# Patient Record
Sex: Male | Born: 1945 | Race: White | Hispanic: No | Marital: Married | Smoking: Former smoker
Health system: Southern US, Community
[De-identification: ages and names within clinical notes are randomized; demographics above are authoritative.]

## PROBLEM LIST (undated history)

## (undated) DIAGNOSIS — M25552 Pain in left hip: Secondary | ICD-10-CM

## (undated) DIAGNOSIS — F411 Generalized anxiety disorder: Secondary | ICD-10-CM

## (undated) DIAGNOSIS — N179 Acute kidney failure, unspecified: Secondary | ICD-10-CM

## (undated) DIAGNOSIS — R7302 Impaired glucose tolerance (oral): Secondary | ICD-10-CM

## (undated) DIAGNOSIS — I69154 Hemiplegia and hemiparesis following nontraumatic intracerebral hemorrhage affecting left non-dominant side: Secondary | ICD-10-CM

## (undated) DIAGNOSIS — H918X9 Other specified hearing loss, unspecified ear: Secondary | ICD-10-CM

## (undated) DIAGNOSIS — F329 Major depressive disorder, single episode, unspecified: Secondary | ICD-10-CM

## (undated) DIAGNOSIS — I10 Essential (primary) hypertension: Secondary | ICD-10-CM

## (undated) DIAGNOSIS — K635 Polyp of colon: Secondary | ICD-10-CM

## (undated) DIAGNOSIS — R0989 Other specified symptoms and signs involving the circulatory and respiratory systems: Secondary | ICD-10-CM

## (undated) DIAGNOSIS — R9431 Abnormal electrocardiogram [ECG] [EKG]: Secondary | ICD-10-CM

## (undated) DIAGNOSIS — M19042 Primary osteoarthritis, left hand: Secondary | ICD-10-CM

## (undated) DIAGNOSIS — F32A Depression, unspecified: Secondary | ICD-10-CM

## (undated) DIAGNOSIS — G890 Central pain syndrome: Secondary | ICD-10-CM

## (undated) DIAGNOSIS — E7800 Pure hypercholesterolemia, unspecified: Secondary | ICD-10-CM

## (undated) DIAGNOSIS — Z86010 Personal history of colonic polyps: Secondary | ICD-10-CM

## (undated) DIAGNOSIS — M25532 Pain in left wrist: Secondary | ICD-10-CM

## (undated) DIAGNOSIS — H918X3 Other specified hearing loss, bilateral: Secondary | ICD-10-CM

## (undated) DIAGNOSIS — F419 Anxiety disorder, unspecified: Secondary | ICD-10-CM

## (undated) DIAGNOSIS — M6530 Trigger finger, unspecified finger: Secondary | ICD-10-CM

## (undated) DIAGNOSIS — Z860101 Personal history of adenomatous and serrated colon polyps: Secondary | ICD-10-CM

## (undated) DIAGNOSIS — M720 Palmar fascial fibromatosis [Dupuytren]: Secondary | ICD-10-CM

## (undated) HISTORY — DX: Personal history of colonic polyps: Z86.010

## (undated) HISTORY — DX: Other specified hearing loss, bilateral: H91.8X3

## (undated) HISTORY — DX: Essential (primary) hypertension: I10

## (undated) HISTORY — DX: Palmar fascial fibromatosis (dupuytren): M72.0

## (undated) HISTORY — DX: Pain in left hip: M25.552

## (undated) HISTORY — DX: Pain in left wrist: M25.532

## (undated) HISTORY — DX: Trigger finger, unspecified finger: M65.30

## (undated) HISTORY — DX: Impaired glucose tolerance (oral): R73.02

## (undated) HISTORY — DX: Hemiplegia and hemiparesis following nontraumatic intracerebral hemorrhage affecting left non-dominant side: I69.154

## (undated) HISTORY — DX: Primary osteoarthritis, left hand: M19.042

## (undated) HISTORY — DX: Abnormal electrocardiogram (ECG) (EKG): R94.31

## (undated) HISTORY — DX: Central pain syndrome: G89.0

## (undated) HISTORY — DX: Other specified hearing loss, unspecified ear: H91.8X9

## (undated) HISTORY — DX: Pure hypercholesterolemia, unspecified: E78.00

## (undated) HISTORY — DX: Other specified symptoms and signs involving the circulatory and respiratory systems: R09.89

## (undated) HISTORY — DX: Personal history of adenomatous and serrated colon polyps: Z86.0101

## (undated) HISTORY — DX: Acute kidney failure, unspecified: N17.9

## (undated) HISTORY — DX: Generalized anxiety disorder: F41.1

---

## 2017-12-31 ENCOUNTER — Encounter (HOSPITAL_COMMUNITY): Payer: Self-pay | Admitting: *Deleted

## 2017-12-31 ENCOUNTER — Inpatient Hospital Stay (HOSPITAL_COMMUNITY)
Admission: AD | Admit: 2017-12-31 | Discharge: 2018-01-01 | DRG: 920 | Disposition: A | Discharge status: Home / Self Care | Payer: Medicare Other | Source: Other Acute Inpatient Hospital | Attending: Pulmonary Disease | Admitting: Pulmonary Disease

## 2017-12-31 ENCOUNTER — Other Ambulatory Visit: Payer: Self-pay

## 2017-12-31 DIAGNOSIS — K921 Melena: Secondary | ICD-10-CM | POA: Diagnosis present

## 2017-12-31 DIAGNOSIS — E876 Hypokalemia: Secondary | ICD-10-CM | POA: Diagnosis present

## 2017-12-31 DIAGNOSIS — K91840 Postprocedural hemorrhage and hematoma of a digestive system organ or structure following a digestive system procedure: Principal | ICD-10-CM | POA: Diagnosis present

## 2017-12-31 DIAGNOSIS — W19XXXA Unspecified fall, initial encounter: Secondary | ICD-10-CM | POA: Diagnosis present

## 2017-12-31 DIAGNOSIS — Y838 Other surgical procedures as the cause of abnormal reaction of the patient, or of later complication, without mention of misadventure at the time of the procedure: Secondary | ICD-10-CM | POA: Diagnosis not present

## 2017-12-31 DIAGNOSIS — D62 Acute posthemorrhagic anemia: Secondary | ICD-10-CM | POA: Diagnosis not present

## 2017-12-31 DIAGNOSIS — Z87891 Personal history of nicotine dependence: Secondary | ICD-10-CM | POA: Diagnosis not present

## 2017-12-31 DIAGNOSIS — K922 Gastrointestinal hemorrhage, unspecified: Secondary | ICD-10-CM

## 2017-12-31 DIAGNOSIS — R578 Other shock: Secondary | ICD-10-CM | POA: Diagnosis not present

## 2017-12-31 DIAGNOSIS — E871 Hypo-osmolality and hyponatremia: Secondary | ICD-10-CM | POA: Diagnosis not present

## 2017-12-31 DIAGNOSIS — I10 Essential (primary) hypertension: Secondary | ICD-10-CM | POA: Diagnosis present

## 2017-12-31 DIAGNOSIS — Z888 Allergy status to other drugs, medicaments and biological substances status: Secondary | ICD-10-CM | POA: Diagnosis not present

## 2017-12-31 DIAGNOSIS — Z79899 Other long term (current) drug therapy: Secondary | ICD-10-CM | POA: Diagnosis not present

## 2017-12-31 DIAGNOSIS — R55 Syncope and collapse: Secondary | ICD-10-CM | POA: Diagnosis not present

## 2017-12-31 DIAGNOSIS — E785 Hyperlipidemia, unspecified: Secondary | ICD-10-CM | POA: Diagnosis not present

## 2017-12-31 DIAGNOSIS — R739 Hyperglycemia, unspecified: Secondary | ICD-10-CM | POA: Diagnosis not present

## 2017-12-31 DIAGNOSIS — F419 Anxiety disorder, unspecified: Secondary | ICD-10-CM | POA: Diagnosis present

## 2017-12-31 DIAGNOSIS — S0181XA Laceration without foreign body of other part of head, initial encounter: Secondary | ICD-10-CM | POA: Diagnosis not present

## 2017-12-31 DIAGNOSIS — I615 Nontraumatic intracerebral hemorrhage, intraventricular: Secondary | ICD-10-CM | POA: Diagnosis not present

## 2017-12-31 DIAGNOSIS — I161 Hypertensive emergency: Secondary | ICD-10-CM | POA: Diagnosis not present

## 2017-12-31 DIAGNOSIS — I639 Cerebral infarction, unspecified: Secondary | ICD-10-CM | POA: Diagnosis not present

## 2017-12-31 DIAGNOSIS — I610 Nontraumatic intracerebral hemorrhage in hemisphere, subcortical: Secondary | ICD-10-CM | POA: Diagnosis not present

## 2017-12-31 DIAGNOSIS — F329 Major depressive disorder, single episode, unspecified: Secondary | ICD-10-CM | POA: Diagnosis not present

## 2017-12-31 DIAGNOSIS — E079 Disorder of thyroid, unspecified: Secondary | ICD-10-CM | POA: Diagnosis not present

## 2017-12-31 HISTORY — DX: Anxiety disorder, unspecified: F41.9

## 2017-12-31 HISTORY — DX: Depression, unspecified: F32.A

## 2017-12-31 HISTORY — DX: Major depressive disorder, single episode, unspecified: F32.9

## 2017-12-31 HISTORY — DX: Gastrointestinal hemorrhage, unspecified: K92.2

## 2017-12-31 HISTORY — DX: Polyp of colon: K63.5

## 2017-12-31 LAB — BASIC METABOLIC PANEL
ANION GAP: 10 (ref 5–15)
BUN: 20 mg/dL (ref 6–20)
CALCIUM: 7.9 mg/dL — AB (ref 8.9–10.3)
CO2: 22 mmol/L (ref 22–32)
Chloride: 105 mmol/L (ref 101–111)
Creatinine, Ser: 1.07 mg/dL (ref 0.61–1.24)
GFR calc non Af Amer: >60 mL/min (ref >60)
Glucose, Bld: 122 mg/dL — ABNORMAL HIGH (ref 65–99)
Potassium: 3.6 mmol/L (ref 3.5–5.1)
SODIUM: 137 mmol/L (ref 135–145)

## 2017-12-31 LAB — URINALYSIS, ROUTINE W REFLEX MICROSCOPIC
Bacteria, UA: NONE SEEN
Bilirubin Urine: NEGATIVE
Glucose, UA: NEGATIVE mg/dL
Ketones, ur: NEGATIVE mg/dL
Leukocytes, UA: NEGATIVE
NITRITE: NEGATIVE
Protein, ur: NEGATIVE mg/dL
RBC / HPF: NONE SEEN RBC/hpf (ref 0–5)
SPECIFIC GRAVITY, URINE: 1.031 — AB (ref 1.005–1.030)
WBC UA: NONE SEEN WBC/hpf (ref 0–5)
pH: 7 (ref 5.0–8.0)

## 2017-12-31 LAB — CBC
HCT: 31.1 % — ABNORMAL LOW (ref 39.0–52.0)
HCT: 30.3 % — ABNORMAL LOW (ref 39.0–52.0)
HEMOGLOBIN: 10.2 g/dL — AB (ref 13.0–17.0)
Hemoglobin: 10.3 g/dL — ABNORMAL LOW (ref 13.0–17.0)
MCH: 29 pg (ref 26.0–34.0)
MCH: 30 pg (ref 26.0–34.0)
MCHC: 32.8 g/dL (ref 30.0–36.0)
MCHC: 34 g/dL (ref 30.0–36.0)
MCV: 88.4 fL (ref 78.0–100.0)
MCV: 88.3 fL (ref 78.0–100.0)
Platelets: 269 10*3/uL (ref 150–400)
Platelets: 277 10*3/uL (ref 150–400)
RBC: 3.52 MIL/uL — AB (ref 4.22–5.81)
RBC: 3.43 MIL/uL — ABNORMAL LOW (ref 4.22–5.81)
RDW: 13.3 % (ref 11.5–15.5)
RDW: 13.6 % (ref 11.5–15.5)
WBC: 7.2 10*3/uL (ref 4.0–10.5)
WBC: 6.9 10*3/uL (ref 4.0–10.5)

## 2017-12-31 LAB — MRSA PCR SCREENING: MRSA BY PCR: NEGATIVE

## 2017-12-31 MED ORDER — SODIUM CHLORIDE 0.9 % IV SOLN
INTRAVENOUS | Status: DC
  Administered 2017-12-31: 13:00:00 via INTRAVENOUS

## 2017-12-31 MED ORDER — ACETAMINOPHEN 325 MG PO TABS: 650.0000 mg | ORAL_TABLET | Freq: Four times a day (QID) | ORAL | Status: DC | PRN

## 2017-12-31 MED ORDER — LORAZEPAM 1 MG PO TABS: 1.0000 mg | ORAL_TABLET | Freq: Four times a day (QID) | ORAL | Status: DC | PRN

## 2017-12-31 MED ORDER — ONDANSETRON HCL 4 MG/2ML IJ SOLN: 4.0000 mg | Freq: Four times a day (QID) | INTRAMUSCULAR | Status: DC | PRN

## 2017-12-31 MED ORDER — SODIUM CHLORIDE 0.9 % IV SOLN: 250.0000 mL | INTRAVENOUS | Status: DC | PRN

## 2017-12-31 MED ORDER — PANTOPRAZOLE SODIUM 40 MG PO TBEC
40.0000 mg | DELAYED_RELEASE_TABLET | Freq: Every day | ORAL | Status: DC
  Administered 2017-12-31: 40 mg via ORAL
  Filled 2017-12-31: qty 1

## 2017-12-31 MED ORDER — VENLAFAXINE HCL ER 150 MG PO CP24
300.0000 mg | ORAL_CAPSULE | Freq: Every day | ORAL | Status: DC
  Administered 2017-12-31: 300 mg via ORAL
  Filled 2017-12-31 (×2): qty 2

## 2017-12-31 MED ORDER — ALBUTEROL SULFATE (2.5 MG/3ML) 0.083% IN NEBU: 2.5000 mg | INHALATION_SOLUTION | RESPIRATORY_TRACT | Status: DC | PRN

## 2017-12-31 MED ORDER — DOUBLE ANTIBIOTIC 500-10000 UNIT/GM EX OINT
TOPICAL_OINTMENT | Freq: Two times a day (BID) | CUTANEOUS | Status: DC
  Administered 2017-12-31: 1 via TOPICAL
  Administered 2018-01-01: 06:00:00 via TOPICAL
  Filled 2017-12-31: qty 28.4

## 2017-12-31 MED ORDER — BUPROPION HCL ER (XL) 300 MG PO TB24
300.0000 mg | ORAL_TABLET | Freq: Every day | ORAL | Status: DC
  Administered 2017-12-31: 300 mg via ORAL
  Filled 2017-12-31 (×2): qty 1

## 2017-12-31 NOTE — H&P (Addendum)
PULMONARY / CRITICAL CARE MEDICINE   Name: Patrick Pollard MRN: [id number] DOB: December 20, 1945    ADMISSION DATE:  12/31/2017 CONSULTATION DATE:  12/31/17  REFERRING MD:  [facility]   CHIEF COMPLAINT:  Bloody BM, Syncopal Episode   HISTORY OF PRESENT ILLNESS:   72 y/o M, former smoker,  who presented to [facility] on 3/24 after a syncopal episode and large bloody bowel movement.   On 3/13, the patient underwent a colonoscopy at the [facility] with polyp removal.  Post procedure, he had been giving himself enemas for BM, most recent the day of presentation.  He got up to go to the restroom during the night and passed out face first hitting his head on the ground.  He then proceeded to have a large bloody BM > EMS estimated approximately 1-2L blood on the floor (??).  He was awake / alert on presentation. Evaluation in the ER there included a CT of the head / neck which was negative.  In addition, he had a CT of the ABD/Pelvis w/wo contrast that showed no active extravasation to localize site of GIB, mild aortic atherosclerosis without dissection.  Mild colonic wall thickening of the ascending and splenic flexure of the colon suggesting mild colitis, mild urinary bladder wall thickening which may be chronic secondary to prostatic hypertrophy or seen with UTI.  The patients Hgb was 10.6 on arrival.  He was normotensive and not tachycardic.  His facial lacerations were sutured at [facility]. He was given 1 unit PRBC.  The patient was transferred to [facility] ICU for further evaluation.   PAST MEDICAL HISTORY :  He  has no past medical history on file.  PAST SURGICAL HISTORY: He  has no past surgical history on file.  No Known Allergies  No current facility-administered medications on file prior to encounter.    No current outpatient medications on file prior to encounter.    FAMILY HISTORY:  His has no family status information on file.    SOCIAL HISTORY: He    REVIEW OF SYSTEMS:   POSITIVES IN BOLD Gen: Denies fever, chills, weight change, fatigue, night sweats HEENT: Denies blurred vision, double vision, hearing loss, tinnitus, sinus congestion, rhinorrhea, sore throat, neck stiffness, dysphagia PULM: Denies shortness of breath, cough, sputum production, hemoptysis, wheezing CV: Denies chest pain, edema, orthopnea, paroxysmal nocturnal dyspnea, palpitations.  Syncope x2  GI: Denies abdominal pain, nausea, vomiting, diarrhea, hematochezia, melena, constipation, change in bowel habits.  Estimated 1-2L blood loss, pt unsure if bright red or dark but states it had large clots in the blood. GU: Denies dysuria, hematuria, polyuria, oliguria, urethral discharge Endocrine: Denies hot or cold intolerance, polyuria, polyphagia or appetite change Derm: Denies rash, dry skin, scaling or peeling skin change Heme: Denies easy bruising, bleeding, bleeding gums Neuro: Denies headache, numbness, weakness, slurred speech, loss of memory or consciousness   SUBJECTIVE:  Pt denies abd pain, SOB.  No further bleeding.  VITAL SIGNS: There were no vitals taken for this visit.  HEMODYNAMICS:    VENTILATOR SETTINGS:    INTAKE / OUTPUT: No intake/output data recorded.  PHYSICAL EXAMINATION: General:  Elderly male in NAD, lying in bed Neuro:  AAOx4, speech clear, MAE HEENT:  MM pink / moist, large beard  Cardiovascular:  s1s2 rrr, no m/r/g Lungs:  Even/non-labored, lungs bilaterally clear  Abdomen:  Soft, non-tender, bsx4 active  Musculoskeletal:  No acute deformities  Skin:  Warm/dry.  Pale palms of hands.  Facial laceration on R cheek sutured /  clean dry.  Laceration on R eyebrow sutured c/d/i  LABS:  BMET No results for input(s): NA, K, CL, CO2, BUN, CREATININE, GLUCOSE in the last 168 hours.  Electrolytes No results for input(s): CALCIUM, MG, PHOS in the last 168 hours.  CBC No results for input(s): WBC, HGB, HCT, PLT in the last 168 hours.  Coag's No results for  input(s): APTT, INR in the last 168 hours.  Sepsis Markers No results for input(s): LATICACIDVEN, PROCALCITON, O2SATVEN in the last 168 hours.  ABG No results for input(s): PHART, PCO2ART, PO2ART in the last 168 hours.  Liver Enzymes No results for input(s): AST, ALT, ALKPHOS, BILITOT, ALBUMIN in the last 168 hours.  Cardiac Enzymes No results for input(s): TROPONINI, PROBNP in the last 168 hours.  Glucose No results for input(s): GLUCAP in the last 168 hours.  Imaging No results found.   STUDIES:  CT ABD/Pelvis w/wo 3/24 [facility]) >> no active extravasation to localize site of GIB, mild aortic atherosclerosis without dissection.  Mild colonic wall thickening of the ascending and splenic flexure of the colon suggesting mild colitis, mild urinary bladder wall thickening which may be chronic secondary to prostatic hypertrophy or seen with UTI CT Head / Cervical Spine 3/24 [facility]) >> no acute abnormality   CULTURES:   ANTIBIOTICS:   SIGNIFICANT EVENTS: 3/24  Admit post syncopal episode and GIB  LINES/TUBES:   DISCUSSION: 72 y/o M admitted with GIB, syncope.  Recent polypectomy at the [place] [facility] [place].    ASSESSMENT / PLAN:  PULMONARY A: Former Smoker  P:   PRN albuterol  O2 if needed for sats 90-95%  CARDIOVASCULAR A:  Syncope - secondary to GIB / anemia  P:  ICU monitoring   RENAL A:   Hyponatremia  Hypokalemia  P:   NS at [facility]  Trend BMP / urinary output Replace electrolytes as indicated Avoid nephrotoxic agents, ensure adequate renal perfusion  GASTROINTESTINAL A:   GIB - suspect secondary to recent polypectomy  P:   GI consulted, appreciate input  Monitor for further bleeding  NPO x ice chips   HEMATOLOGIC A:   Anemia - secondary to GIB  P:  Trend CBC Monitor for bleeding  SCD's for DVT prophylaxis   ENDOCRINE A:   Hyperglycemia - suspect stress response  P:   Monitor glucose on BMP   INFECTIOUS A: Thickened Bladder Wall -  noted on CT ABD, ? UTI vs BPH P: Assess UA   INTEGUMENT A: Facial Laceration x2 P: Bacitracin to eye / cheek site Sutures to be removed in 7-10 days if not dissolved    FAMILY  - Updates: Patient updated on plan of care at bedside.   - Global:  Transfer to [facility] as of 3/25 am.  Canary Brim, NP-C [facility] Pgr: [phone] or if no answer [phone] 12/31/2017, 9:42 AM   Independently examined pt, evaluated data & formulated above care plan with NP/resident   72 year old man underwent polypectomy on 3/13 by colonoscopy at [facility], admitted while at [facility] ER for lower GI bleed described as 1-2 L of blood on the floor by EMS and he had a presyncopal episode. Transfuse 1 unit of PRBC at [facility], hemoglobin is 10.2 He appears medically stable. CT abdomen reviewed and does not show any active extravasation or no other explanation for lower GI bleed.  Impression-appears to be post polypectomy delayed bleed, triggered by enema  GI consult, serial hemoglobins and monitor in the ICU for 24 hours  If rebleed, may have to obtain bleeding scan versus sigmoidoscopy To triad 3/25  Argenis Kumari V. Vassie LollAlva MD

## 2017-12-31 NOTE — Consult Note (Addendum)
Referring Provider:  Dr. Vassie LollAlva Primary Care Physician:  Default, Provider, MD Primary Gastroenterologist:  Dione PloverUnassigned/ [facility]   Reason for Consultation: GI bleed  HPI: Patrick Pollard is a 72 y.o. male with past medical history of hypertension and anxiety [facility] for further evaluation of GI bleed and syncope.GI is consulted for further evaluation  Patient underwent [facility]. Patient subsequently developed constipation requiring use of any masses. Patient had an episode of syncope and fall along with the large amount of rectal bleeding this morning. CBC showed hemoglobin 10.6.CTA abdomen and pelvis showed mild colonic wall thickening of the ascending colon and splenic flexure concerning for mild colitis. No contrast extravasation within the bowel to localize the site of GI bleed.  Patient seen and examined at bedside. Family at bedside. Patient denied any further bleeding episodes. Denies abdominal pain. Complaining of intermittent nausea. Denied any vomiting.  Patient initially had a screening colonoscopy in February 2019. He had a few polyps removed. He was found to have a large polyp and biopsy showed precancerous polyp. Reports not available to review. Patient underwent repeat colonoscopy 10 days ago for EMR of the large polyp.  Past Medical History:  Diagnosis Date  . Anxiety   . Colon polyps    s/p colonoscopy 12/2016 wtih polyp removal   . Depression    PSH - none    FH : Negative for colon cancer and colon polyps  SH : Negative for smoking and alcohol use.   Prior to Admission medications   Medication Sig Start Date End Date Taking? Authorizing Provider  buPROPion (WELLBUTRIN XL) 300 MG 24 hr tablet Take 300 mg by mouth daily.   Yes [provider]  simvastatin (ZOCOR) 10 MG tablet Take 40 mg by mouth daily at 6 PM.    Yes [provider]  triamterene-hydrochlorothiazide  (MAXZIDE-25) 37.5-25 MG tablet Take 1 tablet by mouth daily.   Yes [provider]  venlafaxine XR (EFFEXOR-XR) 150 MG 24 hr capsule Take 300 mg by mouth daily with breakfast.   Yes [provider]  LORazepam (ATIVAN) 1 MG tablet Take 1 mg by mouth every 6 (six) hours as needed for anxiety.    [provider]    Scheduled Meds: . pantoprazole  40 mg Oral Daily  . polymixin-bacitracin   Topical BID   Continuous Infusions: . sodium chloride    . sodium chloride     PRN Meds:.sodium chloride, acetaminophen, albuterol, ondansetron (ZOFRAN) IV  Allergies as of 12/31/2017  . (No Known Allergies)    No family history on file.  Social History   Socioeconomic History  . Marital status: Married    Spouse name: Not on file  . Number of children: Not on file  . Years of education: Not on file  . Highest education level: Not on file  Occupational History  . Not on file  Social Needs  . Financial resource strain: Not on file  . Food insecurity:    Worry: Not on file    Inability: Not on file  . Transportation needs:    Medical: Not on file    Non-medical: Not on file  Tobacco Use  . Smoking status: Former Smoker    Years: 37.00    Types: Cigarettes    Last attempt to quit: 12/31/1996    Years since quitting: 21.0  Substance and Sexual Activity  . Alcohol use: Not on file  . Drug use: Not on  file  . Sexual activity: Not on file  Lifestyle  . Physical activity:    Days per week: Not on file    Minutes per session: Not on file  . Stress: Not on file  Relationships  . Social connections:    Talks on phone: Not on file    Gets together: Not on file    Attends religious service: Not on file    Active member of club or organization: Not on file    Attends meetings of clubs or organizations: Not on file    Relationship status: Not on file  . Intimate partner violence:    Fear of current or ex partner: Not on file    Emotionally abused: Not on file     Physically abused: Not on file    Forced sexual activity: Not on file  Other Topics Concern  . Not on file  Social History Narrative  . Not on file    Review of Systems: Review of Systems  Constitutional: Negative for chills, fever and weight loss.  HENT: Negative for hearing loss and tinnitus.   Eyes: Negative for blurred vision and double vision.  Respiratory: Negative for cough, hemoptysis and sputum production.   Cardiovascular: Negative for chest pain and palpitations.  Gastrointestinal: Positive for blood in stool, constipation and nausea. Negative for abdominal pain, diarrhea and vomiting.  Genitourinary: Negative for dysuria and urgency.  Musculoskeletal: Negative for myalgias and neck pain.  Skin: Negative for itching and rash.  Neurological: Positive for dizziness and loss of consciousness.  Endo/Heme/Allergies: Does not bruise/bleed easily.  Psychiatric/Behavioral: Negative for hallucinations and suicidal ideas.    Physical Exam: Vital signs: Vitals:   12/31/17 1000 12/31/17 1100  BP: (!) 144/90 (!) 127/96  Pulse: 82 79  Resp: (!) 22 (!) 24  Temp:  (!) 97.3 F (36.3 C)  SpO2: 100% 98%   Last BM Date: 12/31/17 Physical Exam  Constitutional: He is oriented to person, place, and time. He appears well-developed and well-nourished. No distress.  HENT:  Mouth/Throat: Oropharynx is clear and moist.  Small laceration on the right side of the face.  Eyes: EOM are normal. No scleral icterus.  Neck: Normal range of motion. Neck supple. No thyromegaly present.  Cardiovascular: Normal rate, regular rhythm and normal heart sounds.  Pulmonary/Chest: Effort normal and breath sounds normal. No respiratory distress.  Abdominal: Soft. Bowel sounds are normal. He exhibits no distension. There is no tenderness. There is no rebound and no guarding.  Musculoskeletal: Normal range of motion. He exhibits no edema.  Neurological: He is alert and oriented to person, place, and time.   Skin: Skin is warm. No erythema.  Psychiatric: He has a normal mood and affect. Judgment and thought content normal.  Vitals reviewed.  GI:  Lab Results: No results for input(s): WBC, HGB, HCT, PLT in the last 72 hours. BMET No results for input(s): NA, K, CL, CO2, GLUCOSE, BUN, CREATININE, CALCIUM in the last 72 hours. LFT No results for input(s): PROT, ALBUMIN, AST, ALT, ALKPHOS, BILITOT, BILIDIR, IBILI in the last 72 hours. PT/INR No results for input(s): LABPROT, INR in the last 72 hours.   Studies/Results: No results found.  Impression/Plan: - rectal bleeding with syncope. Most likely post-polypectomy bleeding. - Acute blood loss anemia. Status post blood transfusion. - CTA abdomen and pelvis showed mild colonic wall thickening of the ascending colon and splenic flexure concerning for mild colitis.   Recommendations -------------------------- Patient initially had a screening colonoscopy in February 2019.  He had a few polyps removed. He was found to have a large polyp and biopsy showed precancerous polyp.  Patient underwent repeat colonoscopy 10 days ago for EMR of the large polyp.Reports not available to review.   presentation is consistent with polypectomy bleeding. He denied any further bleeding since admission here. His blood pressure is improved. Denied any dizziness. records from [facility] reviewed. - follow blood work - If remains hemodynamically stable and no further bleeding, hopefully we can discharge him tomorrow. - Patient has follow-up at [facility] on 01/04/2018.  - GI will follow    LOS: 0 days   Kathi Der  MD, FACP 12/31/2017, 11:36 AM  Contact #  [phone]

## 2018-01-01 LAB — BASIC METABOLIC PANEL
ANION GAP: 9 (ref 5–15)
BUN: 12 mg/dL (ref 6–20)
CALCIUM: 8.1 mg/dL — AB (ref 8.9–10.3)
CO2: 27 mmol/L (ref 22–32)
CREATININE: 0.97 mg/dL (ref 0.61–1.24)
Chloride: 103 mmol/L (ref 101–111)
Glucose, Bld: 112 mg/dL — ABNORMAL HIGH (ref 65–99)
Potassium: 3.1 mmol/L — ABNORMAL LOW (ref 3.5–5.1)
Sodium: 139 mmol/L (ref 135–145)

## 2018-01-01 LAB — CBC
HEMATOCRIT: 28.7 % — AB (ref 39.0–52.0)
Hemoglobin: 9.8 g/dL — ABNORMAL LOW (ref 13.0–17.0)
MCH: 30.4 pg (ref 26.0–34.0)
MCHC: 34.1 g/dL (ref 30.0–36.0)
MCV: 89.1 fL (ref 78.0–100.0)
Platelets: 247 10*3/uL (ref 150–400)
RBC: 3.22 MIL/uL — ABNORMAL LOW (ref 4.22–5.81)
RDW: 13.8 % (ref 11.5–15.5)
WBC: 5.9 10*3/uL (ref 4.0–10.5)

## 2018-01-01 NOTE — Progress Notes (Signed)
[facility] Progress Note  Patrick LevyBenjamin Pollard 72 y.o. 1946/03/06  CC:  GI bleed, rectal bleeding   Subjective: No further bleeding episodes. Patient remains asymptomatic from GI standpoint. Denied abdominal pain, nausea and vomiting.  ROS : negative for chest pain and shortness of breath.   Objective: Vital signs in last 24 hours: Vitals:   01/01/18 0500 01/01/18 0600  BP: 110/76 113/67  Pulse: 76 76  Resp: 20 19  Temp:    SpO2: 96% 96%    Physical Exam: Constitutional: He is oriented to person, place, and time. He appears well-developed and well-nourished. No distress.  HENT: Small laceration on the right side of the face.  Eyes: EOM are normal. No scleral icterus.  Neck: Normal range of motion. Neck supple. No thyromegaly present.  Cardiovascular: Normal rate, regular rhythm and normal heart sounds.  Pulmonary/Chest: Effort normal and breath sounds normal. No respiratory distress.  Abdominal: Soft. Bowel sounds are normal. He exhibits no distension. There is no tenderness. There is no rebound and no guarding.  Psychiatric: He has a normal mood and affect. Judgment and thought content normal.  Vitals reviewed.    Lab Results: Recent Labs    12/31/17 1119 01/01/18 0410  NA 137 139  K 3.6 3.1*  CL 105 103  CO2 22 27  GLUCOSE 122* 112*  BUN 20 12  CREATININE 1.07 0.97  CALCIUM 7.9* 8.1*   No results for input(s): AST, ALT, ALKPHOS, BILITOT, PROT, ALBUMIN in the last 72 hours. Recent Labs    12/31/17 1834 01/01/18 0410  WBC 6.9 5.9  HGB 10.3* 9.8*  HCT 30.3* 28.7*  MCV 88.3 89.1  PLT 277 247   No results for input(s): LABPROT, INR in the last 72 hours.    Assessment/Plan: - rectal bleeding with syncope. Most likely post-polypectomy bleeding.resolved now. No further bleeding episode.  Patient initially had a screening colonoscopy in February 2019. He had a few polyps removed. He was found to have a large polyp and biopsy showed precancerous  polyp.  Patient underwent repeat colonoscopy 10 days ago for EMR of the large polyp.Reports not available to review.   - Acute blood loss anemia. Status post blood transfusion. - CTA abdomen and pelvis showed mild colonic wall thickening of the ascending colon and splenic flexure concerning for mild colitis.   Recommendations -------------------------- - patient denied any further bleeding episode. Hemoglobin relatively stable. Small drop today could be dilutional as all 3 cells lines are decreased - No plan for any inpatient GI workup. - Okay to discharge from GI standpoint. GI will sign off. - Patient [facility] on 01/04/2018.    Kathi DerParag Tregan Read MD, FACP 01/01/2018, 8:45 AM  Contact #  [phone]

## 2018-01-01 NOTE — Progress Notes (Signed)
Dr. Levora AngelBrahmbhatt consulted r/t diet; pt ok for diet advance per GI perspective.

## 2018-01-01 NOTE — Discharge Summary (Addendum)
Physician Discharge Summary  Patient ID: Patrick Pollard MRN: [id number] DOB/AGE: 1945/10/26 72 y.o.  Admit date: 12/31/2017 Discharge date: 01/01/2018    Discharge Diagnoses:  Active Problems:   GIB (gastrointestinal bleeding)                                                       D/c plan by Discharge Diagnosis  Rectal bleeding with syncope - resolved.  R/t recent polypectomy triggered by enema.  CT abdomen without active extravasation or no other explanation for lower GI bleed. Anemia - improved.  Received 1 unit PRBC.  HGb stable.  PLAN -  D/c home  PO diet as tolerated  outpt GI f/u as previously scheduled  Return to ER if further s/s bleeding  Hold home maxzide and f/u with PCP  Discussed reasons to return No enemas  Take it easy until seen in GI clinic  F/u CBC on outpt GI f/u   Facial Laceration x2 P: Bacitracin to eye / cheek site Sutures to be removed in 7-10 days if not dissolved  PCP f/u    Brief Summary: 72 y/o M, former smoker,  who presented to [facility] on 3/24 after a syncopal episode and large bloody bowel movement.   On 3/13, the patient underwent a colonoscopy at the [facility] with polyp removal.  Post procedure, he had been giving himself enemas for BM, most recent the day of presentation.  He got up to go to the restroom during the night and passed out face first hitting his head on the ground.  He then proceeded to have a large bloody BM > EMS estimated approximately 1-2L blood on the floor (??).  He was awake / alert on presentation. Evaluation in the ER there included a CT of the head / neck which was negative.  In addition, he had a CT of the ABD/Pelvis w/wo contrast that showed no active extravasation to localize site of GIB, mild aortic atherosclerosis without dissection.  Mild colonic wall thickening of the ascending and splenic flexure of the colon suggesting mild colitis, mild urinary bladder wall thickening which may be chronic secondary  to prostatic hypertrophy or seen with UTI.  The patients Hgb was 10.6 on arrival.  He was normotensive and not tachycardic.  His facial lacerations were sutured at [facility]. He was given 1 unit PRBC.  The patient was transferred to [facility] ICU for further evaluation.   On 3/25 No further bleeding, hgb stable.  Seen in consultation by GI who felt bleeding was r/t recent polypectomy and is now resolved.  No further GI issues.  Tolerating PO diet. They recommend d/c home and outpt GI f/u as previously scheduled.   Consults: GI   Lines/tubes: None    STUDIES:  CT ABD/Pelvis w/wo 3/24 [facility]) >> no active extravasation to localize site of GIB, mild aortic atherosclerosis without dissection.  Mild colonic wall thickening of the ascending and splenic flexure of the colon suggesting mild colitis, mild urinary bladder wall thickening which may be chronic secondary to prostatic hypertrophy or seen with UTI CT Head / Cervical Spine 3/24 [facility]) >> no acute abnormality     Vitals:   01/01/18 0700 01/01/18 0800 01/01/18 0900 01/01/18 1000  BP: 122/76 130/82 130/78   Pulse: 76 77 74 79  Resp: 19 (!) 22 19 (!)  26  Temp:      TempSrc:      SpO2: 96% 95% 100% 100%  Weight:      Height:         Discharge Labs  BMET Recent Labs  Lab 12/31/17 1119 01/01/18 0410  NA 137 139  K 3.6 3.1*  CL 105 103  CO2 22 27  GLUCOSE 122* 112*  BUN 20 12  CREATININE 1.07 0.97  CALCIUM 7.9* 8.1*     CBC  Recent Labs  Lab 12/31/17 1119 12/31/17 1834 01/01/18 0410  HGB 10.2* 10.3* 9.8*  HCT 31.1* 30.3* 28.7*  WBC 7.2 6.9 5.9  PLT 269 277 247   Anti-Coagulation No results for input(s): INR in the last 168 hours.       Follow-up Information    [facility]2018.            Allergies as of 01/01/2018      Reactions   Rocephin [ceftriaxone] Other (See Comments)   Light headed, and nearly passed out      Medication List    STOP taking these medications    triamterene-hydrochlorothiazide 37.5-25 MG tablet Commonly known as:  MAXZIDE-25     TAKE these medications   buPROPion 300 MG 24 hr tablet Commonly known as:  WELLBUTRIN XL Take 300 mg by mouth daily.   LORazepam 1 MG tablet Commonly known as:  ATIVAN Take 1 mg by mouth every 6 (six) hours as needed for anxiety.   simvastatin 40 MG tablet Commonly known as:  ZOCOR Take 40 mg by mouth daily at 6 PM.   tamsulosin 0.4 MG Caps capsule Commonly known as:  FLOMAX Take 0.4 mg by mouth at bedtime.   venlafaxine XR 150 MG 24 hr capsule Commonly known as:  EFFEXOR-XR Take 300 mg by mouth daily with breakfast.         Disposition:  Home   Discharged Condition: Patrick Pollard has met maximum benefit of inpatient care and is medically stable and cleared for discharge.  Patient is pending follow up as above.      Time spent on disposition:  Greater than 35 minutes.   SignedNickolas Madrid, NP 01/01/2018  11:14 AM Pager: [phone] or [phone]

## 2018-10-02 DIAGNOSIS — E782 Mixed hyperlipidemia: Secondary | ICD-10-CM

## 2018-10-02 DIAGNOSIS — R7303 Prediabetes: Secondary | ICD-10-CM

## 2018-10-02 HISTORY — DX: Mixed hyperlipidemia: E78.2

## 2018-10-02 HISTORY — DX: Prediabetes: R73.03

## 2018-11-28 DIAGNOSIS — N5201 Erectile dysfunction due to arterial insufficiency: Secondary | ICD-10-CM

## 2018-11-28 HISTORY — DX: Erectile dysfunction due to arterial insufficiency: N52.01

## 2018-12-04 ENCOUNTER — Encounter (HOSPITAL_COMMUNITY): Payer: Self-pay | Admitting: Psychiatry

## 2018-12-04 ENCOUNTER — Ambulatory Visit (INDEPENDENT_AMBULATORY_CARE_PROVIDER_SITE_OTHER): Payer: Medicare Other | Admitting: Psychiatry

## 2018-12-04 VITALS — BP 177/77 | HR 90 | Ht 73.0 in | Wt 223.0 lb

## 2018-12-04 DIAGNOSIS — F331 Major depressive disorder, recurrent, moderate: Secondary | ICD-10-CM | POA: Diagnosis not present

## 2018-12-04 DIAGNOSIS — F4312 Post-traumatic stress disorder, chronic: Secondary | ICD-10-CM | POA: Diagnosis not present

## 2018-12-04 DIAGNOSIS — F329 Major depressive disorder, single episode, unspecified: Secondary | ICD-10-CM | POA: Insufficient documentation

## 2018-12-04 DIAGNOSIS — F330 Major depressive disorder, recurrent, mild: Secondary | ICD-10-CM | POA: Insufficient documentation

## 2018-12-04 HISTORY — DX: Major depressive disorder, single episode, unspecified: F32.9

## 2018-12-04 MED ORDER — ARIPIPRAZOLE 2 MG PO TABS: 2.0000 mg | ORAL_TABLET | Freq: Every day | ORAL | 0 refills | Status: DC
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
## 2018-12-05 ENCOUNTER — Other Ambulatory Visit (HOSPITAL_COMMUNITY): Payer: Self-pay

## 2018-12-05 MED ORDER — ARIPIPRAZOLE 2 MG PO TABS: 2.0000 mg | ORAL_TABLET | Freq: Every day | ORAL | 0 refills | Status: DC

## 2018-12-25 ENCOUNTER — Ambulatory Visit (INDEPENDENT_AMBULATORY_CARE_PROVIDER_SITE_OTHER): Payer: Medicare Other | Admitting: Psychiatry

## 2018-12-25 ENCOUNTER — Encounter (HOSPITAL_COMMUNITY): Payer: Self-pay | Admitting: Psychiatry

## 2018-12-25 ENCOUNTER — Other Ambulatory Visit: Payer: Self-pay

## 2018-12-25 VITALS — BP 169/96 | Ht 73.0 in | Wt 224.0 lb

## 2018-12-25 DIAGNOSIS — F4310 Post-traumatic stress disorder, unspecified: Secondary | ICD-10-CM | POA: Insufficient documentation

## 2018-12-25 DIAGNOSIS — F4312 Post-traumatic stress disorder, chronic: Secondary | ICD-10-CM | POA: Insufficient documentation

## 2018-12-25 DIAGNOSIS — F330 Major depressive disorder, recurrent, mild: Secondary | ICD-10-CM | POA: Diagnosis not present

## 2018-12-25 HISTORY — DX: Post-traumatic stress disorder, unspecified: F43.10

## 2018-12-25 MED ORDER — ARIPIPRAZOLE 5 MG PO TABS: 5.0000 mg | ORAL_TABLET | Freq: Every day | ORAL | 0 refills | Status: DC

## 2018-12-25 NOTE — Progress Notes (Signed)
BH MD/PA/NP OP Progress Note  12/25/2018 3:37 PM Patrick Pollard  MRN:  [id number]  Chief Complaint: "I am much better".  HPI: 73 yo married male, [place] war veteran, who presents with chronic major depression which severity waxes and weans but as far as he can tel it has never remitted. He has been on 300 mg dose of xr venlafaxine for many years. He has a hx of si but not recent. He is somewhat dispirited that his depression never seems to remit and is seeking other treatment options. He does not wish to change antidepressants (has not tried Trintellix, Viibryd, [place]) but is open to a trial of Abilify to augment his Effexor. He has been expressing interest in trying ECT for some time. We have added 2 mg of aripiprazole to venlafaxine and Donique noticed almost immediate improvement in mood. He tolerates it well and is interested in possibly increasing the dose more so depression may fully resolve. He has lorazepam prn anxiety but seldom uses it. His sleep and appetite are good.  Visit Diagnosis:    ICD-10-CM   1. Mild episode of recurrent major depressive disorder (HCC) F33.0   2. Chronic post-traumatic stress disorder (PTSD) F43.12     Past Psychiatric History: Please refer to original H&P.  Past Medical History:  Past Medical History:  Diagnosis Date  . Anxiety   . Colon polyps    s/p colonoscopy 12/2016 wtih polyp removal   . Depression    History reviewed. No pertinent surgical history.  Family Psychiatric History: No psychiatric history in family.  Family History: History reviewed. No pertinent family history.  Social History:  Social History   Socioeconomic History  . Marital status: Married    Spouse name: Not on file  . Number of children: 3  . Years of education: Not on file  . Highest education level: Some college, no degree  Occupational History  . Not on file  Social Needs  . Financial resource strain: Not hard at all  . Food insecurity:    Worry: Never  true    Inability: Never true  . Transportation needs:    Medical: No    Non-medical: Not on file  Tobacco Use  . Smoking status: Former Smoker    Years: 37.00    Types: Cigarettes    Last attempt to quit: 12/31/1996    Years since quitting: 21.9  . Smokeless tobacco: Never Used  Substance and Sexual Activity  . Alcohol use: Not Currently  . Drug use: Never  . Sexual activity: Not on file  Lifestyle  . Physical activity:    Days per week: Not on file    Minutes per session: Not on file  . Stress: Only a little  Relationships  . Social connections:    Talks on phone: Not on file    Gets together: Not on file    Attends religious service: More than 4 times per year    Active member of club or organization: No    Attends meetings of clubs or organizations: Never    Relationship status: Married  Other Topics Concern  . Not on file  Social History Narrative  . Not on file    Allergies:  Allergies  Allergen Reactions  . Rocephin [Ceftriaxone] Other (See Comments)    Light headed, and nearly passed out    Metabolic Disorder Labs: No results found for: HGBA1C, MPG No results found for: PROLACTIN No results found for: CHOL, TRIG, HDL, CHOLHDL, VLDL, LDLCALC  No results found for: TSH  Therapeutic Level Labs: No results found for: LITHIUM No results found for: VALPROATE No components found for:  CBMZ  Current Medications: Current Outpatient Medications  Medication Sig Dispense Refill  . ARIPiprazole (ABILIFY) 5 MG tablet Take 1 tablet (5 mg total) by mouth daily. 90 tablet 0  . ATORVASTATIN CALCIUM PO Take 40 mg by mouth.    Marland Kitchen. LORazepam (ATIVAN) 1 MG tablet Take 1 mg by mouth every 6 (six) hours as needed for anxiety.    . simvastatin (ZOCOR) 40 MG tablet Take 40 mg by mouth daily at 6 PM.     . tamsulosin (FLOMAX) 0.4 MG CAPS capsule Take 0.4 mg by mouth at bedtime.    Marland Kitchen. venlafaxine XR (EFFEXOR-XR) 150 MG 24 hr capsule Take 300 mg by mouth daily with breakfast.      No current facility-administered medications for this visit.      Musculoskeletal: Strength & Muscle Tone: within normal limits Gait & Station: normal Patient leans: N/A  Psychiatric Specialty Exam: Review of Systems  Constitutional: Negative.   HENT: Negative.   Eyes: Negative.   Respiratory: Negative.   Cardiovascular: Negative.   Gastrointestinal: Negative.   Genitourinary: Negative.   Musculoskeletal: Negative.   Skin: Negative.   Neurological: Negative.   Endo/Heme/Allergies: Negative.   Psychiatric/Behavioral: Positive for depression.    Blood pressure (!) 169/96, height 6\' 1"  (1.854 m), weight 224 lb (101.6 kg).Body mass index is 29.55 kg/m.  General Appearance: Casual  Eye Contact:  Good  Speech:  Clear and Coherent  Volume:  Normal  Mood:  mildly depressed  Affect:  Full Range  Thought Process:  Goal Directed  Orientation:  Full (Time, Place, and Person)  Thought Content: Logical   Suicidal Thoughts:  No  Homicidal Thoughts:  No  Memory:  Immediate;   Good Recent;   Good Remote;   Good  Judgement:  Intact  Insight:  Good  Psychomotor Activity:  Normal  Concentration:  Concentration: Good  Recall:  Good  Fund of Knowledge: Good  Language: Good  Akathisia:  Negative  Handed:  Right  AIMS (if indicated): not done  Assets:  Communication Skills Desire for Improvement Financial Resources/Insurance Housing Physical Health Resilience  ADL's:  Intact  Cognition: WNL  Sleep:  Good   Screenings: ECT-MADRS     Office Visit from 12/04/2018 in [facility]  MADRS Total Score  12       Assessment and Plan: 73 yo married male, [place] war veteran, who presents with chronic major depression which severity waxes and weans but as far as he can tel it has never remitted. He has been on 300 mg dose of xr venlafaxine for many years. He has a hx of SI but not recent. He does not wish to change antidepressants (has not  tried Trintellix, Viibryd, [place]) but was open to a trial of Abilify to augment his Effexor We have added 2 mg of aripiprazole to venlafaxine and Sharlet SalinaBenjamin noticed almost immediate improvement in mood. He tolerates it well and is interested in possibly increasing the dose more so depression may fully resolve. He has lorazepam prn anxiety but seldom uses it. His sleep and appetite are good. He has been expressing interest in trying ECT for some time but at this point he would rather wait seeing improvement with Abilify. We will increase the dose of it to 5 mg in hope that full remission of depressive sx can be achieved.  Shyron will return for a follow up visit in 3 months or sooner if needed.    Magdalene Patricia, MD 12/25/2018, 3:37 PM

## 2019-01-01 ENCOUNTER — Other Ambulatory Visit: Payer: Self-pay

## 2019-01-01 ENCOUNTER — Encounter (HOSPITAL_COMMUNITY): Payer: Self-pay | Admitting: Emergency Medicine

## 2019-01-01 ENCOUNTER — Inpatient Hospital Stay (HOSPITAL_COMMUNITY)
Admission: EM | Admit: 2019-01-01 | Discharge: 2019-01-04 | DRG: 065 | Disposition: A | Discharge status: Rehabilitation Facility | Payer: No Typology Code available for payment source | Attending: Neurology | Admitting: Neurology

## 2019-01-01 ENCOUNTER — Emergency Department (HOSPITAL_COMMUNITY): Payer: No Typology Code available for payment source

## 2019-01-01 DIAGNOSIS — Z8719 Personal history of other diseases of the digestive system: Secondary | ICD-10-CM | POA: Diagnosis not present

## 2019-01-01 DIAGNOSIS — R0989 Other specified symptoms and signs involving the circulatory and respiratory systems: Secondary | ICD-10-CM | POA: Diagnosis not present

## 2019-01-01 DIAGNOSIS — I69398 Other sequelae of cerebral infarction: Secondary | ICD-10-CM | POA: Diagnosis not present

## 2019-01-01 DIAGNOSIS — I10 Essential (primary) hypertension: Secondary | ICD-10-CM | POA: Diagnosis present

## 2019-01-01 DIAGNOSIS — F4312 Post-traumatic stress disorder, chronic: Secondary | ICD-10-CM | POA: Diagnosis present

## 2019-01-01 DIAGNOSIS — I161 Hypertensive emergency: Secondary | ICD-10-CM | POA: Diagnosis present

## 2019-01-01 DIAGNOSIS — F331 Major depressive disorder, recurrent, moderate: Secondary | ICD-10-CM | POA: Diagnosis not present

## 2019-01-01 DIAGNOSIS — I77810 Thoracic aortic ectasia: Secondary | ICD-10-CM | POA: Diagnosis present

## 2019-01-01 DIAGNOSIS — I610 Nontraumatic intracerebral hemorrhage in hemisphere, subcortical: Secondary | ICD-10-CM

## 2019-01-01 DIAGNOSIS — Z79899 Other long term (current) drug therapy: Secondary | ICD-10-CM | POA: Diagnosis not present

## 2019-01-01 DIAGNOSIS — I618 Other nontraumatic intracerebral hemorrhage: Secondary | ICD-10-CM | POA: Diagnosis present

## 2019-01-01 DIAGNOSIS — I615 Nontraumatic intracerebral hemorrhage, intraventricular: Secondary | ICD-10-CM | POA: Diagnosis present

## 2019-01-01 DIAGNOSIS — K5901 Slow transit constipation: Secondary | ICD-10-CM | POA: Diagnosis not present

## 2019-01-01 DIAGNOSIS — I619 Nontraumatic intracerebral hemorrhage, unspecified: Secondary | ICD-10-CM

## 2019-01-01 DIAGNOSIS — E785 Hyperlipidemia, unspecified: Secondary | ICD-10-CM | POA: Diagnosis present

## 2019-01-01 DIAGNOSIS — I639 Cerebral infarction, unspecified: Secondary | ICD-10-CM

## 2019-01-01 DIAGNOSIS — R7303 Prediabetes: Secondary | ICD-10-CM | POA: Diagnosis not present

## 2019-01-01 DIAGNOSIS — E079 Disorder of thyroid, unspecified: Secondary | ICD-10-CM | POA: Diagnosis present

## 2019-01-01 DIAGNOSIS — F419 Anxiety disorder, unspecified: Secondary | ICD-10-CM | POA: Diagnosis present

## 2019-01-01 DIAGNOSIS — N400 Enlarged prostate without lower urinary tract symptoms: Secondary | ICD-10-CM | POA: Diagnosis present

## 2019-01-01 DIAGNOSIS — D72829 Elevated white blood cell count, unspecified: Secondary | ICD-10-CM | POA: Diagnosis not present

## 2019-01-01 DIAGNOSIS — Z87891 Personal history of nicotine dependence: Secondary | ICD-10-CM | POA: Diagnosis not present

## 2019-01-01 DIAGNOSIS — E871 Hypo-osmolality and hyponatremia: Secondary | ICD-10-CM | POA: Diagnosis not present

## 2019-01-01 DIAGNOSIS — F329 Major depressive disorder, single episode, unspecified: Secondary | ICD-10-CM | POA: Diagnosis present

## 2019-01-01 DIAGNOSIS — Z881 Allergy status to other antibiotic agents status: Secondary | ICD-10-CM | POA: Diagnosis not present

## 2019-01-01 DIAGNOSIS — G8104 Flaccid hemiplegia affecting left nondominant side: Secondary | ICD-10-CM | POA: Diagnosis not present

## 2019-01-01 DIAGNOSIS — K6389 Other specified diseases of intestine: Secondary | ICD-10-CM | POA: Diagnosis not present

## 2019-01-01 DIAGNOSIS — N401 Enlarged prostate with lower urinary tract symptoms: Secondary | ICD-10-CM

## 2019-01-01 DIAGNOSIS — I69054 Hemiplegia and hemiparesis following nontraumatic subarachnoid hemorrhage affecting left non-dominant side: Secondary | ICD-10-CM | POA: Diagnosis not present

## 2019-01-01 DIAGNOSIS — F332 Major depressive disorder, recurrent severe without psychotic features: Secondary | ICD-10-CM | POA: Diagnosis not present

## 2019-01-01 DIAGNOSIS — I612 Nontraumatic intracerebral hemorrhage in hemisphere, unspecified: Secondary | ICD-10-CM | POA: Diagnosis not present

## 2019-01-01 DIAGNOSIS — R29714 NIHSS score 14: Secondary | ICD-10-CM | POA: Diagnosis present

## 2019-01-01 DIAGNOSIS — N179 Acute kidney failure, unspecified: Secondary | ICD-10-CM | POA: Diagnosis not present

## 2019-01-01 DIAGNOSIS — R471 Dysarthria and anarthria: Secondary | ICD-10-CM | POA: Diagnosis present

## 2019-01-01 DIAGNOSIS — G8194 Hemiplegia, unspecified affecting left nondominant side: Secondary | ICD-10-CM | POA: Diagnosis present

## 2019-01-01 DIAGNOSIS — E782 Mixed hyperlipidemia: Secondary | ICD-10-CM | POA: Diagnosis present

## 2019-01-01 DIAGNOSIS — R110 Nausea: Secondary | ICD-10-CM | POA: Diagnosis not present

## 2019-01-01 DIAGNOSIS — R40241 Glasgow coma scale score 13-15, unspecified time: Secondary | ICD-10-CM | POA: Diagnosis present

## 2019-01-01 DIAGNOSIS — R209 Unspecified disturbances of skin sensation: Secondary | ICD-10-CM | POA: Diagnosis not present

## 2019-01-01 DIAGNOSIS — G479 Sleep disorder, unspecified: Secondary | ICD-10-CM | POA: Diagnosis not present

## 2019-01-01 DIAGNOSIS — R29810 Facial weakness: Secondary | ICD-10-CM | POA: Diagnosis present

## 2019-01-01 DIAGNOSIS — I69322 Dysarthria following cerebral infarction: Secondary | ICD-10-CM | POA: Diagnosis not present

## 2019-01-01 DIAGNOSIS — F4310 Post-traumatic stress disorder, unspecified: Secondary | ICD-10-CM | POA: Diagnosis present

## 2019-01-01 DIAGNOSIS — I69154 Hemiplegia and hemiparesis following nontraumatic intracerebral hemorrhage affecting left non-dominant side: Secondary | ICD-10-CM | POA: Diagnosis not present

## 2019-01-01 HISTORY — DX: Nontraumatic intracerebral hemorrhage, unspecified: I61.9

## 2019-01-01 LAB — MRSA PCR SCREENING: MRSA by PCR: NEGATIVE

## 2019-01-01 LAB — CBC
HCT: 45.5 % (ref 39.0–52.0)
Hemoglobin: 14.5 g/dL (ref 13.0–17.0)
MCH: 28.7 pg (ref 26.0–34.0)
MCHC: 31.9 g/dL (ref 30.0–36.0)
MCV: 89.9 fL (ref 80.0–100.0)
PLATELETS: 269 10*3/uL (ref 150–400)
RBC: 5.06 MIL/uL (ref 4.22–5.81)
RDW: 13 % (ref 11.5–15.5)
WBC: 7.8 10*3/uL (ref 4.0–10.5)
nRBC: 0 % (ref 0.0–0.2)

## 2019-01-01 LAB — DIFFERENTIAL
Abs Immature Granulocytes: 0.02 10*3/uL (ref 0.00–0.07)
BASOS PCT: 1 %
Basophils Absolute: 0.1 10*3/uL (ref 0.0–0.1)
EOS PCT: 2 %
Eosinophils Absolute: 0.2 10*3/uL (ref 0.0–0.5)
Immature Granulocytes: 0 %
Lymphocytes Relative: 34 %
Lymphs Abs: 2.6 10*3/uL (ref 0.7–4.0)
MONOS PCT: 8 %
Monocytes Absolute: 0.7 10*3/uL (ref 0.1–1.0)
NEUTROS PCT: 55 %
Neutro Abs: 4.3 10*3/uL (ref 1.7–7.7)

## 2019-01-01 LAB — COMPREHENSIVE METABOLIC PANEL
ALT: 16 U/L (ref 0–44)
ANION GAP: 8 (ref 5–15)
AST: 20 U/L (ref 15–41)
Albumin: 4 g/dL (ref 3.5–5.0)
Alkaline Phosphatase: 45 U/L (ref 38–126)
BILIRUBIN TOTAL: 0.6 mg/dL (ref 0.3–1.2)
BUN: 11 mg/dL (ref 8–23)
CHLORIDE: 103 mmol/L (ref 98–111)
CO2: 24 mmol/L (ref 22–32)
Calcium: 9.1 mg/dL (ref 8.9–10.3)
Creatinine, Ser: 0.76 mg/dL (ref 0.61–1.24)
GFR calc Af Amer: >60 mL/min (ref >60)
Glucose, Bld: 107 mg/dL — ABNORMAL HIGH (ref 70–99)
POTASSIUM: 3.7 mmol/L (ref 3.5–5.1)
Sodium: 135 mmol/L (ref 135–145)
TOTAL PROTEIN: 7 g/dL (ref 6.5–8.1)

## 2019-01-01 LAB — PROTIME-INR
INR: 1.1 (ref 0.8–1.2)
PROTHROMBIN TIME: 14.1 s (ref 11.4–15.2)

## 2019-01-01 LAB — I-STAT CREATININE, ED: CREATININE: 0.7 mg/dL (ref 0.61–1.24)

## 2019-01-01 LAB — APTT: APTT: 30 s (ref 24–36)

## 2019-01-01 MED ORDER — SENNOSIDES-DOCUSATE SODIUM 8.6-50 MG PO TABS
1.0000 | ORAL_TABLET | Freq: Two times a day (BID) | ORAL | Status: DC
  Administered 2019-01-02 – 2019-01-04 (×4): 1 via ORAL
  Filled 2019-01-01 (×4): qty 1

## 2019-01-01 MED ORDER — STROKE: EARLY STAGES OF RECOVERY BOOK
Freq: Once | Status: AC
  Administered 2019-01-01: 23:00:00

## 2019-01-01 MED ORDER — PANTOPRAZOLE SODIUM 40 MG IV SOLR
40.0000 mg | Freq: Every day | INTRAVENOUS | Status: DC
  Administered 2019-01-01: 40 mg via INTRAVENOUS
  Filled 2019-01-01: qty 40

## 2019-01-01 MED ORDER — ACETAMINOPHEN 650 MG RE SUPP: 650.0000 mg | RECTAL | Status: DC | PRN

## 2019-01-01 MED ORDER — ACETAMINOPHEN 325 MG PO TABS
650.0000 mg | ORAL_TABLET | ORAL | Status: DC | PRN
  Administered 2019-01-02 – 2019-01-04 (×5): 650 mg via ORAL
  Filled 2019-01-01 (×5): qty 2

## 2019-01-01 MED ORDER — NICARDIPINE HCL IN NACL 20-0.86 MG/200ML-% IV SOLN
INTRAVENOUS | Status: AC
  Filled 2019-01-01: qty 200

## 2019-01-01 MED ORDER — SODIUM CHLORIDE 0.9% FLUSH
3.0000 mL | Freq: Once | INTRAVENOUS | Status: AC
  Administered 2019-01-02: 3 mL via INTRAVENOUS

## 2019-01-01 MED ORDER — ACETAMINOPHEN 160 MG/5ML PO SOLN
650.0000 mg | ORAL | Status: DC | PRN
  Filled 2019-01-01: qty 20.3

## 2019-01-01 MED ORDER — NICARDIPINE HCL IN NACL 20-0.86 MG/200ML-% IV SOLN
3.0000 mg/h | INTRAVENOUS | Status: DC
  Administered 2019-01-01 – 2019-01-02 (×3): 5 mg/h via INTRAVENOUS
  Administered 2019-01-02: 7 mg/h via INTRAVENOUS
  Administered 2019-01-02: 6 mg/h via INTRAVENOUS
  Administered 2019-01-03 (×3): 7 mg/h via INTRAVENOUS
  Filled 2019-01-01 (×3): qty 200
  Filled 2019-01-01: qty 400
  Filled 2019-01-01 (×4): qty 200

## 2019-01-01 MED ORDER — LABETALOL HCL 5 MG/ML IV SOLN
10.0000 mg | Freq: Once | INTRAVENOUS | Status: AC
  Administered 2019-01-01: 10 mg via INTRAVENOUS
  Filled 2019-01-01: qty 4

## 2019-01-01 NOTE — ED Notes (Signed)
Jearold Borza daughter [phone] -please call with a pt update due to the no visitor restriction.

## 2019-01-01 NOTE — ED Notes (Signed)
Pt Family Home Number: [phone] Please have pt use this number to contact family.

## 2019-01-01 NOTE — ED Notes (Signed)
ED TO INPATIENT HANDOFF REPORT  ED Nurse Name and Phone #: [name] [phone]  S Name/Age/Gender Patrick Pollard 73 y.o. male Room/Bed: 028C/028C  Code Status   Code Status: Full Code  Home/SNF/Other Home Patient oriented to: self, place, time and situation Is this baseline? Yes   Triage Complete: Triage complete  Chief Complaint CODE STROKE    Triage Note No notes on file   Allergies Allergies  Allergen Reactions  . Rocephin [Ceftriaxone] Other (See Comments)    Light headed, and nearly passed out    Level of Care/Admitting Diagnosis ED Disposition    ED Disposition Condition Comment   Admit  Hospital [facility]: [facility] [[id number]]  Level of Care: ICU [6]  Diagnosis: ICH (intracerebral hemorrhage) [facility]) [[id number]]  Admitting Physician: Elba Barman [[id number]]  Attending Physician: Elba Barman [[id number]]  Estimated length of stay: 3 - 4 days  Certification:: I certify this patient will need inpatient services for at least 2 midnights  PT Class (Do Not Modify): Inpatient [101]  PT Acc Code (Do Not Modify): Private [1]       B Medical/Surgery History Past Medical History:  Diagnosis Date  . Anxiety   . Colon polyps    s/p colonoscopy 12/2016 wtih polyp removal   . Depression    History reviewed. No pertinent surgical history.   A IV Location/Drains/Wounds Patient Lines/Drains/Airways Status   Active Line/Drains/Airways    Name:   Placement date:   Placement time:   Site:   Days:   Peripheral IV 01/01/19 Right Hand   01/01/19    1957    Hand   less than 1   Peripheral IV 01/01/19 Left Antecubital   01/01/19    2004    Antecubital   less than 1   Wound / Incision (Open or Dehisced) 12/31/17 Other (Comment);Laceration Eye Right from fall prior to admission   12/31/17    0900    Eye   366   Wound / Incision (Open or Dehisced) 12/31/17 Laceration Nose Mid small round abrasion on mid nose   12/31/17    1058    Nose   366           Intake/Output Last 24 hours No intake or output data in the 24 hours ending 01/01/19 2121  Labs/Imaging Results for orders placed or performed during the hospital encounter of 01/01/19 (from the past 48 hour(s))  Protime-INR     Status: None   Collection Time: 01/01/19  7:50 PM  Result Value Ref Range   Prothrombin Time 14.1 11.4 - 15.2 seconds   INR 1.1 0.8 - 1.2    Comment: (NOTE) INR goal varies based on device and disease states. Performed at [facility], [address] [place], [place] [address]   APTT     Status: None   Collection Time: 01/01/19  7:50 PM  Result Value Ref Range   aPTT 30 24 - 36 seconds    Comment: Performed at [facility], [address], [place], [place] [address]  CBC     Status: None   Collection Time: 01/01/19  7:50 PM  Result Value Ref Range   WBC 7.8 4.0 - 10.5 K/uL   RBC 5.06 4.22 - 5.81 MIL/uL   Hemoglobin 14.5 13.0 - 17.0 g/dL   HCT 24.4 97.5 - 30.0 %   MCV 89.9 80.0 - 100.0 fL   MCH 28.7 26.0 - 34.0 pg   MCHC 31.9  30.0 - 36.0 g/dL   RDW 25.6 72.0 - 91.9 %   Platelets 269 150 - 400 K/uL   nRBC 0.0 0.0 - 0.2 %    Comment: Performed at [facility], [address], [place], [place] [address]  Differential     Status: None   Collection Time: 01/01/19  7:50 PM  Result Value Ref Range   Neutrophils Relative % 55 %   Neutro Abs 4.3 1.7 - 7.7 K/uL   Lymphocytes Relative 34 %   Lymphs Abs 2.6 0.7 - 4.0 K/uL   Monocytes Relative 8 %   Monocytes Absolute 0.7 0.1 - 1.0 K/uL   Eosinophils Relative 2 %   Eosinophils Absolute 0.2 0.0 - 0.5 K/uL   Basophils Relative 1 %   Basophils Absolute 0.1 0.0 - 0.1 K/uL   Immature Granulocytes 0 %   Abs Immature Granulocytes 0.02 0.00 - 0.07 K/uL    Comment: Performed at [facility], [address], [place], [place] [address]  Comprehensive metabolic panel     Status: Abnormal   Collection Time: 01/01/19  7:50 PM  Result Value Ref Range   Sodium 135 135 - 145 mmol/L    Potassium 3.7 3.5 - 5.1 mmol/L   Chloride 103 98 - 111 mmol/L   CO2 24 22 - 32 mmol/L   Glucose, Bld 107 (H) 70 - 99 mg/dL   BUN 11 8 - 23 mg/dL   Creatinine, Ser 2.54 0.61 - 1.24 mg/dL   Calcium 9.1 8.9 - 86.2 mg/dL   Total Protein 7.0 6.5 - 8.1 g/dL   Albumin 4.0 3.5 - 5.0 g/dL   AST 20 15 - 41 U/L   ALT 16 0 - 44 U/L   Alkaline Phosphatase 45 38 - 126 U/L   Total Bilirubin 0.6 0.3 - 1.2 mg/dL   GFR calc non Af Amer >60 >60 mL/min   GFR calc Af Amer >60 >60 mL/min   Anion gap 8 5 - 15    Comment: Performed at [facility], [address] [place], [place] [address]  I-stat Creatinine, ED     Status: None   Collection Time: 01/01/19  7:57 PM  Result Value Ref Range   Creatinine, Ser 0.70 0.61 - 1.24 mg/dL   Ct Head Code Stroke Wo Contrast  Result Date: 01/01/2019 CLINICAL DATA:  Code stroke. Last seen normal 1850 hours. Left-sided weakness and slurred speech. EXAM: CT HEAD WITHOUT CONTRAST TECHNIQUE: Contiguous axial images were obtained from the base of the skull through the vertex without intravenous contrast. COMPARISON:  12/31/2017 FINDINGS: Brain: 3.1 x 2.6 x 2.3 cm (volume = 9.7 cm^3) acute hemorrhage within the right lateral thalamus/posterior limb internal capsule region. Mild surrounding edema. No intraventricular penetration at this time. Atrophy and chronic small-vessel ischemic changes elsewhere. No sign of acute cortical stroke. No mass lesion, hydrocephalus or extra-axial collection. Vascular: No abnormal vascular finding. Skull: Negative Sinuses/Orbits: Clear/normal Other: None IMPRESSION: 1. 3.1 x 2.6 x 2.3 cm acute hemorrhage in the right lateral thalamus/posterior limb internal capsule with mild surrounding edema. This could be a primary hemorrhage or hemorrhagic infarction. 2. Atrophy and chronic small-vessel ischemic changes elsewhere. 3. ASPECTS is not applicable in this setting. 4. These results were communicated to Dr. Laurence Slate at 8:02 pmon 3/24/2020by text page via  the [facility] messaging system. Electronically Signed   By: Paulina Fusi M.D.   On: 01/01/2019 20:03    Pending Labs Unresulted Labs (From admission, onward)  None      Vitals/Pain Today's Vitals   01/01/19 2045 01/01/19 2100 01/01/19 2105 01/01/19 2115  BP: 133/84 136/86 135/85 129/83  Pulse: 81 92 84 85  Resp: (!) 24 (!) 24 (!) 25 (!) 28  Temp:      TempSrc:      SpO2: 92% 91% 92% 95%  Weight:      Height:      PainSc:        Isolation Precautions No active isolations  Medications Medications  sodium chloride flush (NS) 0.9 % injection 3 mL (has no administration in time range)  nicardipine (CARDENE)  in 0.86% saline IV infusion (0.1 mg/ml) (12.5 mg/hr Intravenous Rate/Dose Change 01/01/19 2045)   stroke: mapping our early stages of recovery book (has no administration in time range)  acetaminophen (TYLENOL) tablet 650 mg (has no administration in time range)    Or  acetaminophen (TYLENOL) solution 650 mg (has no administration in time range)    Or  acetaminophen (TYLENOL) suppository 650 mg (has no administration in time range)  senna-docusate (Senokot-S) tablet 1 tablet (has no administration in time range)  pantoprazole (PROTONIX) injection 40 mg (has no administration in time range)  labetalol (NORMODYNE,TRANDATE) injection 10 mg (10 mg Intravenous Given 01/01/19 2024)    Mobility non-ambulatory Moderate fall risk   Focused Assessments Neuro Assessment Handoff:  Swallow screen pass? Not Able to Complete    NIH Stroke Scale ( + Modified Stroke Scale Criteria)  Level of Consciousness (1a.)   : Alert, keenly responsive LOC Questions (1b. )   +: Answers both questions correctly LOC Commands (1c. )   + : Performs both tasks correctly Best Gaze (2. )  +: Normal Visual (3. )  +: No visual loss Facial Palsy (4. )    : Normal symmetrical movements Motor Arm, Left (5a. )   +: No movement Motor Arm, Right (5b. )   +: No drift Motor Leg, Left (6a. )   +: No  movement Motor Leg, Right (6b. )   +: No drift Limb Ataxia (7. ): Present in one limb Sensory (8. )   +: Mild-to-moderate sensory loss, patient feels pinprick is less sharp or is dull on the affected side, or there is a loss of superficial pain with pinprick, but patient is aware of being touched Best Language (9. )   +: No aphasia Dysarthria (10. ): Mild-to-moderate dysarthria, patient slurs at least some words and, at worst, can be understood with some difficulty Extinction/Inattention (11.)   +: Visual/tactile/auditory/spatial/personal inattention Modified SS Total  +: 10 Complete NIHSS TOTAL: 12 Last date known well: 01/01/19 Last time known well: 1850 Neuro Assessment: Exceptions to WDL Neuro Checks:      Last Documented NIHSS Modified Score: 10 (01/01/19 2100) Has TPA been given? No If patient is a Neuro Trauma and patient is going to OR before floor call report to 4N Charge nurse: [phone] or [phone]     R Recommendations: See Admitting Provider Note  Report given to:   Additional Notes: N/A

## 2019-01-01 NOTE — Code Documentation (Signed)
Patient arrived with [facility] - LT sided weakness and slurred speech. NIH 12. CT HEAD + acute HEMORRHAGE RT LATERAL THALAMUS/POSTERIOR LIMB CAPSULE region. CARDENE started. Plan: admit NTICU.  Start Time 1932 End Time 2015

## 2019-01-01 NOTE — H&P (Signed)
Chief Complaint: Left side weakness  History obtained from: Patient and Chart   HPI:                                                                                                                                       Patrick Pollard is a [name] with PMH of Depression, remote GI bleeding due to polyp s/p resection presents to ED as a stroke alert.  Last known normal 6:50 PM and patient was at his friend's house and while at the bathroom suddenly became weak on the left side.  Patient gradually went down on the floor and EMS was called.  Patient's blood pressure was 155 systolic on arrival.  CT head was obtained which showed right thalamic hemorrhage without intraventricular extension.  On assessment patient is awake and alert.  Neglected the left side to sensory examination.  Denies taking any antiplatelets.  Date last known well: 3.24.20 Time last known well: 6.50pm tPA Given: no, hemorrhage NIHSS: 14 Baseline MRS 0  Intracerebral Hemorrhage (ICH) Score  Glascow Coma Score  13-15 0  Age >/= 80 yes no 0  ICH volume >/= 2ml  no 0  IVH yesno 0  Infratentorial originno 0 Total:  0   Past Medical History:  Diagnosis Date  . Anxiety   . Colon polyps    s/p colonoscopy 12/2016 wtih polyp removal   . Depression     History reviewed. No pertinent surgical history.  History reviewed. No pertinent family history. Social History:  reports that he quit smoking about 22 years ago. His smoking use included cigarettes. He quit after 37.00 years of use. He has never used smokeless tobacco. He reports previous alcohol use. He reports that he does not use drugs.  Allergies:  Allergies  Allergen Reactions  . Rocephin [Ceftriaxone] Other (See Comments)    Light headed, and nearly passed out    Medications:                                                                                                                        I reviewed home  medications   ROS:  14 systems reviewed and negative except above   Examination:                                                                                                      General: Appears well-developed Psych: Affect appropriate to situation Eyes: No scleral injection HENT: No OP obstrucion Head: Normocephalic.  Cardiovascular: Normal rate and regular rhythm.  Respiratory: Effort normal and breath sounds normal to anterior ascultation GI: Soft.  No distension. There is no tenderness.  Skin: WDI    Neurological Examination Mental Status: Alert, oriented, thought content appropriate.  Speech fluent without evidence of aphasia.Mild dysarthria.Able to follow 3 step commands without difficulty. Cranial Nerves: II: Visual fields grossly normal,  III,IV, VI: ptosis not present, extra-ocular motions intact bilaterally, pupils equal, round, reactive to light and accommodation V,VII:left facial droop, facial light touch sensation normal bilaterally VIII: hearing normal bilaterally IX,X: uvula rises symmetrically XI: bilateral shoulder shrug XII: midline tongue extension Motor: Right : Upper extremity   5/5    Left:     Upper extremity   0/5  Lower extremity   5/5     Lower extremity   0/5 Tone and bulk:normal tone throughout; no atrophy noted Sensory: unable to feel light  Touch, temperature, position sense on left arm and leg, neglects Deep Tendon Reflexes: 2+ and symmetric throughout Plantars: Right: downgoing   Left: downgoing Cerebellar: normal finger-to-nose on right arm Gait: unable to assess    Lab Results: Basic Metabolic Panel: Recent Labs  Lab 01/01/19 1950 01/01/19 1957  NA 135  --   K 3.7  --   CL 103  --   CO2 24  --   GLUCOSE 107*  --   BUN 11  --   CREATININE 0.76 0.70  CALCIUM 9.1  --     CBC: Recent Labs  Lab  01/01/19 1950  WBC 7.8  NEUTROABS 4.3  HGB 14.5  HCT 45.5  MCV 89.9  PLT 269    Coagulation Studies: Recent Labs    01/01/19 1950  LABPROT 14.1  INR 1.1    Imaging: Ct Head Code Stroke Wo Contrast  Result Date: 01/01/2019 CLINICAL DATA:  Code stroke. Last seen normal 1850 hours. Left-sided weakness and slurred speech. EXAM: CT HEAD WITHOUT CONTRAST TECHNIQUE: Contiguous axial images were obtained from the base of the skull through the vertex without intravenous contrast. COMPARISON:  12/31/2017 FINDINGS: Brain: 3.1 x 2.6 x 2.3 cm (volume = 9.7 cm^3) acute hemorrhage within the right lateral thalamus/posterior limb internal capsule region. Mild surrounding edema. No intraventricular penetration at this time. Atrophy and chronic small-vessel ischemic changes elsewhere. No sign of acute cortical stroke. No mass lesion, hydrocephalus or extra-axial collection. Vascular: No abnormal vascular finding. Skull: Negative Sinuses/Orbits: Clear/normal Other: None IMPRESSION: 1. 3.1 x 2.6 x 2.3 cm acute hemorrhage in the right lateral thalamus/posterior limb internal capsule with mild surrounding edema. This could be a primary hemorrhage or hemorrhagic infarction. 2. Atrophy and chronic small-vessel ischemic changes elsewhere. 3. ASPECTS is not applicable in this setting. 4. These results were communicated to  Dr. Laurence Slate at 8:02 pmon 3/24/2020by text page via the [facility] messaging system. Electronically Signed   By: Paulina Fusi M.D.   On: 01/01/2019 20:03     ASSESSMENT AND PLAN  [name] with PMH of Depression, remote GI bleeding due to polyp s/p resection presents to ED as a stroke alert.  CT head showed right thalamic hemorrhage.  Was started on Cardene drip for blood pressure control.  He is remained stable since admission.  No evidence of coagulopathy on lab work patient denies taking any platelets, anticoagulants.    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  He is at risk for significant risk of neurological worsening from cerebral edema,  death from brain herniation, heart failure,infection, respiratory failure and seizure. This patient's care requires constant monitoring of vital signs, hemodynamics, respiratory and cardiac monitoring, review of multiple databases, neurological assessment, discussion with family, other specialists and medical decision making of high complexity.  I spent 50  minutes of neurocritical time in the care of this patient.     Nadie Fiumara [facility] Pager Number [phone]

## 2019-01-01 NOTE — ED Provider Notes (Signed)
[facility] EMERGENCY DEPARTMENT Provider Note   CSN: [id number] Arrival date & time: 01/01/19  1954  An emergency department physician performed an initial assessment on this suspected stroke patient at 29.  History   Chief Complaint Chief Complaint  Patient presents with  . Code Stroke    HPI Jamas Seigle is a 73 y.o. male.     The history is provided by the patient and the EMS personnel.  Cerebrovascular Accident  This is a new problem. The current episode started 1 to 2 hours ago. The problem occurs constantly. The problem has not changed since onset.Pertinent negatives include no chest pain, no abdominal pain, no headaches and no shortness of breath. Associated symptoms comments: Left upper and lower extremity weakness and numbness, speech difficulties . Nothing aggravates the symptoms. Nothing relieves the symptoms. He has tried nothing for the symptoms. The treatment provided no relief.    Past Medical History:  Diagnosis Date  . Anxiety   . Colon polyps    s/p colonoscopy 12/2016 wtih polyp removal   . Depression     Patient Active Problem List   Diagnosis Date Noted  . Chronic post-traumatic stress disorder (PTSD) 12/25/2018  . Major depressive disorder, recurrent episode, moderate (HCC) 12/04/2018  . GIB (gastrointestinal bleeding) 12/31/2017    History reviewed. No pertinent surgical history.      Home Medications    Prior to Admission medications   Medication Sig Start Date End Date Taking? Authorizing Provider  ARIPiprazole (ABILIFY) 5 MG tablet Take 1 tablet (5 mg total) by mouth daily. 12/25/18 03/25/19  Pucilowski, Olgierd A, MD  ATORVASTATIN CALCIUM PO Take 40 mg by mouth.    [provider]  LORazepam (ATIVAN) 1 MG tablet Take 1 mg by mouth every 6 (six) hours as needed for anxiety.    [provider]  simvastatin (ZOCOR) 40 MG tablet Take 40 mg by mouth daily at 6 PM.     [provider]   tamsulosin (FLOMAX) 0.4 MG CAPS capsule Take 0.4 mg by mouth at bedtime.    [provider]  venlafaxine XR (EFFEXOR-XR) 150 MG 24 hr capsule Take 300 mg by mouth daily with breakfast.    [provider]    Family History History reviewed. No pertinent family history.  Social History Social History   Tobacco Use  . Smoking status: Former Smoker    Years: 37.00    Types: Cigarettes    Last attempt to quit: 12/31/1996    Years since quitting: 22.0  . Smokeless tobacco: Never Used  Substance Use Topics  . Alcohol use: Not Currently  . Drug use: Never     Allergies   Rocephin [ceftriaxone]   Review of Systems Review of Systems  Constitutional: Negative for chills and fever.  HENT: Negative for ear pain and sore throat.   Eyes: Negative for pain and visual disturbance.  Respiratory: Negative for cough and shortness of breath.   Cardiovascular: Negative for chest pain and palpitations.  Gastrointestinal: Negative for abdominal pain and vomiting.  Genitourinary: Negative for dysuria and hematuria.  Musculoskeletal: Negative for arthralgias and back pain.  Skin: Negative for color change and rash.  Neurological: Positive for speech difficulty, weakness and numbness. Negative for seizures, syncope, facial asymmetry and headaches.  All other systems reviewed and are negative.    Physical Exam Updated Vital Signs  ED Triage Vitals  Enc Vitals Group     BP 01/01/19 2008 (!) 158/106  Pulse Rate 01/01/19 2004 80     Resp 01/01/19 2004 18     Temp 01/01/19 2008 98.1 F (36.7 C)     Temp Source 01/01/19 2008 Oral     SpO2 01/01/19 2004 96 %     Weight 01/01/19 1900 227 lb 1.2 oz (103 kg)     Height 01/01/19 2005 6\' 1"  (1.854 m)     Head Circumference --      Peak Flow --      Pain Score 01/01/19 2005 0     Pain Loc --      Pain Edu? --      Excl. in GC? --     Physical Exam Vitals signs and nursing note reviewed.  Constitutional:      General:  He is not in acute distress.    Appearance: He is well-developed. He is not ill-appearing.  HENT:     Head: Normocephalic and atraumatic.     Nose: Nose normal.     Mouth/Throat:     Mouth: Mucous membranes are moist.  Eyes:     Extraocular Movements: Extraocular movements intact.     Conjunctiva/sclera: Conjunctivae normal.     Pupils: Pupils are equal, round, and reactive to light.  Neck:     Musculoskeletal: Normal range of motion and neck supple.  Cardiovascular:     Rate and Rhythm: Normal rate and regular rhythm.     Pulses: Normal pulses.     Heart sounds: Normal heart sounds. No murmur.  Pulmonary:     Effort: Pulmonary effort is normal. No respiratory distress.     Breath sounds: Normal breath sounds.  Abdominal:     General: Abdomen is flat.     Palpations: Abdomen is soft.     Tenderness: There is no abdominal tenderness.  Musculoskeletal: Normal range of motion.  Skin:    General: Skin is warm and dry.     Capillary Refill: Capillary refill takes less than 2 seconds.  Neurological:     Mental Status: He is alert.     Comments: Cranial nerves appear intact grossly, patient with left upper extremity and lower extremity flaccid paralysis, no sensation in the left side of his body, slurred speech, 5+ out of 5 strength in the right upper and right lower extremity, normal sensation on the right side of his body      ED Treatments / Results  Labs (all labs ordered are listed, but only abnormal results are displayed) Labs Reviewed  PROTIME-INR  APTT  CBC  DIFFERENTIAL  COMPREHENSIVE METABOLIC PANEL  I-STAT CREATININE, ED  CBG MONITORING, ED  I-STAT CREATININE, ED    EKG None  Radiology Ct Head Code Stroke Wo Contrast  Result Date: 01/01/2019 CLINICAL DATA:  Code stroke. Last seen normal 1850 hours. Left-sided weakness and slurred speech. EXAM: CT HEAD WITHOUT CONTRAST TECHNIQUE: Contiguous axial images were obtained from the base of the skull through the  vertex without intravenous contrast. COMPARISON:  12/31/2017 FINDINGS: Brain: 3.1 x 2.6 x 2.3 cm (volume = 9.7 cm^3) acute hemorrhage within the right lateral thalamus/posterior limb internal capsule region. Mild surrounding edema. No intraventricular penetration at this time. Atrophy and chronic small-vessel ischemic changes elsewhere. No sign of acute cortical stroke. No mass lesion, hydrocephalus or extra-axial collection. Vascular: No abnormal vascular finding. Skull: Negative Sinuses/Orbits: Clear/normal Other: None IMPRESSION: 1. 3.1 x 2.6 x 2.3 cm acute hemorrhage in the right lateral thalamus/posterior limb internal capsule with mild surrounding edema. This could  be a primary hemorrhage or hemorrhagic infarction. 2. Atrophy and chronic small-vessel ischemic changes elsewhere. 3. ASPECTS is not applicable in this setting. 4. These results were communicated to Dr. Laurence Slate at 8:02 pmon 3/24/2020by text page via the [facility] messaging system. Electronically Signed   By: Paulina Fusi M.D.   On: 01/01/2019 20:03    Procedures .Critical Care Performed by: Virgina Norfolk, DO Authorized by: Virgina Norfolk, DO   Critical care provider statement:    Critical care time (minutes):  40   Critical care time was exclusive of:  Separately billable procedures and treating other patients and teaching time   Critical care was necessary to treat or prevent imminent or life-threatening deterioration of the following conditions:  CNS failure or compromise   Critical care was time spent personally by me on the following activities:  Development of treatment plan with patient or surrogate, discussions with primary provider, evaluation of patient's response to treatment, blood draw for specimens, examination of patient, obtaining history from patient or surrogate, ordering and performing treatments and interventions, ordering and review of laboratory studies, ordering and review of radiographic studies, pulse oximetry,  re-evaluation of patient's condition and review of old charts   I assumed direction of critical care for this patient from another provider in my specialty: no     (including critical care time)  Medications Ordered in ED Medications  sodium chloride flush (NS) 0.9 % injection 3 mL (has no administration in time range)  nicardipine (CARDENE)  in 0.86% saline IV infusion (0.1 mg/ml) (10 mg/hr Intravenous Rate/Dose Change 01/01/19 2026)  labetalol (NORMODYNE,TRANDATE) injection 10 mg (10 mg Intravenous Given 01/01/19 2024)     Initial Impression / Assessment and Plan / ED Course  I have reviewed the triage vital signs and the nursing notes.  Pertinent labs & imaging results that were available during my care of the patient were reviewed by me and considered in my medical decision making (see chart for details).     Najib Colmenares is a 73 year old male with history of depression, anxiety who presents to the ED with strokelike symptoms.  Patient with mild hypertension but otherwise normal vitals.  Patient about an hour prior to arrival with left-sided upper extremity and left lower extremity weakness and numbness.  Patient went directly to the CT scan as a code stroke with neurology.  Patient found to have thalamic and basal ganglia bleed.  Patient to be started on Cardene to maintain systolic blood pressure around 140.  He was given IV labetalol.  Patient is protecting his airway.  Lab work has been collected.  Neurology will admit the patient to the neurological ICU for further stroke management.  Remained hemodynamically stable on Cardene drip.  Patient did not have any significant anemia, electrolyte abnormality, kidney injury.  No leukocytosis.  Patient remained neurologically stable throughout my care as well and admitted for further acute stroke care.   This chart was dictated using voice recognition software.  Despite best efforts to proofread,  errors can occur which can change  the documentation meaning.    Final Clinical Impressions(s) / ED Diagnoses   Final diagnoses:  Cerebrovascular accident (CVA), unspecified mechanism [facility])    ED Discharge Orders    None       Virgina Norfolk, DO 01/01/19 2029

## 2019-01-02 ENCOUNTER — Inpatient Hospital Stay (HOSPITAL_COMMUNITY): Payer: No Typology Code available for payment source

## 2019-01-02 ENCOUNTER — Encounter (HOSPITAL_COMMUNITY): Payer: Self-pay

## 2019-01-02 DIAGNOSIS — I161 Hypertensive emergency: Secondary | ICD-10-CM

## 2019-01-02 DIAGNOSIS — E785 Hyperlipidemia, unspecified: Secondary | ICD-10-CM

## 2019-01-02 DIAGNOSIS — I639 Cerebral infarction, unspecified: Secondary | ICD-10-CM

## 2019-01-02 DIAGNOSIS — I615 Nontraumatic intracerebral hemorrhage, intraventricular: Secondary | ICD-10-CM

## 2019-01-02 LAB — ECHOCARDIOGRAM COMPLETE
Height: 73 in
WEIGHTICAEL: 3633.18 [oz_av]

## 2019-01-02 LAB — GLUCOSE, CAPILLARY: Glucose-Capillary: 113 mg/dL — ABNORMAL HIGH (ref 70–99)

## 2019-01-02 MED ORDER — ONDANSETRON HCL 4 MG/2ML IJ SOLN
4.0000 mg | Freq: Four times a day (QID) | INTRAMUSCULAR | Status: DC | PRN
  Administered 2019-01-02 – 2019-01-04 (×7): 4 mg via INTRAVENOUS
  Filled 2019-01-02 (×7): qty 2

## 2019-01-02 MED ORDER — TAMSULOSIN HCL 0.4 MG PO CAPS
0.4000 mg | ORAL_CAPSULE | Freq: Every day | ORAL | Status: DC
  Administered 2019-01-02 – 2019-01-03 (×2): 0.4 mg via ORAL
  Filled 2019-01-02 (×2): qty 1

## 2019-01-02 MED ORDER — ARIPIPRAZOLE 10 MG PO TABS
5.0000 mg | ORAL_TABLET | Freq: Every day | ORAL | Status: DC
  Administered 2019-01-02 – 2019-01-04 (×3): 5 mg via ORAL
  Filled 2019-01-02 (×3): qty 1

## 2019-01-02 MED ORDER — LABETALOL HCL 5 MG/ML IV SOLN
10.0000 mg | INTRAVENOUS | Status: DC | PRN
  Administered 2019-01-02 (×3): 20 mg via INTRAVENOUS
  Filled 2019-01-02 (×3): qty 4

## 2019-01-02 MED ORDER — VENLAFAXINE HCL ER 75 MG PO CP24
300.0000 mg | ORAL_CAPSULE | Freq: Every day | ORAL | Status: DC
  Administered 2019-01-03 – 2019-01-04 (×2): 300 mg via ORAL
  Filled 2019-01-02 (×2): qty 2
  Filled 2019-01-02: qty 4

## 2019-01-02 MED ORDER — IOHEXOL 350 MG/ML SOLN
75.0000 mL | Freq: Once | INTRAVENOUS | Status: AC | PRN
  Administered 2019-01-02: 75 mL via INTRAVENOUS

## 2019-01-02 MED ORDER — PANTOPRAZOLE SODIUM 40 MG PO TBEC
40.0000 mg | DELAYED_RELEASE_TABLET | Freq: Every day | ORAL | Status: DC
  Administered 2019-01-02 – 2019-01-03 (×2): 40 mg via ORAL
  Filled 2019-01-02 (×2): qty 1

## 2019-01-02 MED ORDER — TRIAMTERENE-HCTZ 37.5-25 MG PO TABS
1.0000 | ORAL_TABLET | Freq: Every day | ORAL | Status: DC
  Administered 2019-01-02 – 2019-01-04 (×3): 1 via ORAL
  Filled 2019-01-02 (×3): qty 1

## 2019-01-02 MED ORDER — BUPROPION HCL ER (XL) 300 MG PO TB24
300.0000 mg | ORAL_TABLET | Freq: Every day | ORAL | Status: DC
  Administered 2019-01-02: 300 mg via ORAL
  Filled 2019-01-02 (×2): qty 1

## 2019-01-02 NOTE — Progress Notes (Signed)
STROKE TEAM PROGRESS NOTE   INTERVAL HISTORY Pt lying in bed.  Still has left facial droop and left hemiplegia.  Left sided sensation diminished.  CT showed enlargement of right BG hemorrhage with IVH.  Patient denies headache.  BP under control with Cardene.  Vitals:   01/02/19 0900 01/02/19 1000 01/02/19 1015 01/02/19 1030  BP: 130/86 (!) 141/99 (!) 142/97 (!) 146/94  Pulse: 63 68 65 65  Resp: 17 (!) 21 (!) 21 (!) 22  Temp:      TempSrc:      SpO2: 94% 98% 99% 94%  Weight:      Height:        CBC:  Recent Labs  Lab 01/01/19 1950  WBC 7.8  NEUTROABS 4.3  HGB 14.5  HCT 45.5  MCV 89.9  PLT 269    Basic Metabolic Panel:  Recent Labs  Lab 01/01/19 1950 01/01/19 1957  NA 135  --   K 3.7  --   CL 103  --   CO2 24  --   GLUCOSE 107*  --   BUN 11  --   CREATININE 0.76 0.70  CALCIUM 9.1  --    Lipid Panel: No results found for: CHOL, TRIG, HDL, CHOLHDL, VLDL, LDLCALC HgbA1c: No results found for: HGBA1C Urine Drug Screen: No results found for: LABOPIA, COCAINSCRNUR, LABBENZ, AMPHETMU, THCU, LABBARB  Alcohol Level No results found for: ETH  IMAGING Ct Angio Head W Or Wo Contrast  Addendum Date: 01/02/2019   ADDENDUM REPORT: 01/02/2019 09:47 ADDENDUM: These results were called by telephone at the time of interpretation on 01/02/2019 at 9:46 am to Dr. Marvel Plan , who verbally acknowledged these results. Electronically Signed   By: Marlan Palau M.D.   On: 01/02/2019 09:47   Result Date: 01/02/2019 CLINICAL DATA:  Intracranial hemorrhage EXAM: CT ANGIOGRAPHY HEAD AND NECK TECHNIQUE: Multidetector CT imaging of the head and neck was performed using the standard protocol during bolus administration of intravenous contrast. Multiplanar CT image reconstructions and MIPs were obtained to evaluate the vascular anatomy. Carotid stenosis measurements (when applicable) are obtained utilizing NASCET criteria, using the distal internal carotid diameter as the denominator. CONTRAST:   75mL OMNIPAQUE IOHEXOL 350 MG/ML SOLN COMPARISON:  CT head 01/01/2019 FINDINGS: CTA NECK FINDINGS Aortic arch: Standard branching. Imaged portion shows no evidence of aneurysm or dissection. No significant stenosis of the major arch vessel origins. Right carotid system: Normal right carotid system. No atherosclerotic disease stenosis or dissection Left carotid system: Normal left carotid system. Negative for atherosclerotic disease, stenosis, or dissection Vertebral arteries: Both vertebral arteries widely patent to the basilar without stenosis. Skeleton: Mild cervical spine degenerative change. No acute skeletal abnormality. Other neck: Left thyroid mass measuring 4.3 x 3.0 cm with irregular enhancement. Right lobe of the thyroid normal. No adenopathy in the neck. Upper chest: Negative Review of the MIP images confirms the above findings CTA HEAD FINDINGS Anterior circulation: No significant stenosis, proximal occlusion, aneurysm, or vascular malformation. Posterior circulation: No significant stenosis, proximal occlusion, aneurysm, or vascular malformation. Venous sinuses: Normal venous enhancement Anatomic variants: Negative for vascular malformation Delayed phase: Left thalamic hemorrhage shows mild progression now measuring 3.1 x 2.5 cm, compared with 2.6 x 2.3 cm. Interval intraventricular extension of hemorrhage with blood in the occipital horns bilaterally right greater than left. Ventricles remain mildly enlarged but stable and most likely due to atrophy. No enhancing mass lesion. Review of the MIP images confirms the above findings IMPRESSION: 1. Interval progression of hemorrhage  in the right thalamic with intraventricular extension. Mild ventricular enlargement is stable and consistent with atrophy. 2. Negative for vascular malformation. 3. 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  Biopsy recommended. Electronically Signed: By: Marlan Palau M.D. On: 01/02/2019 09:22   Ct Angio Neck W Or Wo  Contrast  Addendum Date: 01/02/2019   ADDENDUM REPORT: 01/02/2019 09:47 ADDENDUM: These results were called by telephone at the time of interpretation on 01/02/2019 at 9:46 am to Dr. Marvel Plan , who verbally acknowledged these results. Electronically Signed   By: Marlan Palau M.D.   On: 01/02/2019 09:47   Result Date: 01/02/2019 CLINICAL DATA:  Intracranial hemorrhage EXAM: CT ANGIOGRAPHY HEAD AND NECK TECHNIQUE: Multidetector CT imaging of the head and neck was performed using the standard protocol during bolus administration of intravenous contrast. Multiplanar CT image reconstructions and MIPs were obtained to evaluate the vascular anatomy. Carotid stenosis measurements (when applicable) are obtained utilizing NASCET criteria, using the distal internal carotid diameter as the denominator. CONTRAST:  75mL OMNIPAQUE IOHEXOL 350 MG/ML SOLN COMPARISON:  CT head 01/01/2019 FINDINGS: CTA NECK FINDINGS Aortic arch: Standard branching. Imaged portion shows no evidence of aneurysm or dissection. No significant stenosis of the major arch vessel origins. Right carotid system: Normal right carotid system. No atherosclerotic disease stenosis or dissection Left carotid system: Normal left carotid system. Negative for atherosclerotic disease, stenosis, or dissection Vertebral arteries: Both vertebral arteries widely patent to the basilar without stenosis. Skeleton: Mild cervical spine degenerative change. No acute skeletal abnormality. Other neck: Left thyroid mass measuring 4.3 x 3.0 cm with irregular enhancement. Right lobe of the thyroid normal. No adenopathy in the neck. Upper chest: Negative Review of the MIP images confirms the above findings CTA HEAD FINDINGS Anterior circulation: No significant stenosis, proximal occlusion, aneurysm, or vascular malformation. Posterior circulation: No significant stenosis, proximal occlusion, aneurysm, or vascular malformation. Venous sinuses: Normal venous enhancement Anatomic  variants: Negative for vascular malformation Delayed phase: Left thalamic hemorrhage shows mild progression now measuring 3.1 x 2.5 cm, compared with 2.6 x 2.3 cm. Interval intraventricular extension of hemorrhage with blood in the occipital horns bilaterally right greater than left. Ventricles remain mildly enlarged but stable and most likely due to atrophy. No enhancing mass lesion. Review of the MIP images confirms the above findings IMPRESSION: 1. Interval progression of hemorrhage in the right thalamic with intraventricular extension. Mild ventricular enlargement is stable and consistent with atrophy. 2. Negative for vascular malformation. 3. 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  Biopsy recommended. Electronically Signed: By: Marlan Palau M.D. On: 01/02/2019 09:22   Ct Head Code Stroke Wo Contrast  Result Date: 01/01/2019 CLINICAL DATA:  Code stroke. Last seen normal 1850 hours. Left-sided weakness and slurred speech. EXAM: CT HEAD WITHOUT CONTRAST TECHNIQUE: Contiguous axial images were obtained from the base of the skull through the vertex without intravenous contrast. COMPARISON:  12/31/2017 FINDINGS: Brain: 3.1 x 2.6 x 2.3 cm (volume = 9.7 cm^3) acute hemorrhage within the right lateral thalamus/posterior limb internal capsule region. Mild surrounding edema. No intraventricular penetration at this time. Atrophy and chronic small-vessel ischemic changes elsewhere. No sign of acute cortical stroke. No mass lesion, hydrocephalus or extra-axial collection. Vascular: No abnormal vascular finding. Skull: Negative Sinuses/Orbits: Clear/normal Other: None IMPRESSION: 1. 3.1 x 2.6 x 2.3 cm acute hemorrhage in the right lateral thalamus/posterior limb internal capsule with mild surrounding edema. This could be a primary hemorrhage or hemorrhagic infarction. 2. Atrophy and chronic small-vessel ischemic changes elsewhere. 3. ASPECTS is  not applicable in this setting. 4. These results were communicated to Dr. Laurence Slate at  8:02 pmon 3/24/2020by text page via the [facility] messaging system. Electronically Signed   By: Paulina Fusi M.D.   On: 01/01/2019 20:03    PHYSICAL EXAM  Temp:  [97.5 F (36.4 C)-98.1 F (36.7 C)] 98 F (36.7 C) (03/25 0800) Pulse Rate:  [62-92] 65 (03/25 1030) Resp:  [13-29] 22 (03/25 1030) BP: (106-158)/(72-107) 146/94 (03/25 1030) SpO2:  [90 %-99 %] 94 % (03/25 1030) Weight:  [361 kg] 103 kg (03/24 1900)  General - Well nourished, well developed, in no apparent distress.  Ophthalmologic - fundi not visualized due to noncooperation.  Cardiovascular - Regular rate and rhythm.  Mental Status -  Level of arousal and orientation to time, place, and person were intact. Language including expression, naming, repetition, comprehension was assessed and found intact. Mild dysarthria Fund of Knowledge was assessed and was intact.  Cranial Nerves II - XII - II - Visual field intact OU. III, IV, VI - Extraocular movements intact. V - Facial sensation mildly decreased on the left. VII - left facial droop VIII - Hearing & vestibular intact bilaterally. X - Palate elevates symmetrically. XI - Chin turning & shoulder shrug intact bilaterally. XII - Tongue protrusion intact.  Motor Strength - The patient's strength was normal in RUE and RLE, 0/5 LUE and LLE.  Bulk was normal and fasciculations were absent.   Motor Tone - Muscle tone was assessed at the neck and appendages and was normal.  Reflexes - The patient's reflexes were diminished on the left and he had no pathological reflexes.  Sensory - Light touch, temperature/pinprick were assessed and were diminished on the left.    Coordination - The patient had normal movements in the right hand with no ataxia or dysmetria.  Tremor was absent.  Gait and Station - deferred.   ASSESSMENT/PLAN Patrick Pollard is a 73 y.o. male with history of depression, remote GIB d/t polyp s/p resection presenting with L sided weakness. CT showed  hemorrhage.   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 Small vessel disease. Atrophy.     CTA head & neck interval progression R thalamic hmg w/ IVH. Mild ventricular enlargement. No AVMs. L thyroid mass.  CT repeat pending  2D Echo pending  LDL pending   HgbA1c pending  SCDs for VTE prophylaxis Diet - regular  No antithrombotic prior to admission, now on No antithrombotic given hemorrhage  Therapy recommendations:  pending   Disposition:  pending   Hypertensive Urgency  BP not stable with cardene  Started on cardene gtt for BP control  SBP goal < 140  Home meds:  triamterene-HCTZ 37.5-25 daily - resume home meds . Long-term BP goal normotensive  Hyperlipidemia  on liptor 40 PTA  LDL pending   Hold statin given hemorrhage  Anticipate resumption on d/c  Other Stroke Risk Factors  Advanced age  Former Cigarette smoker, quit 22 yrs ago  Hx ETOH use  Other Active Problems  Hx GIB d/t colon polyp s/p resection  Anxiety/depression on effexor, wellbutrin, abilify - resume home meds  BPH on flomax - resume home meds  L thyroid mass, bx recommended, need to do as outpt  Hospital day # 1  This patient is critically ill due to ICH with IVH, hypertensive urgency and at significant risk of neurological worsening, death form hematoma expansion, hydrocephalus,  hypertensive encephalopathy, seizure. This patient's care requires constant monitoring of vital signs, hemodynamics, respiratory and cardiac monitoring, review of multiple databases, neurological assessment, discussion with family, other specialists and medical decision making of high complexity. I spent 40 minutes of neurocritical care time in the care of this patient.  Marvel Plan, MD PhD Stroke Neurology 01/02/2019 11:36 AM  To contact Stroke Continuity provider, please refer to [email or web address]. After hours,  contact General Neurology

## 2019-01-02 NOTE — Progress Notes (Signed)
PT Cancellation Note  Patient Details Name: Patrick Pollard MRN: [id number] DOB: 25-May-1946   Cancelled Treatment:    Reason Eval/Treat Not Completed: Active bedrest order. Pt taken for CTA, revealed worsening compared to initial, pt to remain on bedrest. PT to return as able, as appropriate to complete PT eval.  Lewis Shock, PT, DPT Acute Rehabilitation Services Pager #: [phone] Office #: [phone]    Iona Hansen 01/02/2019, 10:11 AM

## 2019-01-02 NOTE — Progress Notes (Signed)
  Echocardiogram 2D Echocardiogram has been performed.  Patrick Pollard 01/02/2019, 9:47 AM

## 2019-01-02 NOTE — Care Management (Signed)
Pt is a veteran, but is unassigned and has no [facility] PCP.    His unassigned CSW is [employer], Pager:  [phone] Office:  [phone], ext. [phone]  Clinical update given to [facility] transfer coordinator.  Will follow progress.  Quintella Baton, RN, BSN  Trauma/Neuro ICU Case Manager [phone]

## 2019-01-02 NOTE — Evaluation (Signed)
Speech Language Pathology Evaluation Patient Details Name: Patrick Pollard MRN: [id number] DOB: Apr 02, 1946 Today's Date: 01/02/2019 Time: 6063-0160 SLP Time Calculation (min) (ACUTE ONLY): 35 min  Problem List:  Patient Active Problem List   Diagnosis Date Noted  . ICH (intracerebral hemorrhage) (HCC) 01/01/2019  . Chronic post-traumatic stress disorder (PTSD) 12/25/2018  . Major depressive disorder, recurrent episode, moderate (HCC) 12/04/2018  . GIB (gastrointestinal bleeding) 12/31/2017   Past Medical History:  Past Medical History:  Diagnosis Date  . Anxiety   . Colon polyps    s/p colonoscopy 12/2016 wtih polyp removal   . Depression    Past Surgical History: History reviewed. No pertinent surgical history. HPI:  Pt is a [name] with PMH of Depression, remote GI bleeding due to polyp s/p resection presented to ED as a stroke alert after increased left-sided weakness was noted and EMS was called. The CT of the head revealed 3.1 x 2.6 x 2.3 cm acute hemorrhage in the right lateral thalamus/posterior limb internal capsule with mild surrounding edema. Could be a primary hemorrhage or hemorrhagic infarction.   Assessment / Plan / Recommendation Clinical Impression  Pt participated in speech/language/cognition evaluation and he denied any baseline deficits in these areas. He stated that he has noticed some differences with regards to his memory and stated that his motor speech skills are currently 10% back to baseline. The [facility] Cognitive Assessment 8.1 was completed to evaluate the pt's cognitive-linguistic skills. He achieved a score of 21/30 which is below the normal limits of 26 or more out of 30 and is suggestive of a mild impairment. He demonstrated deficits in the areas of executive function, mental manipulation, abstract reasoning, and delayed recall. He also presents with mild-moderate dysarthria characterized by reduced articulatory precision which  negatively impacts speech intellegibility at the phrase and sentence levels. Skilled SLP services are clinically indicated at this time to improve cognition and dysarthria. Pt, and nursing were educated regarding results and recommendations; both parties verbalized understanding as well as agreement with plan of care.    SLP Assessment  SLP Recommendation/Assessment: Patient needs continued Speech Lanaguage Pathology Services SLP Visit Diagnosis: Cognitive communication deficit (R41.841);Dysarthria and anarthria (R47.1)    Follow Up Recommendations  Other (comment)(Continues SLP services at level of care rx by PT/OT)    Frequency and Duration min 2x/week  2 weeks      SLP Evaluation Cognition  Overall Cognitive Status: Impaired/Different from baseline Arousal/Alertness: Awake/alert Orientation Level: Oriented to person;Oriented to place;Oriented to situation;Disoriented to time(Disoriented to day; orientated to date, day) Attention: Focused;Sustained Focused Attention: Appears intact(Vigilance WNL: 1/1) Sustained Attention: Appears intact(Serial 7s: 2/3) Memory: Appears intact Awareness: Appears intact Problem Solving: Appears intact(4/4) Executive Function: Reasoning;Sequencing Reasoning: Impaired Reasoning Impairment: Verbal complex(Abstraction: 1/2) Sequencing: Impaired Sequencing Impairment: Verbal complex(Clock drawing: 2/3) Safety/Judgment: Appears intact       Comprehension  Auditory Comprehension Overall Auditory Comprehension: Appears within functional limits for tasks assessed Yes/No Questions: Within Functional Limits(Simple: 5/5; Complex: 5/5) Commands: Impaired One Step Basic Commands: (4/4) Two Step Basic Commands: (4/4) Multistep Basic Commands: (3/4) Complex Commands: (Trail completion: 0/1) Conversation: Complex Visual Recognition/Discrimination Discrimination: Within Function Limits Pictures: (Pictures: 5/5) Reading Comprehension Reading Status: Not  tested    Expression Expression Primary Mode of Expression: Verbal Verbal Expression Overall Verbal Expression: Appears within functional limits for tasks assessed Initiation: No impairment Automatic Speech: Counting;Day of week;Month of year(WNL) Level of Generative/Spontaneous Verbalization: Conversation;Sentence Repetition: Impaired Level of Impairment: Sentence level(Complex sentences: 1/2) Naming: No  impairment Pictures: (Pictures: 5/5) Pragmatics: No impairment   Oral / Motor  Oral Motor/Sensory Function Overall Oral Motor/Sensory Function: Mild impairment Facial ROM: Reduced left;Suspected CN VII (facial) dysfunction Facial Symmetry: Abnormal symmetry left;Suspected CN VII (facial) dysfunction Facial Strength: Reduced left;Suspected CN VII (facial) dysfunction Facial Sensation: Within Functional Limits Lingual ROM: Within Functional Limits Lingual Symmetry: Within Functional Limits Lingual Strength: Within Functional Limits Velum: Within Functional Limits Mandible: Within Functional Limits Motor Speech Overall Motor Speech: Appears within functional limits for tasks assessed Respiration: Within functional limits Phonation: Normal Resonance: Within functional limits Articulation: Impaired Level of Impairment: Sentence Intelligibility: Intelligibility reduced Word: 75-100% accurate Phrase: 75-100% accurate Sentence: 75-100% accurate Conversation: 75-100% accurate Motor Planning: Witnin functional limits Motor Speech Errors: Aware   Wendolyn Raso I. Vear Clock, MS, CCC-SLP Acute Rehabilitation Services Office number [phone] Pager [phone]                    Scheryl Marten 01/02/2019, 4:39 PM

## 2019-01-02 NOTE — Progress Notes (Signed)
OT Cancellation Note  Patient Details Name: Patrick Pollard MRN: [id number] DOB: 05/06/1946   Cancelled Treatment:    Reason Eval/Treat Not Completed: Active bedrest order(Will return as schedule allows. Thank you.)  [name] M [name] [name] MSOT, OTR/L Acute Rehab Pager: [phone] Office: [phone] 01/02/2019, 7:20 AM

## 2019-01-03 LAB — CBC
HCT: 47.5 % (ref 39.0–52.0)
Hemoglobin: 16.3 g/dL (ref 13.0–17.0)
MCH: 29.9 pg (ref 26.0–34.0)
MCHC: 34.3 g/dL (ref 30.0–36.0)
MCV: 87 fL (ref 80.0–100.0)
Platelets: 282 10*3/uL (ref 150–400)
RBC: 5.46 MIL/uL (ref 4.22–5.81)
RDW: 13.1 % (ref 11.5–15.5)
WBC: 10.1 10*3/uL (ref 4.0–10.5)
nRBC: 0 % (ref 0.0–0.2)

## 2019-01-03 LAB — HEMOGLOBIN A1C
Hgb A1c MFr Bld: 5.8 % — ABNORMAL HIGH (ref 4.8–5.6)
Mean Plasma Glucose: 119.76 mg/dL

## 2019-01-03 LAB — LIPID PANEL
Cholesterol: 185 mg/dL (ref 0–200)
HDL: 52 mg/dL (ref >40)
LDL Cholesterol: 111 mg/dL — ABNORMAL HIGH (ref 0–99)
Total CHOL/HDL Ratio: 3.6 RATIO
Triglycerides: 108 mg/dL (ref <150)
VLDL: 22 mg/dL (ref 0–40)

## 2019-01-03 LAB — BASIC METABOLIC PANEL
Anion gap: 11 (ref 5–15)
BUN: 9 mg/dL (ref 8–23)
CALCIUM: 9.4 mg/dL (ref 8.9–10.3)
CO2: 24 mmol/L (ref 22–32)
Chloride: 99 mmol/L (ref 98–111)
Creatinine, Ser: 0.63 mg/dL (ref 0.61–1.24)
GFR calc Af Amer: >60 mL/min (ref >60)
GFR calc non Af Amer: >60 mL/min (ref >60)
Glucose, Bld: 166 mg/dL — ABNORMAL HIGH (ref 70–99)
Potassium: 3.6 mmol/L (ref 3.5–5.1)
Sodium: 134 mmol/L — ABNORMAL LOW (ref 135–145)

## 2019-01-03 LAB — GLUCOSE, CAPILLARY: Glucose-Capillary: 181 mg/dL — ABNORMAL HIGH (ref 70–99)

## 2019-01-03 MED ORDER — LISINOPRIL 20 MG PO TABS: 20.0000 mg | ORAL_TABLET | Freq: Two times a day (BID) | ORAL | Status: DC

## 2019-01-03 MED ORDER — LABETALOL HCL 5 MG/ML IV SOLN: 10.0000 mg | INTRAVENOUS | Status: DC | PRN

## 2019-01-03 MED ORDER — LISINOPRIL 20 MG PO TABS
20.0000 mg | ORAL_TABLET | Freq: Every day | ORAL | Status: DC
  Administered 2019-01-03 – 2019-01-04 (×2): 20 mg via ORAL
  Filled 2019-01-03 (×2): qty 1

## 2019-01-03 NOTE — Progress Notes (Signed)
  Speech Language Pathology Treatment: (Dysarthria)  Patient Details Name: Patrick Pollard MRN: [id number] DOB: 1945-10-21 Today's Date: 01/03/2019 Time: 7035-0093 SLP Time Calculation (min) (ACUTE ONLY): 14 min  Assessment / Plan / Recommendation Clinical Impression  Pt reported that he has been nauseous today and therefore has not eaten. He was amenable to particiation in an abbreviated dysarthria sesison but requested that cognitive-linguistic treatment be deferred. He was educated regarding compensatory strategies to improve speech intelligibility and verbalized understanding. He was able to use these strategies at the phrase level with 90% accuracy and at the 8-9 word sentence level with 62% accuracy increasing to 100% accuracy with moderate cues for overarticulation and reduced speaking rate. Some generalization of use of strategies was noted at the conversational level but moderate cues were still required for consistency. SLP will continue to follow pt.    HPI HPI: Pt is a [name] with PMH of Depression, remote GI bleeding due to polyp s/p resection presented to ED as a stroke alert after increased left-sided weakness was noted and EMS was called. The CT of the head revealed 3.1 x 2.6 x 2.3 cm acute hemorrhage in the right lateral thalamus/posterior limb internal capsule with mild surrounding edema. Could be a primary hemorrhage or hemorrhagic infarction.      SLP Plan  Continue with current plan of care       Recommendations                   Follow up Recommendations: Inpatient Rehab SLP Visit Diagnosis: Cognitive communication deficit (R41.841);Dysarthria and anarthria (R47.1) Plan: Continue with current plan of care       [name], MS, CCC-SLP Acute Rehabilitation Services Office number [phone] Pager [phone]                Scheryl Marten 01/03/2019, 4:25 PM

## 2019-01-03 NOTE — Progress Notes (Signed)
PT Cancellation Note  Patient Details Name: [name] MRN: [id number] DOB: Aug 24, 1946   Cancelled Treatment:    Reason Eval/Treat Not Completed: Active bedrest order   Enedina Finner Donley Harland 01/03/2019, 7:00 AM  Delaney Meigs, PT Acute Rehabilitation Services Pager: [phone] Office: [phone]

## 2019-01-03 NOTE — Progress Notes (Signed)
STROKE TEAM PROGRESS NOTE   INTERVAL HISTORY Pt lying in bed, stating that no position makes him feel comfortable. Had repeat CT yesterday, stable hematoma. Will d/c bedrest and allow PT/OT work with him so that he may feel more comfortable. Still on cardene low dose. Will increase po BP meds.    Vitals:   01/03/19 0630 01/03/19 0700 01/03/19 0715 01/03/19 0800  BP: (!) 139/94 (!) 143/89 (!) 141/89 122/82  Pulse: 73 78 74 75  Resp: 20 20 20 19   Temp:    98.1 F (36.7 C)  TempSrc:    Oral  SpO2: 93% 93% 92% 93%  Weight:      Height:        CBC:  Recent Labs  Lab 01/01/19 1950 01/03/19 0420  WBC 7.8 10.1  NEUTROABS 4.3  --   HGB 14.5 16.3  HCT 45.5 47.5  MCV 89.9 87.0  PLT 269 282    Basic Metabolic Panel:  Recent Labs  Lab 01/01/19 1950 01/01/19 1957 01/03/19 0420  NA 135  --  134*  K 3.7  --  3.6  CL 103  --  99  CO2 24  --  24  GLUCOSE 107*  --  166*  BUN 11  --  9  CREATININE 0.76 0.70 0.63  CALCIUM 9.1  --  9.4   Lipid Panel:     Component Value Date/Time   CHOL 185 01/03/2019 0420   TRIG 108 01/03/2019 0420   HDL 52 01/03/2019 0420   CHOLHDL 3.6 01/03/2019 0420   VLDL 22 01/03/2019 0420   LDLCALC 111 (H) 01/03/2019 0420   HgbA1c:  Lab Results  Component Value Date   HGBA1C 5.8 (H) 01/03/2019   Urine Drug Screen: No results found for: LABOPIA, COCAINSCRNUR, LABBENZ, AMPHETMU, THCU, LABBARB  Alcohol Level No results found for: ETH  IMAGING Ct Angio Head W Or Wo Contrast  Addendum Date: 01/02/2019   ADDENDUM REPORT: 01/02/2019 09:47 ADDENDUM: These results were called by telephone at the time of interpretation on 01/02/2019 at 9:46 am to Dr. Marvel Plan , who verbally acknowledged these results. Electronically Signed   By: Marlan Palau M.D.   On: 01/02/2019 09:47   Result Date: 01/02/2019 CLINICAL DATA:  Intracranial hemorrhage EXAM: CT ANGIOGRAPHY HEAD AND NECK TECHNIQUE: Multidetector CT imaging of the head and neck was performed using the  standard protocol during bolus administration of intravenous contrast. Multiplanar CT image reconstructions and MIPs were obtained to evaluate the vascular anatomy. Carotid stenosis measurements (when applicable) are obtained utilizing NASCET criteria, using the distal internal carotid diameter as the denominator. CONTRAST:  84mL OMNIPAQUE IOHEXOL 350 MG/ML SOLN COMPARISON:  CT head 01/01/2019 FINDINGS: CTA NECK FINDINGS Aortic arch: Standard branching. Imaged portion shows no evidence of aneurysm or dissection. No significant stenosis of the major arch vessel origins. Right carotid system: Normal right carotid system. No atherosclerotic disease stenosis or dissection Left carotid system: Normal left carotid system. Negative for atherosclerotic disease, stenosis, or dissection Vertebral arteries: Both vertebral arteries widely patent to the basilar without stenosis. Skeleton: Mild cervical spine degenerative change. No acute skeletal abnormality. Other neck: Left thyroid mass measuring 4.3 x 3.0 cm with irregular enhancement. Right lobe of the thyroid normal. No adenopathy in the neck. Upper chest: Negative Review of the MIP images confirms the above findings CTA HEAD FINDINGS Anterior circulation: No significant stenosis, proximal occlusion, aneurysm, or vascular malformation. Posterior circulation: No significant stenosis, proximal occlusion, aneurysm, or vascular malformation. Venous sinuses: Normal venous  enhancement Anatomic variants: Negative for vascular malformation Delayed phase: Left thalamic hemorrhage shows mild progression now measuring 3.1 x 2.5 cm, compared with 2.6 x 2.3 cm. Interval intraventricular extension of hemorrhage with blood in the occipital horns bilaterally right greater than left. Ventricles remain mildly enlarged but stable and most likely due to atrophy. No enhancing mass lesion. Review of the MIP images confirms the above findings IMPRESSION: 1. Interval progression of hemorrhage in  the right thalamic with intraventricular extension. Mild ventricular enlargement is stable and consistent with atrophy. 2. Negative for vascular malformation. 3. 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  Biopsy recommended. Electronically Signed: By: Marlan Palau M.D. On: 01/02/2019 09:22   Ct Head Wo Contrast  Result Date: 01/02/2019 CLINICAL DATA:  Follow-up examination for known intracranial hemorrhage. EXAM: CT HEAD WITHOUT CONTRAST TECHNIQUE: Contiguous axial images were obtained from the base of the skull through the vertex without intravenous contrast. COMPARISON:  Prior CT from previous same day as well as 01/01/2019. FINDINGS: Brain: Right thalamic hemorrhage measures 3.1 x 2.7 x 3.4 cm (estimated volume 14 cc). Localized vasogenic edema is relatively similar. Associated intraventricular extension with blood in right greater than left lateral ventricles. Overall ventricular size is relatively stable as compared to previous exam without worsening hydrocephalus or ventricular entrapment. No midline shift. No other acute intracranial hemorrhage. No other acute large vessel territory infarct. Underlying atrophy with chronic microvascular ischemic disease again noted. No extra-axial fluid collection. Vascular: Contrast material seen within the intracranial vasculature related to prior CT. Skull: Scalp soft tissues and calvarium within normal limits. Sinuses/Orbits: Globes and orbital soft tissues demonstrate no acute finding. Paranasal sinuses and mastoid air cells are clear. Other: None. IMPRESSION: 1. Mild interval progression of right thalamic hemorrhage as compared to 01/01/2019, estimated volume 14 cc. Associated intraventricular extension with blood layering within the right greater than left lateral ventricles. Stable ventricular size without hydrocephalus or entrapment. 2. No other new acute intracranial abnormality. Electronically Signed   By: Rise Mu M.D.   On: 01/02/2019 17:31   Ct Angio  Neck W Or Wo Contrast  Addendum Date: 01/02/2019   ADDENDUM REPORT: 01/02/2019 09:47 ADDENDUM: These results were called by telephone at the time of interpretation on 01/02/2019 at 9:46 am to Dr. Marvel Plan , who verbally acknowledged these results. Electronically Signed   By: Marlan Palau M.D.   On: 01/02/2019 09:47   Result Date: 01/02/2019 CLINICAL DATA:  Intracranial hemorrhage EXAM: CT ANGIOGRAPHY HEAD AND NECK TECHNIQUE: Multidetector CT imaging of the head and neck was performed using the standard protocol during bolus administration of intravenous contrast. Multiplanar CT image reconstructions and MIPs were obtained to evaluate the vascular anatomy. Carotid stenosis measurements (when applicable) are obtained utilizing NASCET criteria, using the distal internal carotid diameter as the denominator. CONTRAST:  75mL OMNIPAQUE IOHEXOL 350 MG/ML SOLN COMPARISON:  CT head 01/01/2019 FINDINGS: CTA NECK FINDINGS Aortic arch: Standard branching. Imaged portion shows no evidence of aneurysm or dissection. No significant stenosis of the major arch vessel origins. Right carotid system: Normal right carotid system. No atherosclerotic disease stenosis or dissection Left carotid system: Normal left carotid system. Negative for atherosclerotic disease, stenosis, or dissection Vertebral arteries: Both vertebral arteries widely patent to the basilar without stenosis. Skeleton: Mild cervical spine degenerative change. No acute skeletal abnormality. Other neck: Left thyroid mass measuring 4.3 x 3.0 cm with irregular enhancement. Right lobe of the thyroid normal. No adenopathy in the neck. Upper chest: Negative Review of the MIP images  confirms the above findings CTA HEAD FINDINGS Anterior circulation: No significant stenosis, proximal occlusion, aneurysm, or vascular malformation. Posterior circulation: No significant stenosis, proximal occlusion, aneurysm, or vascular malformation. Venous sinuses: Normal venous  enhancement Anatomic variants: Negative for vascular malformation Delayed phase: Left thalamic hemorrhage shows mild progression now measuring 3.1 x 2.5 cm, compared with 2.6 x 2.3 cm. Interval intraventricular extension of hemorrhage with blood in the occipital horns bilaterally right greater than left. Ventricles remain mildly enlarged but stable and most likely due to atrophy. No enhancing mass lesion. Review of the MIP images confirms the above findings IMPRESSION: 1. Interval progression of hemorrhage in the right thalamic with intraventricular extension. Mild ventricular enlargement is stable and consistent with atrophy. 2. Negative for vascular malformation. 3. 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  Biopsy recommended. Electronically Signed: By: Marlan Palauharles  Clark M.D. On: 01/02/2019 09:22   Ct Head Code Stroke Wo Contrast  Result Date: 01/01/2019 CLINICAL DATA:  Code stroke. Last seen normal 1850 hours. Left-sided weakness and slurred speech. EXAM: CT HEAD WITHOUT CONTRAST TECHNIQUE: Contiguous axial images were obtained from the base of the skull through the vertex without intravenous contrast. COMPARISON:  12/31/2017 FINDINGS: Brain: 3.1 x 2.6 x 2.3 cm (volume = 9.7 cm^3) acute hemorrhage within the right lateral thalamus/posterior limb internal capsule region. Mild surrounding edema. No intraventricular penetration at this time. Atrophy and chronic small-vessel ischemic changes elsewhere. No sign of acute cortical stroke. No mass lesion, hydrocephalus or extra-axial collection. Vascular: No abnormal vascular finding. Skull: Negative Sinuses/Orbits: Clear/normal Other: None IMPRESSION: 1. 3.1 x 2.6 x 2.3 cm acute hemorrhage in the right lateral thalamus/posterior limb internal capsule with mild surrounding edema. This could be a primary hemorrhage or hemorrhagic infarction. 2. Atrophy and chronic small-vessel ischemic changes elsewhere. 3. ASPECTS is not applicable in this setting. 4. These results were  communicated to Dr. Laurence SlateAroor at 8:02 pmon 3/24/2020by text page via the [facility] messaging system. Electronically Signed   By: Paulina FusiMark  Shogry M.D.   On: 01/01/2019 20:03   2D Echocardiogram   1. The left ventricle has normal systolic function, with an ejection fraction of 55-60%. The cavity size was normal. There is moderately increased left ventricular wall thickness. Left ventricular diastolic Doppler parameters are consistent with  impaired relaxation. No evidence of left ventricular regional wall motion abnormalities.  2. The right ventricle has normal systolic function. The cavity was normal. There is no increase in right ventricular wall thickness.  3. Trivial calcification of the anterior mitral valve leaflet tip of the mitral valve leaflet.  4. The aortic valve is tricuspid Aortic valve regurgitation was not assessed by color flow Doppler.  5. There is mild dilatation of the ascending aorta measuring 39 mm.  6. The interatrial septum appears to be lipomatous.  PHYSICAL EXAM  Temp:  [97.7 F (36.5 C)-98.6 F (37 C)] 98.1 F (36.7 C) (03/26 0800) Pulse Rate:  [46-94] 75 (03/26 0800) Resp:  [13-27] 19 (03/26 0800) BP: (120-158)/(74-99) 122/82 (03/26 0800) SpO2:  [89 %-99 %] 93 % (03/26 0800)  General - Well nourished, well developed, in no apparent distress.  Ophthalmologic - fundi not visualized due to noncooperation.  Cardiovascular - Regular rate and rhythm.  Mental Status -  Level of arousal and orientation to time, place, and person were intact. Language including expression, naming, repetition, comprehension was assessed and found intact. Mild dysarthria Fund of Knowledge was assessed and was intact.  Cranial Nerves II - XII - II - Visual field intact  OU. III, IV, VI - Extraocular movements intact. V - Facial sensation mildly decreased on the left. VII - left facial droop VIII - Hearing & vestibular intact bilaterally. X - Palate elevates symmetrically. XI - Chin turning &  shoulder shrug intact bilaterally. XII - Tongue protrusion intact.  Motor Strength - The patient's strength was normal in RUE and RLE, 0/5 LUE and LLE.  Bulk was normal and fasciculations were absent.   Motor Tone - Muscle tone was assessed at the neck and appendages and was normal.  Reflexes - The patient's reflexes were diminished on the left and he had no pathological reflexes.  Sensory - Light touch, temperature/pinprick were assessed and were diminished on the left.    Coordination - The patient had normal movements in the right hand with no ataxia or dysmetria.  Tremor was absent.  Gait and Station - deferred.   ASSESSMENT/PLAN Mr. Jeremyah Guglielmo is a 73 y.o. male with history of depression, remote GIB d/t polyp s/p resection presenting with L sided weakness. CT showed hemorrhage.   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 Small vessel disease. Atrophy.     CTA head & neck interval progression R thalamic hmg w/ IVH. Mild ventricular enlargement. No AVMs. L thyroid mass.  CT repeat stable hematoma. R>L lat IVH. No hydrocephalus.  2D Echo EF 55-60%. No source of embolus. Lipomatous IAS  LDL 111  HgbA1c 5.8  SCDs for VTE prophylaxis Diet - regular  No antithrombotic prior to admission, now on No antithrombotic given hemorrhage  Therapy recommendations:  pending   Disposition:  pending   Hypertensive emergency  BP improved on cardene gtt for BP control  SBP goal < 160  Home meds:  triamterene-HCTZ 37.5-25 daily  resumed home meds and add lisinopril . Wean off cardene as able  . Long-term BP goal normotensive  Hyperlipidemia  on liptor 40 PTA  LDL 111  Hold statin given hemorrhage  Anticipate resumption on d/c  Other Stroke Risk Factors  Advanced age  Former Cigarette smoker, quit 22 yrs ago  Hx ETOH use  Other Active Problems  Hx GIB d/t colon  polyp s/p resection  Anxiety/depression on effexor, wellbutrin, abilify - resume home meds  BPH on flomax - resume home meds  L thyroid mass, bx recommended, need to do as outpt  Hospital day # 2  This patient is critically ill due to ICH with IVH, hypertensive urgency and at significant risk of neurological worsening, death form hematoma expansion, hydrocephalus, hypertensive encephalopathy, seizure. This patient's care requires constant monitoring of vital signs, hemodynamics, respiratory and cardiac monitoring, review of multiple databases, neurological assessment, discussion with family, other specialists and medical decision making of high complexity. I spent 35 minutes of neurocritical care time in the care of this patient.  Marvel Plan, MD PhD Stroke Neurology 01/03/2019 8:57 AM  To contact Stroke Continuity provider, please refer to [email or web address]. After hours, contact General Neurology

## 2019-01-03 NOTE — Evaluation (Signed)
Occupational Therapy Evaluation Patient Details Name: Patrick Pollard MRN: [id number] DOB: [birth year] Today's Date: 01/03/2019    History of Present Illness 73 yo admitted with left hemiplegia with Right basal ganglia hemorrhage with IVH. PMhx: GIB, anxiety/depression, BPH, left thyroid mass   Clinical Impression   This 73 y/o male presents with the above. At baseline pt is independent with ADL and functional mobility, reports he was active. Pt now presenting with significant L side weakness, decreased sitting and standing balance impacting his functional performance. Pt currently requiring +2 assist for sit<>stand from EOB, pt with strong L lateral lean and unable to safely attempt functional transfers - use of maxisky for totalA transfer OOB to recliner. He currently requires modA for UB ADL and max-totalA for LB ADL. Pt will benefit from continued acute OT services and feel he is an excellent candidate for CIR level services at time of discharge to maximize his safety and independence with ADL and mobility. Will follow.  BP monitored and stable throughout session: 131/88 start of session; 127/88 seated EOB   Follow Up Recommendations  CIR;Supervision/Assistance - 24 hour    Equipment Recommendations  Other (comment)(defer to next venue)    Recommendations for Other Services Rehab consult     Precautions / Restrictions Precautions Precautions: Fall Precaution Comments: L hemi Restrictions Weight Bearing Restrictions: No      Mobility Bed Mobility Overal bed mobility: Needs Assistance Bed Mobility: Rolling;Sidelying to Sit Rolling: Mod assist;+2 for safety/equipment Sidelying to sit: +2 for safety/equipment;+2 for physical assistance;Mod assist       General bed mobility comments: educated pt in use of R side to self-assist, assist for LEs over EOB and to elevate trunk, assist for sitting balance due to strong lateral lean  Transfers Overall transfer level: Needs  assistance Equipment used: 2 person hand held assist;Ambulation equipment used Transfers: Sit to/from Stand Sit to Stand: Max assist;+2 physical assistance;+2 safety/equipment         General transfer comment: pt able to stand from EOB with MaxA+2 via face to face method and L knee block; pt with increased difficulty facilitating full upright posture and with strong L lateral lean; unable to safely advance RLE to perform stand pivot transfer (and recliner with malfunctioning brakes) therefore deferred to use of maxisky for totalA transfer to recliner    Balance Overall balance assessment: Needs assistance Sitting-balance support: Feet supported;Single extremity supported Sitting balance-Leahy Scale: Poor Sitting balance - Comments: pt with strong L lateral lean in sitting, requires mod-maxA to maintain static balance Postural control: Left lateral lean Standing balance support: Single extremity supported Standing balance-Leahy Scale: Zero Standing balance comment: maxA+2 for standing balance                           ADL either performed or assessed with clinical judgement   ADL Overall ADL's : Needs assistance/impaired Eating/Feeding: Set up;Sitting Eating/Feeding Details (indicate cue type and reason): PT sent message to SLP for swallowing follow up as RN reports pt with pill resting on L side of mouth and pt unaware earlier today (due to decreased sensation) Grooming: Minimal assistance;Sitting Grooming Details (indicate cue type and reason): supported sitting Upper Body Bathing: Moderate assistance;Sitting   Lower Body Bathing: Maximal assistance;+2 for physical assistance;+2 for safety/equipment;Sit to/from stand   Upper Body Dressing : Moderate assistance;Maximal assistance;Sitting   Lower Body Dressing: Maximal assistance;Total assistance;+2 for physical assistance;+2 for safety/equipment;Sit to/from stand Lower Body Dressing Details (indicate cue type and  reason): totalA to don socks today Toilet Transfer: Total assistance;+2 for physical assistance;+2 for safety/equipment Toilet Transfer Details (indicate cue type and reason): simulated via transfer to recliner, use of maxisky for totalA transfer Toileting- Clothing Manipulation and Hygiene: Total assistance;+2 for physical assistance;+2 for safety/equipment       Functional mobility during ADLs: Maximal assistance;Total assistance;+2 for physical assistance;+2 for safety/equipment General ADL Comments: pt with L side weakness, decreased attention to L side, decreased sitting and standing balance     Vision Baseline Vision/History: No visual deficits Patient Visual Report: No change from baseline Additional Comments: some inattention to L side noted, though not severe      Perception Perception Comments: decreased awareness of where body is in relation to leaning L vs R   Praxis      Pertinent Vitals/Pain Pain Assessment: Faces Faces Pain Scale: Hurts little more Pain Location: generalized stiffness from being in bed Pain Descriptors / Indicators: Discomfort Pain Intervention(s): Monitored during session;Repositioned     Hand Dominance Right   Extremity/Trunk Assessment Upper Extremity Assessment Upper Extremity Assessment: LUE deficits/detail LUE Deficits / Details: grossly 0/5 at this time, decreased sensation, redness noted and mild edema LUE Sensation: decreased light touch LUE Coordination: decreased fine motor;decreased gross motor   Lower Extremity Assessment Lower Extremity Assessment: Defer to PT evaluation       Communication Communication Communication: No difficulties   Cognition Arousal/Alertness: Awake/alert Behavior During Therapy: WFL for tasks assessed/performed Overall Cognitive Status: Impaired/Different from baseline Area of Impairment: Problem solving;Awareness                           Awareness: Emergent Problem Solving: Slow  processing;Decreased initiation;Requires verbal cues;Requires tactile cues General Comments: pt overall appears close to [facility] for basic tasks and when providing PLOF, perseverating on not having his regular depression medication; slower processing noted; decreased awareness of L side - will continue to assess higher level cognition    General Comments       Exercises     Shoulder Instructions      Home Living Family/patient expects to be discharged to:: Private residence Living Arrangements: Spouse/significant other Available Help at Discharge: Family;Available 24 hours/day Type of Home: House Home Access: Ramped entrance     Home Layout: One level     Bathroom Shower/Tub: Producer, television/film/video: Handicapped height     Home Equipment: Environmental consultant - 2 wheels;Grab bars - tub/shower      Lives With: Spouse    Prior Functioning/Environment Level of Independence: Independent        Comments: active         OT Problem List: Decreased strength;Decreased range of motion;Decreased activity tolerance;Impaired balance (sitting and/or standing);Decreased coordination;Decreased cognition;Decreased safety awareness;Obesity;Impaired UE functional use;Pain;Impaired sensation;Decreased knowledge of use of DME or AE      OT Treatment/Interventions: Self-care/ADL training;Therapeutic exercise;Neuromuscular education;Energy conservation;DME and/or AE instruction;Therapeutic activities;Patient/family education;Balance training;Visual/perceptual remediation/compensation;Cognitive remediation/compensation;Splinting    OT Goals(Current goals can be found in the care plan section) Acute Rehab OT Goals Patient Stated Goal: regain his independence OT Goal Formulation: With patient Time For Goal Achievement: 01/17/19 Potential to Achieve Goals: Good  OT Frequency: Min 3X/week   Barriers to D/C:            Co-evaluation PT/OT/SLP Co-Evaluation/Treatment: Yes Reason for  Co-Treatment: For patient/therapist safety;To address functional/ADL transfers;Complexity of the patient's impairments (multi-system involvement) PT goals addressed during session: Mobility/safety with mobility;Balance OT goals addressed during  session: ADL's and self-care;Strengthening/ROM      AM-PAC OT "6 Clicks" Daily Activity     Outcome Measure Help from another person eating meals?: A Little Help from another person taking care of personal grooming?: A Little Help from another person toileting, which includes using toliet, bedpan, or urinal?: Total Help from another person bathing (including washing, rinsing, drying)?: A Lot Help from another person to put on and taking off regular upper body clothing?: A Lot Help from another person to put on and taking off regular lower body clothing?: Total 6 Click Score: 12   End of Session Equipment Utilized During Treatment: Gait belt Nurse Communication: Mobility status(pt requests nausea meds)  Activity Tolerance: Patient tolerated treatment well Patient left: in chair;with call bell/phone within reach;with chair alarm set  OT Visit Diagnosis: Unsteadiness on feet (R26.81);Other abnormalities of gait and mobility (R26.89);Muscle weakness (generalized) (M62.81);Hemiplegia and hemiparesis Hemiplegia - Right/Left: Left Hemiplegia - dominant/non-dominant: Non-Dominant Hemiplegia - caused by: Cerebral infarction                Time: 0951-1020 OT Time Calculation (min): 29 min Charges:  OT General Charges $OT Visit: 1 Visit OT Evaluation $OT Eval Moderate Complexity: 1 Mod  Marcy Siren, OT [employer] Pager [phone] Office [phone]   Orlando Penner 01/03/2019, 11:30 AM

## 2019-01-03 NOTE — H&P (Signed)
Physical Medicine and Rehabilitation Admission H&P    Chief Complaint  Patient presents with   Code Stroke   Chief complaint: left side weakness  HPI: Patrick Pollard is a 73 year old right-handed male with past history of GI bleed due to polyp status post resection 2018, hypertension maintained on Maxide, depression continues with Abilify, Effexor as well as Wellbutrin.per chart review patient lives with spouse. One level home with ramped entrance. Independent prior to admission. Presented 01/01/2019 with acute onset of left-sided weakness, mild dysarthria and facial droop while up to the bathroom. Blood pressure 155 systolic upon arrival to the ED. Denied any chest pain or shortness of breath. Cranial CT scan showed a 3.1 x 2.6 x 2.3 cm acute hemorrhage in the right lateral thalamus/posterior limb internal capsule mild surrounding edema. CT angiogram of head and neck negative for vascular malformation. 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 Echocardiogram with ejection fraction of 60%. Systolic function normal. Placed on Cardene for blood pressure control. Tolerating a regular diet. Therapy evaluations completed with recommendations of physical medicine rehabilitation consult. Patient was admitted for a comprehensive rehabilitation program.  Review of Systems  Constitutional: Negative for chills and fever.  HENT: Negative for hearing loss.   Eyes: Negative for blurred vision and double vision.  Respiratory: Negative for cough and shortness of breath.   Cardiovascular: Negative for chest pain and leg swelling.  Gastrointestinal: Positive for abdominal pain, constipation and nausea. Negative for heartburn and vomiting.  Genitourinary: Negative for dysuria, flank pain and hematuria.  Musculoskeletal: Positive for myalgias.  Skin: Negative for rash.  Neurological: Positive for dizziness, speech change and focal weakness.       Occasional headaches  Psychiatric/Behavioral:  Positive for depression. The patient has insomnia.   All other systems reviewed and are negative.  Past Medical History:  Diagnosis Date   Anxiety    Colon polyps    s/p colonoscopy 12/2016 wtih polyp removal    Depression    History reviewed. No pertinent surgical history. History reviewed. No pertinent family history. Social History:  reports that he quit smoking about 22 years ago. His smoking use included cigarettes. He quit after 37.00 years of use. He has never used smokeless tobacco. He reports previous alcohol use. He reports that he does not use drugs. Allergies:  Allergies  Allergen Reactions   Rocephin [Ceftriaxone] Other (See Comments)    Light headed, and nearly passed out   Medications Prior to Admission  Medication Sig Dispense Refill   ARIPiprazole (ABILIFY) 5 MG tablet Take 1 tablet (5 mg total) by mouth daily. 90 tablet 0   atorvastatin (LIPITOR) 40 MG tablet Take 40 mg by mouth daily.      cholecalciferol (VITAMIN D3) 25 MCG (1000 UT) tablet Take 1,000 Units by mouth daily.     LORazepam (ATIVAN) 1 MG tablet Take 1 mg by mouth 2 (two) times daily as needed for anxiety.     lubiprostone (AMITIZA) 24 MCG capsule Take 24 mcg by mouth 2 (two) times daily as needed for constipation.     Omega-3 Fatty Acids (FISH OIL) 1000 MG CPDR Take 1 capsule by mouth daily.     tamsulosin (FLOMAX) 0.4 MG CAPS capsule Take 0.4 mg by mouth at bedtime.     triamterene-hydrochlorothiazide (MAXZIDE-25) 37.5-25 MG tablet Take 1 tablet by mouth daily.     venlafaxine XR (EFFEXOR-XR) 150 MG 24 hr capsule Take 300 mg by mouth daily with breakfast.     vitamin  C (ASCORBIC ACID) 500 MG tablet Take 500 mg by mouth daily.     buPROPion (WELLBUTRIN XL) 300 MG 24 hr tablet Take 300 mg by mouth daily.      Drug Regimen Review Drug regimen was reviewed and remains appropriate with no significant issues identified  Home: Home Living Family/patient expects to be discharged to::  Private residence Living Arrangements: Spouse/significant other Available Help at Discharge: Family, Available 24 hours/day Type of Home: House Home Access: Ramped entrance Home Layout: One level Bathroom Shower/Tub: Health visitor: Handicapped height Home Equipment: Environmental consultant - 2 wheels, Grab bars - tub/shower  Lives With: Spouse   Functional History: Prior Function Level of Independence: Independent Comments: active   Functional Status:  Mobility: Bed Mobility Overal bed mobility: Needs Assistance Bed Mobility: Rolling, Sidelying to Sit Rolling: Mod assist, +2 for safety/equipment Sidelying to sit: +2 for safety/equipment, +2 for physical assistance, Mod assist General bed mobility comments: educated pt in use of R side to self-assist, RUE to grasp rail and RLE hooking LE with assist for LEs over EOB and to elevate trunk, assist for sitting balance due to strong lateral lean Transfers Overall transfer level: Needs assistance Equipment used: 2 person hand held assist, Ambulation equipment used Transfer via Lift Equipment: Maxisky Transfers: Sit to/from Stand Sit to Stand: Max assist, +2 physical assistance, +2 safety/equipment General transfer comment: pt able to stand from EOB with MaxA+2 via face to face method and L knee block; pt with increased difficulty facilitating full upright posture and with strong L lateral lean; unable to safely advance RLE to perform stand pivot transfer (and recliner with malfunctioning brakes) therefore deferred to use of maxisky for totalA transfer to recliner Ambulation/Gait General Gait Details: unable    ADL: ADL Overall ADL's : Needs assistance/impaired Eating/Feeding: Set up, Sitting Eating/Feeding Details (indicate cue type and reason): PT sent message to SLP for swallowing follow up as RN reports pt with pill resting on L side of mouth and pt unaware earlier today (due to decreased sensation) Grooming: Minimal assistance,  Sitting Grooming Details (indicate cue type and reason): supported sitting Upper Body Bathing: Moderate assistance, Sitting Lower Body Bathing: Maximal assistance, +2 for physical assistance, +2 for safety/equipment, Sit to/from stand Upper Body Dressing : Moderate assistance, Maximal assistance, Sitting Lower Body Dressing: Maximal assistance, Total assistance, +2 for physical assistance, +2 for safety/equipment, Sit to/from stand Lower Body Dressing Details (indicate cue type and reason): totalA to don socks today Toilet Transfer: Total assistance, +2 for physical assistance, +2 for safety/equipment Toilet Transfer Details (indicate cue type and reason): simulated via transfer to recliner, use of maxisky for totalA transfer Toileting- Clothing Manipulation and Hygiene: Total assistance, +2 for physical assistance, +2 for safety/equipment Functional mobility during ADLs: Maximal assistance, Total assistance, +2 for physical assistance, +2 for safety/equipment General ADL Comments: pt with L side weakness, decreased attention to L side, decreased sitting and standing balance  Cognition: Cognition Overall Cognitive Status: Impaired/Different from baseline Arousal/Alertness: Awake/alert Orientation Level: Oriented X4 Attention: Focused, Sustained Focused Attention: Appears intact(Vigilance WNL: 1/1) Sustained Attention: Appears intact(Serial 7s: 2/3) Memory: Appears intact Awareness: Appears intact Problem Solving: Appears intact(4/4) Executive Function: Reasoning, Sequencing Reasoning: Impaired Reasoning Impairment: Verbal complex(Abstraction: 1/2) Sequencing: Impaired Sequencing Impairment: Verbal complex(Clock drawing: 2/3) Safety/Judgment: Appears intact Cognition Arousal/Alertness: Awake/alert Behavior During Therapy: WFL for tasks assessed/performed Overall Cognitive Status: Impaired/Different from baseline Area of Impairment: Problem solving, Awareness Awareness:  Emergent Problem Solving: Slow processing, Decreased initiation, Requires verbal cues, Requires tactile cues  General Comments: perseverating on not having his regular depression medication; slower processing noted; decreased awareness of L side   Physical Exam: Blood pressure (!) 139/92, pulse 79, temperature 97.9 F (36.6 C), temperature source Oral, resp. rate (!) 22, height 6\' 1"  (1.854 m), weight 103 kg, SpO2 91 %. Physical Exam  Constitutional: He is oriented to person, place, and time. He appears well-developed and well-nourished.  HENT:  Head: Normocephalic and atraumatic.  Eyes: Pupils are equal, round, and reactive to light. Conjunctivae and EOM are normal.  Neck: Normal range of motion. No tracheal deviation present. No thyromegaly present.  Cardiovascular: Normal rate and regular rhythm. Exam reveals no friction rub.  No murmur heard. Respiratory: Effort normal. No respiratory distress. He has no wheezes. He has no rales.  GI: Soft. He exhibits no distension. There is no abdominal tenderness.  Musculoskeletal:        General: No edema.  Neurological: He is alert and oriented to person, place, and time.  Patient is alert and cooperative. Speech is dysarthric. Follows commands. A little distracted. Fair awareness of deficits. He can provide his name, age and date of birth. Left central VII and XII,  LUE 1-2/5 prox to distal. LLE 0-tr/5. RUE and RLE 4+/5. Sensory trace/2 LUE and LLE. DTR's 2++ on left. Early flexor tone at left wrist, extensor tone left heel.   Skin: Skin is warm. No rash noted. No erythema.  Psychiatric: He has a normal mood and affect. His behavior is normal.    Results for orders placed or performed during the hospital encounter of 01/01/19 (from the past 48 hour(s))  Protime-INR     Status: None   Collection Time: 01/01/19  7:50 PM  Result Value Ref Range   Prothrombin Time 14.1 11.4 - 15.2 seconds   INR 1.1 0.8 - 1.2    Comment: (NOTE) INR goal varies  based on device and disease states. Performed at [facility], [address], [place], [place] [address]   APTT     Status: None   Collection Time: 01/01/19  7:50 PM  Result Value Ref Range   aPTT 30 24 - 36 seconds    Comment: Performed at [facility], [address], [place], [place] [address]  CBC     Status: None   Collection Time: 01/01/19  7:50 PM  Result Value Ref Range   WBC 7.8 4.0 - 10.5 K/uL   RBC 5.06 4.22 - 5.81 MIL/uL   Hemoglobin 14.5 13.0 - 17.0 g/dL   HCT 82.9 56.2 - 13.0 %   MCV 89.9 80.0 - 100.0 fL   MCH 28.7 26.0 - 34.0 pg   MCHC 31.9 30.0 - 36.0 g/dL   RDW 86.5 78.4 - 69.6 %   Platelets 269 150 - 400 K/uL   nRBC 0.0 0.0 - 0.2 %    Comment: Performed at [facility], [address] [place], [place] [address]  Differential     Status: None   Collection Time: 01/01/19  7:50 PM  Result Value Ref Range   Neutrophils Relative % 55 %   Neutro Abs 4.3 1.7 - 7.7 K/uL   Lymphocytes Relative 34 %   Lymphs Abs 2.6 0.7 - 4.0 K/uL   Monocytes Relative 8 %   Monocytes Absolute 0.7 0.1 - 1.0 K/uL   Eosinophils Relative 2 %   Eosinophils Absolute 0.2 0.0 - 0.5 K/uL   Basophils Relative 1 %   Basophils Absolute 0.1  0.0 - 0.1 K/uL   Immature Granulocytes 0 %   Abs Immature Granulocytes 0.02 0.00 - 0.07 K/uL    Comment: Performed at [facility], [address], [place], [place] [address]  Comprehensive metabolic panel     Status: Abnormal   Collection Time: 01/01/19  7:50 PM  Result Value Ref Range   Sodium 135 135 - 145 mmol/L   Potassium 3.7 3.5 - 5.1 mmol/L   Chloride 103 98 - 111 mmol/L   CO2 24 22 - 32 mmol/L   Glucose, Bld 107 (H) 70 - 99 mg/dL   BUN 11 8 - 23 mg/dL   Creatinine, Ser 5.62 0.61 - 1.24 mg/dL   Calcium 9.1 8.9 - 13.0 mg/dL   Total Protein 7.0 6.5 - 8.1 g/dL   Albumin 4.0 3.5 - 5.0 g/dL   AST 20 15 - 41 U/L   ALT 16 0 - 44 U/L   Alkaline Phosphatase 45 38 - 126 U/L   Total Bilirubin 0.6 0.3 - 1.2 mg/dL   GFR calc  non Af Amer >60 >60 mL/min   GFR calc Af Amer >60 >60 mL/min   Anion gap 8 5 - 15    Comment: Performed at [facility], [address], [place], [place] [address]  Glucose, capillary     Status: Abnormal   Collection Time: 01/01/19  7:51 PM  Result Value Ref Range   Glucose-Capillary 113 (H) 70 - 99 mg/dL  I-stat Creatinine, ED     Status: None   Collection Time: 01/01/19  7:57 PM  Result Value Ref Range   Creatinine, Ser 0.70 0.61 - 1.24 mg/dL  MRSA PCR Screening     Status: None   Collection Time: 01/01/19  9:37 PM  Result Value Ref Range   MRSA by PCR NEGATIVE NEGATIVE    Comment:        The GeneXpert MRSA Assay (FDA approved for NASAL specimens only), is one component of a comprehensive MRSA colonization surveillance program. It is not intended to diagnose MRSA infection nor to guide or monitor treatment for MRSA infections. Performed at [facility], [address], [place], [place] [address]   CBC     Status: None   Collection Time: 01/03/19  4:20 AM  Result Value Ref Range   WBC 10.1 4.0 - 10.5 K/uL   RBC 5.46 4.22 - 5.81 MIL/uL   Hemoglobin 16.3 13.0 - 17.0 g/dL   HCT 95.2 84.1 - 32.4 %   MCV 87.0 80.0 - 100.0 fL   MCH 29.9 26.0 - 34.0 pg   MCHC 34.3 30.0 - 36.0 g/dL   RDW 40.1 02.7 - 25.3 %   Platelets 282 150 - 400 K/uL   nRBC 0.0 0.0 - 0.2 %    Comment: Performed at [facility], [address], [place], [place] [address]  Basic metabolic panel     Status: Abnormal   Collection Time: 01/03/19  4:20 AM  Result Value Ref Range   Sodium 134 (L) 135 - 145 mmol/L   Potassium 3.6 3.5 - 5.1 mmol/L   Chloride 99 98 - 111 mmol/L   CO2 24 22 - 32 mmol/L   Glucose, Bld 166 (H) 70 - 99 mg/dL   BUN 9 8 - 23 mg/dL   Creatinine, Ser 3.47 0.61 - 1.24 mg/dL   Calcium 9.4 8.9 - 42.5 mg/dL   GFR calc non Af Amer >60 >60 mL/min   GFR  calc Af Amer >60 >60 mL/min   Anion gap 11 5 - 15    Comment: Performed at [facility], [address],  [place], [place] [address]  Lipid panel     Status: Abnormal   Collection Time: 01/03/19  4:20 AM  Result Value Ref Range   Cholesterol 185 0 - 200 mg/dL   Triglycerides 604 <540 mg/dL   HDL 52 >98 mg/dL   Total CHOL/HDL Ratio 3.6 RATIO   VLDL 22 0 - 40 mg/dL   LDL Cholesterol 119 (H) 0 - 99 mg/dL    Comment:        Total Cholesterol/HDL:CHD Risk Coronary Heart Disease Risk Table                     Men   Women  1/2 Average Risk   3.4   3.3  Average Risk       5.0   4.4  2 X Average Risk   9.6   7.1  3 X Average Risk  23.4   11.0        Use the calculated Patient Ratio above and the CHD Risk Table to determine the patient's CHD Risk.        ATP III CLASSIFICATION (LDL):  <100     mg/dL   Optimal  147-829  mg/dL   Near or Above                    Optimal  130-159  mg/dL   Borderline  562-130  mg/dL   High  >865     mg/dL   Very High Performed at [facility], [address], [place], [place] [address]   Hemoglobin A1c     Status: Abnormal   Collection Time: 01/03/19  4:20 AM  Result Value Ref Range   Hgb A1c MFr Bld 5.8 (H) 4.8 - 5.6 %    Comment: (NOTE) Pre diabetes:          5.7%-6.4% Diabetes:              >6.4% Glycemic control for   <7.0% adults with diabetes    Mean Plasma Glucose 119.76 mg/dL    Comment: Performed at [facility], [address] [place], [place] [address]  Glucose, capillary     Status: Abnormal   Collection Time: 01/03/19 11:35 AM  Result Value Ref Range   Glucose-Capillary 181 (H) 70 - 99 mg/dL   Ct Angio Head W Or Wo Contrast  Addendum Date: 01/02/2019   ADDENDUM REPORT: 01/02/2019 09:47 ADDENDUM: These results were called by telephone at the time of interpretation on 01/02/2019 at 9:46 am to Dr. Marvel Plan , who verbally acknowledged these results. Electronically Signed   By: Marlan Palau M.D.   On: 01/02/2019 09:47   Result Date: 01/02/2019 CLINICAL DATA:  Intracranial hemorrhage EXAM: CT ANGIOGRAPHY HEAD AND NECK TECHNIQUE:  Multidetector CT imaging of the head and neck was performed using the standard protocol during bolus administration of intravenous contrast. Multiplanar CT image reconstructions and MIPs were obtained to evaluate the vascular anatomy. Carotid stenosis measurements (when applicable) are obtained utilizing NASCET criteria, using the distal internal carotid diameter as the denominator. CONTRAST:  75mL OMNIPAQUE IOHEXOL 350 MG/ML SOLN COMPARISON:  CT head 01/01/2019 FINDINGS: CTA NECK FINDINGS Aortic arch: Standard branching. Imaged portion shows no evidence of aneurysm or dissection. No significant stenosis of the major arch vessel origins. Right carotid  system: Normal right carotid system. No atherosclerotic disease stenosis or dissection Left carotid system: Normal left carotid system. Negative for atherosclerotic disease, stenosis, or dissection Vertebral arteries: Both vertebral arteries widely patent to the basilar without stenosis. Skeleton: Mild cervical spine degenerative change. No acute skeletal abnormality. Other neck: Left thyroid mass measuring 4.3 x 3.0 cm with irregular enhancement. Right lobe of the thyroid normal. No adenopathy in the neck. Upper chest: Negative Review of the MIP images confirms the above findings CTA HEAD FINDINGS Anterior circulation: No significant stenosis, proximal occlusion, aneurysm, or vascular malformation. Posterior circulation: No significant stenosis, proximal occlusion, aneurysm, or vascular malformation. Venous sinuses: Normal venous enhancement Anatomic variants: Negative for vascular malformation Delayed phase: Left thalamic hemorrhage shows mild progression now measuring 3.1 x 2.5 cm, compared with 2.6 x 2.3 cm. Interval intraventricular extension of hemorrhage with blood in the occipital horns bilaterally right greater than left. Ventricles remain mildly enlarged but stable and most likely due to atrophy. No enhancing mass lesion. Review of the MIP images confirms the  above findings IMPRESSION: 1. Interval progression of hemorrhage in the right thalamic with intraventricular extension. Mild ventricular enlargement is stable and consistent with atrophy. 2. Negative for vascular malformation. 3. 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  Biopsy recommended. Electronically Signed: By: Charles  Clark M.D. On: 01/02/2019 09:22   Ct Head Wo Contrast  Result Date: 01/02/2019 CLINICAL DATA:  Follow-up examination for known intracranial hemorrhage. EXAM: CT HEAD WITHOUT CONTRAST TECHNIQUE: Contiguous axial images were obtained from the base of the skull through the vertex without intravenous contrast. COMPARISON:  Prior CT from previous same day as well as 01/01/2019. FINDINGS: Brain: Right thalamic hemorrhage measures 3.1 x 2.7 x 3.4 cm (estimated volume 14 cc). Localized vasogenic edema is relatively similar. Associated intraventricular extension with blood in right greater than left lateral ventricles. Overall ventricular size is relatively stable as compared to previous exam without worsening hydrocephalus or ventricular entrapment. No midline shift. No other acute intracranial hemorrhage. No other acute large vessel territory infarct. Underlying atrophy with chronic microvascular ischemic disease again noted. No extra-axial fluid collection. Vascular: Contrast material seen within the intracranial vasculature related to prior CT. Skull: Scalp soft tissues and calvarium within normal limits. Sinuses/Orbits: Globes and orbital soft tissues demonstrate no acute finding. Paranasal sinuses and mastoid air cells are clear. Other: None. IMPRESSION: 1. Mild interval progression of right thalamic hemorrhage as compared to 01/01/2019, estimated volume 14 cc. Associated intraventricular extension with blood layering within the right greater than left lateral ventricles. Stable ventricular size without hydrocephalus or entrapment. 2. No other new acute intracranial abnormality. Electronically Signed    By: Aarron  McClintock M.D.   On: 01/02/2019 17:31   Ct Angio Neck W Or Wo Contrast  Addendum Date: 01/02/2019   ADDENDUM REPORT: 01/02/2019 09:47 ADDENDUM: These results were called by telephone at the time of interpretation on 01/02/2019 at 9:46 am to Dr. JINDONG XU , who verbally acknowledged these results. Electronically Signed   By: Charles  Clark M.D.   On: 01/02/2019 09:47   Result Date: 01/02/2019 CLINICAL DATA:  Intracranial hemorrhage EXAM: CT ANGIOGRAPHY HEAD AND NECK TECHNIQUE: Multidetector CT imaging of the head and neck was performed using the standard protocol during bolus administration of intravenous contrast. Multiplanar CT image reconstructions and MIPs were obtained to evaluate the vascular anatomy. Carotid stenosis measurements (when applicable) are obtained utilizing NASCET criteria, using the distal internal carotid diameter as the denominator. CONTRAST:  64456mmL OMNIPAQUE IOHEXOL 350 MG/ML SOLN  COMPARISON:  CT head 01/01/2019 FINDINGS: CTA NECK FINDINGS Aortic arch: Standard branching. Imaged portion shows no evidence of aneurysm or dissection. No significant stenosis of the major arch vessel origins. Right carotid system: Normal right carotid system. No atherosclerotic disease stenosis or dissection Left carotid system: Normal left carotid system. Negative for atherosclerotic disease, stenosis, or dissection Vertebral arteries: Both vertebral arteries widely patent to the basilar without stenosis. Skeleton: Mild cervical spine degenerative change. No acute skeletal abnormality. Other neck: Left thyroid mass measuring 4.3 x 3.0 cm with irregular enhancement. Right lobe of the thyroid normal. No adenopathy in the neck. Upper chest: Negative Review of the MIP images confirms the above findings CTA HEAD FINDINGS Anterior circulation: No significant stenosis, proximal occlusion, aneurysm, or vascular malformation. Posterior circulation: No significant stenosis, proximal occlusion, aneurysm,  or vascular malformation. Venous sinuses: Normal venous enhancement Anatomic variants: Negative for vascular malformation Delayed phase: Left thalamic hemorrhage shows mild progression now measuring 3.1 x 2.5 cm, compared with 2.6 x 2.3 cm. Interval intraventricular extension of hemorrhage with blood in the occipital horns bilaterally right greater than left. Ventricles remain mildly enlarged but stable and most likely due to atrophy. No enhancing mass lesion. Review of the MIP images confirms the above findings IMPRESSION: 1. Interval progression of hemorrhage in the right thalamic with intraventricular extension. Mild ventricular enlargement is stable and consistent with atrophy. 2. Negative for vascular malformation. 3. 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  Biopsy recommended. Electronically Signed: By: Marlan Palau M.D. On: 01/02/2019 09:22   Ct Head Code Stroke Wo Contrast  Result Date: 01/01/2019 CLINICAL DATA:  Code stroke. Last seen normal 1850 hours. Left-sided weakness and slurred speech. EXAM: CT HEAD WITHOUT CONTRAST TECHNIQUE: Contiguous axial images were obtained from the base of the skull through the vertex without intravenous contrast. COMPARISON:  12/31/2017 FINDINGS: Brain: 3.1 x 2.6 x 2.3 cm (volume = 9.7 cm^3) acute hemorrhage within the right lateral thalamus/posterior limb internal capsule region. Mild surrounding edema. No intraventricular penetration at this time. Atrophy and chronic small-vessel ischemic changes elsewhere. No sign of acute cortical stroke. No mass lesion, hydrocephalus or extra-axial collection. Vascular: No abnormal vascular finding. Skull: Negative Sinuses/Orbits: Clear/normal Other: None IMPRESSION: 1. 3.1 x 2.6 x 2.3 cm acute hemorrhage in the right lateral thalamus/posterior limb internal capsule with mild surrounding edema. This could be a primary hemorrhage or hemorrhagic infarction. 2. Atrophy and chronic small-vessel ischemic changes elsewhere. 3. ASPECTS is not  applicable in this setting. 4. These results were communicated to Dr. Laurence Slate at 8:02 pmon 3/24/2020by text page via the [facility] messaging system. Electronically Signed   By: Paulina Fusi M.D.   On: 01/01/2019 20:03       Medical Problem List and Plan: 1.  Left side weakness with facial droop as well as left hemi-sensory loss secondary to right thalamic hemorrhage with IVH secondary to hypertensive crisis  -admit to inpatient   -left WHO and PRAFO ordered 2.  Antithrombotics: -DVT/anticoagulation:  SCDs  -antiplatelet therapy: N/A 3. Pain Management: Tylenol as needed 4. Mood:  Abilify 5 mg daily, Effexor 300 mg daily. Provide emotional support  -antipsychotic agents: N/A 5. Neuropsych: This patient is capable of making decisions on his own behalf. 6. Skin/Wound Care:  Routine skin checks 7. Fluids/Electrolytes/Nutrition:  Routine ins and outs with follow-up chemistries 8. Lisinopril 20 mg daily, Maxide 37.5-25 mg daily. Monitor with increased mobility 9. BPH. Flomax 0.4 mg daily. 10. History of GI bleed . Continue Protonix  -pt reports  persistent nausea and is interested in bringing a supplement in from home which works for him. I told him we could review herb when it's here.  11. Hyperlipidemia. Resume Lipitor on discharge.       Mcarthur Rossetti Angiulli, PA-C 01/03/2019

## 2019-01-03 NOTE — Progress Notes (Signed)
Rehab Admissions Coordinator Note:  Patient was screened by Clois Dupes for appropriateness for an Inpatient Acute Rehab Consult per OT recs.  At this time, we are recommending Inpatient Rehab consult.  Clois Dupes RN MSN 01/03/2019, 11:52 AM  I can be reached at [phone].

## 2019-01-03 NOTE — Evaluation (Signed)
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 PMhx: GIB, anxiety/depression, BPH, left thyroid mass  Clinical Impression  Pt pleasant and eager to mobilize stating he cannot get comfortable in bed. Pt having a hard time coming to terms with his current function and lack of movement of left side. Pt previously very independent living at home with wife and daughter gardening and work with [employer]. Pt with dense left hemiplegia with impaired balance, sense of center, decreased sensation and awareness of left side who will benefit from acute therapy to maximize mobility, function, balance and safety to decrease burden of care.  See OT note for BP    Follow Up Recommendations CIR;Supervision/Assistance - 24 hour    Equipment Recommendations  Wheelchair (measurements PT)    Recommendations for Other Services OT consult;Speech consult;Rehab consult     Precautions / Restrictions Precautions Precautions: Fall Precaution Comments: L hemi, SbP <140 Restrictions Weight Bearing Restrictions: No      Mobility  Bed Mobility Overal bed mobility: Needs Assistance Bed Mobility: Rolling;Sidelying to Sit Rolling: Mod assist;+2 for safety/equipment Sidelying to sit: +2 for safety/equipment;+2 for physical assistance;Mod assist       General bed mobility comments: educated pt in use of R side to self-assist, RUE to grasp rail and RLE hooking LE with assist for LEs over EOB and to elevate trunk, assist for sitting balance due to strong lateral lean  Transfers Overall transfer level: Needs assistance Equipment used: 2 person hand held assist;Ambulation equipment used Transfers: Sit to/from Stand Sit to Stand: Max assist;+2 physical assistance;+2 safety/equipment         General transfer comment: pt able to  stand from EOB with MaxA+2 via face to face method and L knee block; pt with increased difficulty facilitating full upright posture and with strong L lateral lean; unable to safely advance RLE to perform stand pivot transfer (and recliner with malfunctioning brakes) therefore deferred to use of maxisky for totalA transfer to recliner  Ambulation/Gait             General Gait Details: unable  Stairs            Wheelchair Mobility    Modified Rankin (Stroke Patients Only) Modified Rankin (Stroke Patients Only) Pre-Morbid Rankin Score: No symptoms Modified Rankin: Severe disability     Balance Overall balance assessment: Needs assistance Sitting-balance support: Feet supported;Single extremity supported Sitting balance-Leahy Scale: Poor Sitting balance - Comments: pt with strong L lateral lean in sitting, requires mod-maxA to maintain static balance, Improved midline with cues and use of RUE to pull on foot board Postural control: Left lateral lean Standing balance support: Single extremity supported Standing balance-Leahy Scale: Zero Standing balance comment: maxA+2 for standing balance                             Pertinent Vitals/Pain Pain Assessment: Faces Faces Pain Scale: Hurts little more Pain Location: generalized stiffness from being in bed Pain Descriptors / Indicators: Discomfort Pain Intervention(s): Limited activity within patient's tolerance;Monitored during session;Repositioned    Home Living Family/patient expects to be discharged to:: Private residence Living Arrangements: Spouse/significant other Available Help at Discharge: Family;Available 24 hours/day Type of Home: House Home Access: Ramped entrance     Home Layout: One level Home Equipment: Walker - 2 wheels;Grab bars -  tub/shower      Prior Function Level of Independence: Independent         Comments: active      Hand Dominance   Dominant Hand: Right     Extremity/Trunk Assessment   Upper Extremity Assessment Upper Extremity Assessment: Defer to OT evaluation LUE Deficits / Details: grossly 0/5 at this time, decreased sensation, redness noted and mild edema LUE Sensation: decreased light touch LUE Coordination: decreased fine motor;decreased gross motor    Lower Extremity Assessment Lower Extremity Assessment: LLE deficits/detail LLE Deficits / Details: no noted sensation or movement throughout session with dense hemiplegia    Cervical / Trunk Assessment Cervical / Trunk Assessment: Other exceptions Cervical / Trunk Exceptions: pt with rounded shoulders left lean  Communication   Communication: No difficulties  Cognition Arousal/Alertness: Awake/alert Behavior During Therapy: WFL for tasks assessed/performed Overall Cognitive Status: Impaired/Different from baseline Area of Impairment: Problem solving;Awareness                           Awareness: Emergent Problem Solving: Slow processing;Decreased initiation;Requires verbal cues;Requires tactile cues General Comments: perseverating on not having his regular depression medication; slower processing noted; decreased awareness of L side       General Comments      Exercises     Assessment/Plan    PT Assessment Patient needs continued PT services  PT Problem List Decreased strength;Decreased balance;Decreased cognition;Decreased mobility;Decreased activity tolerance;Decreased coordination;Decreased safety awareness;Impaired sensation       PT Treatment Interventions DME instruction;Functional mobility training;Balance training;Patient/family education;Stair training;Gait training;Therapeutic activities;Neuromuscular re-education;Cognitive remediation    PT Goals (Current goals can be found in the Care Plan section)  Acute Rehab PT Goals Patient Stated Goal: regain his independence PT Goal Formulation: With patient Time For Goal Achievement:  01/17/19 Potential to Achieve Goals: Fair    Frequency Min 4X/week   Barriers to discharge Decreased caregiver support      Co-evaluation PT/OT/SLP Co-Evaluation/Treatment: Yes Reason for Co-Treatment: For patient/therapist safety;To address functional/ADL transfers;Complexity of the patient's impairments (multi-system involvement) PT goals addressed during session: Mobility/safety with mobility;Balance OT goals addressed during session: ADL's and self-care;Strengthening/ROM       AM-PAC PT "6 Clicks" Mobility  Outcome Measure Help needed turning from your back to your side while in a flat bed without using bedrails?: A Lot Help needed moving from lying on your back to sitting on the side of a flat bed without using bedrails?: A Lot Help needed moving to and from a bed to a chair (including a wheelchair)?: Total Help needed standing up from a chair using your arms (e.g., wheelchair or bedside chair)?: Total Help needed to walk in hospital room?: Total Help needed climbing 3-5 steps with a railing? : Total 6 Click Score: 8    End of Session Equipment Utilized During Treatment: Gait belt Activity Tolerance: Patient tolerated treatment well Patient left: in chair;with call bell/phone within reach;with chair alarm set Nurse Communication: Mobility status;Need for lift equipment PT Visit Diagnosis: Other abnormalities of gait and mobility (R26.89);Hemiplegia and hemiparesis;Other symptoms and signs involving the nervous system (R29.898) Hemiplegia - Right/Left: Left Hemiplegia - dominant/non-dominant: Non-dominant Hemiplegia - caused by: Other Nontraumatic intracranial hemorrhage    Time: 1610-9604 PT Time Calculation (min) (ACUTE ONLY): 28 min   Charges:   PT Evaluation $PT Eval Moderate Complexity: 1 Mod          Kenzel Ruesch Abner Greenspan, PT Acute Rehabilitation Services Pager: [phone] Office: [phone]  Talana Slatten B Natividad Schlosser 01/03/2019, 11:52 AM

## 2019-01-03 NOTE — Progress Notes (Signed)
OT Cancellation Note  Patient Details Name: Patrick Pollard MRN: [id number] DOB: 12/18/1945   Cancelled Treatment:    Reason Eval/Treat Not Completed: Active bedrest order; will follow.  Marcy Siren, OT Supplemental Rehabilitation Services Pager [phone] Office [phone]   Orlando Penner 01/03/2019, 8:16 AM

## 2019-01-03 NOTE — Progress Notes (Signed)
Pt transferred to 3w31. Assessment stable. Report called to [employer]

## 2019-01-03 NOTE — Progress Notes (Signed)
Inpatient Rehabilitation Admissions Coordinator  Inpatient Rehab Consult received. I met with patient at the bedside for rehabilitation assessment. We discussed goals and expectations of an inpatient rehab admission.  He requests I contact his wife, Karla, to discuss [facility] vs in[facility] rehab venues. I spoke with Karla by phone and discussed goals and expectations of an inpt rehab admission  . I will clarify coverage of [employer] Medicare costs and begin insurance authorization for a possible inpt rehab admission . I will follow up with patient tomorrow and his wife by phone to then discuss costs and they will decide preference for rehab venue.   , RN, MSN Rehab Admissions Coordinator [phone] 01/03/2019 3:37 PM    

## 2019-01-04 ENCOUNTER — Encounter (HOSPITAL_COMMUNITY): Payer: Self-pay

## 2019-01-04 ENCOUNTER — Other Ambulatory Visit: Payer: Self-pay

## 2019-01-04 ENCOUNTER — Inpatient Hospital Stay (HOSPITAL_COMMUNITY)
Admission: RE | Admit: 2019-01-04 | Discharge: 2019-02-07 | DRG: 057 | Disposition: A | Discharge status: Home Health | Payer: Medicare Other | Source: Intra-hospital | Attending: Physical Medicine & Rehabilitation | Admitting: Physical Medicine & Rehabilitation

## 2019-01-04 DIAGNOSIS — G8104 Flaccid hemiplegia affecting left nondominant side: Secondary | ICD-10-CM | POA: Diagnosis not present

## 2019-01-04 DIAGNOSIS — E871 Hypo-osmolality and hyponatremia: Secondary | ICD-10-CM | POA: Diagnosis not present

## 2019-01-04 DIAGNOSIS — R0989 Other specified symptoms and signs involving the circulatory and respiratory systems: Secondary | ICD-10-CM | POA: Diagnosis not present

## 2019-01-04 DIAGNOSIS — I612 Nontraumatic intracerebral hemorrhage in hemisphere, unspecified: Secondary | ICD-10-CM | POA: Diagnosis not present

## 2019-01-04 DIAGNOSIS — I161 Hypertensive emergency: Secondary | ICD-10-CM | POA: Diagnosis present

## 2019-01-04 DIAGNOSIS — I69154 Hemiplegia and hemiparesis following nontraumatic intracerebral hemorrhage affecting left non-dominant side: Principal | ICD-10-CM

## 2019-01-04 DIAGNOSIS — I69354 Hemiplegia and hemiparesis following cerebral infarction affecting left non-dominant side: Secondary | ICD-10-CM | POA: Diagnosis present

## 2019-01-04 DIAGNOSIS — F419 Anxiety disorder, unspecified: Secondary | ICD-10-CM | POA: Diagnosis present

## 2019-01-04 DIAGNOSIS — N179 Acute kidney failure, unspecified: Secondary | ICD-10-CM

## 2019-01-04 DIAGNOSIS — K5901 Slow transit constipation: Secondary | ICD-10-CM

## 2019-01-04 DIAGNOSIS — N400 Enlarged prostate without lower urinary tract symptoms: Secondary | ICD-10-CM

## 2019-01-04 DIAGNOSIS — G479 Sleep disorder, unspecified: Secondary | ICD-10-CM | POA: Diagnosis not present

## 2019-01-04 DIAGNOSIS — I69398 Other sequelae of cerebral infarction: Secondary | ICD-10-CM | POA: Diagnosis not present

## 2019-01-04 DIAGNOSIS — E785 Hyperlipidemia, unspecified: Secondary | ICD-10-CM | POA: Diagnosis present

## 2019-01-04 DIAGNOSIS — I69322 Dysarthria following cerebral infarction: Secondary | ICD-10-CM | POA: Diagnosis not present

## 2019-01-04 DIAGNOSIS — F332 Major depressive disorder, recurrent severe without psychotic features: Secondary | ICD-10-CM

## 2019-01-04 DIAGNOSIS — K6389 Other specified diseases of intestine: Secondary | ICD-10-CM

## 2019-01-04 DIAGNOSIS — N401 Enlarged prostate with lower urinary tract symptoms: Secondary | ICD-10-CM

## 2019-01-04 DIAGNOSIS — I69122 Dysarthria following nontraumatic intracerebral hemorrhage: Secondary | ICD-10-CM

## 2019-01-04 DIAGNOSIS — G4700 Insomnia, unspecified: Secondary | ICD-10-CM | POA: Diagnosis present

## 2019-01-04 DIAGNOSIS — Z881 Allergy status to other antibiotic agents status: Secondary | ICD-10-CM | POA: Diagnosis not present

## 2019-01-04 DIAGNOSIS — I610 Nontraumatic intracerebral hemorrhage in hemisphere, subcortical: Secondary | ICD-10-CM

## 2019-01-04 DIAGNOSIS — R110 Nausea: Secondary | ICD-10-CM

## 2019-01-04 DIAGNOSIS — Z87891 Personal history of nicotine dependence: Secondary | ICD-10-CM

## 2019-01-04 DIAGNOSIS — F329 Major depressive disorder, single episode, unspecified: Secondary | ICD-10-CM

## 2019-01-04 DIAGNOSIS — E079 Disorder of thyroid, unspecified: Secondary | ICD-10-CM

## 2019-01-04 DIAGNOSIS — G8194 Hemiplegia, unspecified affecting left nondominant side: Secondary | ICD-10-CM

## 2019-01-04 DIAGNOSIS — R209 Unspecified disturbances of skin sensation: Secondary | ICD-10-CM | POA: Diagnosis not present

## 2019-01-04 DIAGNOSIS — I69192 Facial weakness following nontraumatic intracerebral hemorrhage: Secondary | ICD-10-CM | POA: Diagnosis not present

## 2019-01-04 DIAGNOSIS — I619 Nontraumatic intracerebral hemorrhage, unspecified: Secondary | ICD-10-CM | POA: Diagnosis not present

## 2019-01-04 DIAGNOSIS — F331 Major depressive disorder, recurrent, moderate: Secondary | ICD-10-CM

## 2019-01-04 DIAGNOSIS — F330 Major depressive disorder, recurrent, mild: Secondary | ICD-10-CM

## 2019-01-04 DIAGNOSIS — I10 Essential (primary) hypertension: Secondary | ICD-10-CM

## 2019-01-04 DIAGNOSIS — R7303 Prediabetes: Secondary | ICD-10-CM | POA: Diagnosis present

## 2019-01-04 DIAGNOSIS — D72829 Elevated white blood cell count, unspecified: Secondary | ICD-10-CM

## 2019-01-04 DIAGNOSIS — I69054 Hemiplegia and hemiparesis following nontraumatic subarachnoid hemorrhage affecting left non-dominant side: Secondary | ICD-10-CM | POA: Diagnosis not present

## 2019-01-04 DIAGNOSIS — F321 Major depressive disorder, single episode, moderate: Secondary | ICD-10-CM

## 2019-01-04 HISTORY — DX: Benign prostatic hyperplasia with lower urinary tract symptoms: N40.1

## 2019-01-04 HISTORY — DX: Disorder of thyroid, unspecified: E07.9

## 2019-01-04 LAB — BASIC METABOLIC PANEL
Anion gap: 14 (ref 5–15)
BUN: 14 mg/dL (ref 8–23)
CO2: 23 mmol/L (ref 22–32)
Calcium: 9.5 mg/dL (ref 8.9–10.3)
Chloride: 95 mmol/L — ABNORMAL LOW (ref 98–111)
Creatinine, Ser: 0.76 mg/dL (ref 0.61–1.24)
GFR calc Af Amer: >60 mL/min (ref >60)
GFR calc non Af Amer: >60 mL/min (ref >60)
Glucose, Bld: 157 mg/dL — ABNORMAL HIGH (ref 70–99)
POTASSIUM: 3.5 mmol/L (ref 3.5–5.1)
Sodium: 132 mmol/L — ABNORMAL LOW (ref 135–145)

## 2019-01-04 LAB — CBC
HEMATOCRIT: 50 % (ref 39.0–52.0)
Hemoglobin: 16.6 g/dL (ref 13.0–17.0)
MCH: 29.3 pg (ref 26.0–34.0)
MCHC: 33.2 g/dL (ref 30.0–36.0)
MCV: 88.2 fL (ref 80.0–100.0)
Platelets: 301 10*3/uL (ref 150–400)
RBC: 5.67 MIL/uL (ref 4.22–5.81)
RDW: 13.1 % (ref 11.5–15.5)
WBC: 11.1 10*3/uL — ABNORMAL HIGH (ref 4.0–10.5)
nRBC: 0 % (ref 0.0–0.2)

## 2019-01-04 MED ORDER — SORBITOL 70 % SOLN
30.0000 mL | Freq: Every day | Status: DC | PRN
  Administered 2019-01-05 – 2019-02-01 (×5): 30 mL via ORAL
  Filled 2019-01-04 (×5): qty 30

## 2019-01-04 MED ORDER — SENNOSIDES-DOCUSATE SODIUM 8.6-50 MG PO TABS
1.0000 | ORAL_TABLET | Freq: Two times a day (BID) | ORAL | Status: DC
  Administered 2019-01-04 – 2019-01-07 (×6): 1 via ORAL
  Filled 2019-01-04 (×6): qty 1

## 2019-01-04 MED ORDER — ONDANSETRON 4 MG PO TBDP
4.0000 mg | ORAL_TABLET | Freq: Three times a day (TID) | ORAL | Status: DC | PRN
  Administered 2019-01-05: 4 mg via ORAL
  Filled 2019-01-04 (×2): qty 1

## 2019-01-04 MED ORDER — ARIPIPRAZOLE 5 MG PO TABS
5.0000 mg | ORAL_TABLET | Freq: Every day | ORAL | Status: DC
  Administered 2019-01-05 – 2019-01-30 (×26): 5 mg via ORAL
  Filled 2019-01-04 (×27): qty 1

## 2019-01-04 MED ORDER — VENLAFAXINE HCL ER 150 MG PO CP24
300.0000 mg | ORAL_CAPSULE | Freq: Every day | ORAL | Status: DC
  Administered 2019-01-05 – 2019-02-07 (×34): 300 mg via ORAL
  Filled 2019-01-04 (×35): qty 2

## 2019-01-04 MED ORDER — PANTOPRAZOLE SODIUM 40 MG PO TBEC
40.0000 mg | DELAYED_RELEASE_TABLET | Freq: Every day | ORAL | Status: DC
  Administered 2019-01-04 – 2019-02-06 (×34): 40 mg via ORAL
  Filled 2019-01-04 (×33): qty 1

## 2019-01-04 MED ORDER — ATORVASTATIN CALCIUM 10 MG PO TABS: 20.0000 mg | ORAL_TABLET | Freq: Every day | ORAL | Status: DC

## 2019-01-04 MED ORDER — TRIAMTERENE-HCTZ 37.5-25 MG PO TABS
1.0000 | ORAL_TABLET | Freq: Every day | ORAL | Status: DC
  Administered 2019-01-05 – 2019-01-11 (×7): 1 via ORAL
  Filled 2019-01-04 (×7): qty 1

## 2019-01-04 MED ORDER — SENNOSIDES-DOCUSATE SODIUM 8.6-50 MG PO TABS: 1.0000 | ORAL_TABLET | Freq: Two times a day (BID) | ORAL | Status: AC

## 2019-01-04 MED ORDER — ACETAMINOPHEN 325 MG PO TABS
650.0000 mg | ORAL_TABLET | ORAL | Status: DC | PRN
  Administered 2019-01-05 – 2019-02-04 (×38): 650 mg via ORAL
  Filled 2019-01-04 (×41): qty 2

## 2019-01-04 MED ORDER — LISINOPRIL 20 MG PO TABS
20.0000 mg | ORAL_TABLET | Freq: Every day | ORAL | Status: DC
  Administered 2019-01-05 – 2019-02-07 (×34): 20 mg via ORAL
  Filled 2019-01-04 (×34): qty 1

## 2019-01-04 MED ORDER — ACETAMINOPHEN 650 MG RE SUPP: 650.0000 mg | RECTAL | Status: DC | PRN

## 2019-01-04 MED ORDER — ACETAMINOPHEN 160 MG/5ML PO SOLN: 650.0000 mg | ORAL | Status: DC | PRN

## 2019-01-04 MED ORDER — TAMSULOSIN HCL 0.4 MG PO CAPS
0.4000 mg | ORAL_CAPSULE | Freq: Every day | ORAL | Status: DC
  Administered 2019-01-04 – 2019-02-06 (×34): 0.4 mg via ORAL
  Filled 2019-01-04 (×35): qty 1

## 2019-01-04 MED ORDER — LISINOPRIL 20 MG PO TABS: 20.0000 mg | ORAL_TABLET | Freq: Every day | ORAL | Status: DC

## 2019-01-04 NOTE — Progress Notes (Signed)
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 PMhx: GIB, anxiety/depression, BPH, left thyroid mass    PT Comments    Pt continues to demonstrate significant L sided impairment with L inattention and very poor trunk control. Pt required heavy two person physical assistance for bed mobility and transfers with use of STEDY. Pt would continue to benefit from skilled physical therapy services at this time while admitted and after d/c to address the below listed limitations in order to improve overall safety and independence with functional mobility.  Pt is very pleasant and eager to work with therapy services. He is highly motivated to regain mobility.    Follow Up Recommendations  CIR;Supervision/Assistance - 24 hour     Equipment Recommendations  Other (comment)(defer to next venue of care)    Recommendations for Other Services       Precautions / Restrictions Precautions Precautions: Fall Precaution Comments: L hemi, SbP <160 Restrictions Weight Bearing Restrictions: No    Mobility  Bed Mobility Overal bed mobility: Needs Assistance Bed Mobility: Supine to Sit     Supine to sit: Max assist;+2 for physical assistance;+2 for safety/equipment     General bed mobility comments: assist for LLE, heavy assist to elevate trunk with pt using RUE to push up on bedrail; strong L lateral lean and pushing with RUE once sitting upright requiring increased assist to steady  Transfers Overall transfer level: Needs assistance Equipment used: Ambulation equipment used Transfers: Sit to/from Stand Sit to Stand: Max assist;+2 physical assistance;+2 safety/equipment         General transfer comment: use of Corene Cornea for sit<>stand and transfer to recliner today; pt requiring heavy +2 assist  for sit<>stand with use of bed pad to assist with hips upright; pt with strong lateral lean seated on seat of Stedy - is able to correct with cues but continues to require assist to maintain sitting balance with transfer via stedy to recliner   Ambulation/Gait                 Stairs             Wheelchair Mobility    Modified Rankin (Stroke Patients Only) Modified Rankin (Stroke Patients Only) Pre-Morbid Rankin Score: No symptoms Modified Rankin: Severe disability     Balance Overall balance assessment: Needs assistance Sitting-balance support: Feet supported;Single extremity supported Sitting balance-Leahy Scale: Poor Sitting balance - Comments: pt with strong L lateral lean, pushing with R UE, able to minimally and briefly correct to midline with cueing Postural control: Left lateral lean Standing balance support: Single extremity supported Standing balance-Leahy Scale: Zero Standing balance comment: maxA+2 for standing balance                            Cognition Arousal/Alertness: Awake/alert Behavior During Therapy: WFL for tasks assessed/performed Overall Cognitive Status: Impaired/Different from baseline Area of Impairment: Problem solving;Awareness;Safety/judgement                         Safety/Judgement: Decreased awareness of deficits Awareness: Emergent Problem Solving: Slow processing;Decreased initiation;Requires verbal cues;Requires tactile cues General Comments: mild decrease with insight into current deficits as pt stating he can stand despite needing heavy two person assist to do so; slower processing noted but  overall WFL for basic tasks - will continue to assess higher level cognition       Exercises      General Comments General comments (skin integrity, edema, etc.): IV site bleeding at LUE, RN notified and present end of session; pt with nausea sitting EOB, reports decreased once positioned and semi-reclined in  chair; pt reports poor PO intake past couple of days - encouraged to eat to maintain strength/stamina      Pertinent Vitals/Pain Pain Assessment: No/denies pain    Home Living                      Prior Function            PT Goals (current goals can now be found in the care plan section) Acute Rehab PT Goals Patient Stated Goal: regain his independence PT Goal Formulation: With patient Time For Goal Achievement: 01/17/19 Potential to Achieve Goals: Fair Progress towards PT goals: Progressing toward goals    Frequency    Min 4X/week      PT Plan Current plan remains appropriate    Co-evaluation PT/OT/SLP Co-Evaluation/Treatment: Yes Reason for Co-Treatment: Complexity of the patient's impairments (multi-system involvement);For patient/therapist safety;To address functional/ADL transfers PT goals addressed during session: Mobility/safety with mobility;Proper use of DME;Balance;Strengthening/ROM OT goals addressed during session: Strengthening/ROM;ADL's and self-care      AM-PAC PT "6 Clicks" Mobility   Outcome Measure  Help needed turning from your back to your side while in a flat bed without using bedrails?: Total Help needed moving from lying on your back to sitting on the side of a flat bed without using bedrails?: Total Help needed moving to and from a bed to a chair (including a wheelchair)?: Total Help needed standing up from a chair using your arms (e.g., wheelchair or bedside chair)?: Total Help needed to walk in hospital room?: Total Help needed climbing 3-5 steps with a railing? : Total 6 Click Score: 6    End of Session Equipment Utilized During Treatment: Gait belt Activity Tolerance: Patient tolerated treatment well Patient left: in chair;with call bell/phone within reach;with chair alarm set Nurse Communication: Mobility status;Need for lift equipment PT Visit Diagnosis: Other abnormalities of gait and mobility (R26.89);Hemiplegia and  hemiparesis;Other symptoms and signs involving the nervous system (R29.898) Hemiplegia - Right/Left: Left Hemiplegia - dominant/non-dominant: Non-dominant Hemiplegia - caused by: Other Nontraumatic intracranial hemorrhage     Time: 0822-0859 PT Time Calculation (min) (ACUTE ONLY): 37 min  Charges:  $Therapeutic Activity: 8-22 mins                     Patrick Pollard, [place], DPT  Acute Rehabilitation Services Pager [phone] Office [phone]     Patrick Pollard 01/04/2019, 10:54 AM

## 2019-01-04 NOTE — Care Management Important Message (Signed)
Important Message  Patient Details  Name: Patrick Pollard MRN: [id number] Date of Birth: [birth year]   Medicare Important Message Given:  Yes    Dorena Bodo 01/04/2019, 4:48 PM

## 2019-01-04 NOTE — TOC Transition Note (Signed)
Transition of Care [facility]) - CM/SW Discharge Note   Patient Details  Name: Patrick Pollard MRN: [id number] Date of Birth: 09/25/1946  [facility] of [facility]) CM/SW Contact:  Kermit Balo, RN Phone Number: 01/04/2019, 1:56 PM   Clinical Narrative:    Pt discharging to CIR.   Final next level of care: IP Rehab Facility Barriers to Discharge: No Barriers Identified   Patient Goals and CMS Choice        Discharge Placement                       Discharge Plan and Services                          Social Determinants of Health (SDOH) Interventions     Readmission Risk Interventions No flowsheet data found.

## 2019-01-04 NOTE — PMR Pre-admission (Signed)
PMR Admission Coordinator Pre-Admission Assessment  Patient: Patrick Pollard is an 73 y.o., male MRN: [id number] DOB: 07/31/1946 Height: 6' 1"  (185.4 cm) Weight: 103 kg  Insurance Information HMO:    PPO:  yes    PCP:      IPA:      80/20:      OTHER: medicare advantage plan PRIMARY: [place] [employer]      Policy#: [id number]      Subscriber: pt CM Name: Freida Busman      Phone#: [phone]     Fax#: [phone] Pre-Cert#: [id number] approved for 7 days      Employer:  Benefits:  Phone #: [phone]     Name: 01/03/2019 Eff. Date: 10/10/2018     Deduct: none      Out of Pocket Max: $4000      Life Max: none CIR: $160 co pay per day days 1 until 10      SNF: no co pay days 1 until 20; $50 co pay per day days 21 until 100 Outpatient: $20 per visit     Co-Pay: visits per medical neccesity Home Health: 100%      Co-Pay: visits per medical neccesity DME: 80%     Co-Pay: 20% Providers: in network  SECONDARY: [facility]        Medicaid Application Date:       Case Manager:  Disability Application Date:       Case Worker:    Emergency Facilities manager Information    Name Relation Home Work [place] Other   [place], [place] Spouse   [phone]      Current Medical History  Patient Admitting Diagnosis: CVA  History of Present Illness: Patrick Pollard is a 73 year old right-handed male with past history of GI bleed due to polyp status post resection 2018, hypertension maintained on Maxide, depression continues with Abilify, Effexor as well as Wellbutrin. Presented 01/01/2019 with acute onset of left-sided weakness, mild dysarthria and facial droop while up to the bathroom. Blood pressure 374 systolic upon arrival to the ED. Denied any chest pain or shortness of breath. Cranial CT scan showed a 3.1 x 2.6 x 2.3 cm acute hemorrhage in the right lateral thalamus/posterior limb internal capsule mild surrounding edema. CT angiogram of head and neck negative  for vascular malformation. 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 Echocardiogram with ejection fraction of 60%. Systolic function normal. Placed on Cardene for blood pressure control. Tolerating a regular diet.  Complete NIHSS TOTAL: 11  Patient's medical record from [facility]  has been reviewed by the rehabilitation admission coordinator and physician.  Past Medical History  Past Medical History:  Diagnosis Date  . Anxiety   . Colon polyps    s/p colonoscopy 12/2016 wtih polyp removal   . Depression     Family History   family history is not on file.  Prior Rehab/Hospitalizations Has the patient had prior rehab or hospitalizations prior to admission? No  Has the patient had major surgery during 100 days prior to admission? No   Current Medications  Current Facility-Administered Medications:  .  acetaminophen (TYLENOL) tablet 650 mg, 650 mg, Oral, Q4H PRN, 650 mg at 01/04/19 0400 **OR** acetaminophen (TYLENOL) solution 650 mg, 650 mg, Per Tube, Q4H PRN **OR** acetaminophen (TYLENOL) suppository 650 mg, 650 mg, Rectal, Q4H PRN, Aroor, Karena Addison R, MD .  ARIPiprazole (ABILIFY) tablet 5 mg, 5 mg, Oral, Daily, Rosalin Hawking, MD, 5 mg at  01/04/19 0905 .  atorvastatin (LIPITOR) tablet 20 mg, 20 mg, Oral, q1800, Rosalin Hawking, MD .  labetalol (NORMODYNE,TRANDATE) injection 10-20 mg, 10-20 mg, Intravenous, Q10 min PRN, Rosalin Hawking, MD .  lisinopril (PRINIVIL,ZESTRIL) tablet 20 mg, 20 mg, Oral, Daily, Rosalin Hawking, MD, 20 mg at 01/04/19 0905 .  nicardipine (CARDENE) 82m in 0.86% saline 2032mIV infusion (0.1 mg/ml), 3-15 mg/hr, Intravenous, Continuous, XuRosalin HawkingMD, Stopped at 01/03/19 1020 .  ondansetron (ZOFRAN) injection 4 mg, 4 mg, Intravenous, Q6H PRN, XuRosalin HawkingMD, 4 mg at 01/04/19 0401 .  pantoprazole (PROTONIX) EC tablet 40 mg, 40 mg, Oral, QHS, XuRosalin HawkingMD, 40 mg at 01/03/19 2141 .  senna-docusate (Senokot-S) tablet 1 tablet, 1 tablet, Oral, BID, Aroor,  SuLanice SchwabMD, 1 tablet at 01/04/19 0905 .  tamsulosin (FLOMAX) capsule 0.4 mg, 0.4 mg, Oral, QHS, XuRosalin HawkingMD, 0.4 mg at 01/03/19 2141 .  triamterene-hydrochlorothiazide (MAXZIDE-25) 37.5-25 MG per tablet 1 tablet, 1 tablet, Oral, Daily, XuRosalin HawkingMD, 1 tablet at 01/04/19 0905 .  venlafaxine XR (EFFEXOR-XR) 24 hr capsule 300 mg, 300 mg, Oral, Q breakfast, XuRosalin HawkingMD, 300 mg at 01/04/19 [id number] Current Diet:  Diet Order            Diet regular Room service appropriate? Yes with Assist; Fluid consistency: Thin  Diet effective now              Precautions / Restrictions Precautions Precautions: Fall Precaution Comments: L hemi, SbP <160 Restrictions Weight Bearing Restrictions: No   Has the patient had 2 or more falls or a fall with injury in the past year? No  Prior Activity Level Community (5-7x/wk): independent, active, drviing, gardening  Prior Functional Level Self Care: Did the patient need help bathing, dressing, using the toilet or eating? Independent  Indoor Mobility: Did the patient need assistance with walking from room to room (with or without device)? Independent  Stairs: Did the patient need assistance with internal or external stairs (with or without device)? Independent  Functional Cognition: Did the patient need help planning regular tasks such as shopping or remembering to take medications? Independent  Home Assistive Devices / Equipment Home Assistive Devices/Equipment: None Home Equipment: Walker - 2 wheels, Grab bars - tub/shower  Prior Device Use: Indicate devices/aids used by the patient prior to current illness, exacerbation or injury? None of the above  Current Functional Level Cognition  Arousal/Alertness: Awake/alert Overall Cognitive Status: Impaired/Different from baseline Orientation Level: Oriented X4 Safety/Judgement: Decreased awareness of deficits General Comments: mild decrease with insight into current deficits as  pt stating he can stand despite needing heavy two person assist to do so; slower processing noted but overall WFL for basic tasks - will continue to assess higher level cognition  Attention: Focused, Sustained Focused Attention: Appears intact(Vigilance WNL: 1/1) Sustained Attention: Appears intact(Serial 7s: 2/3) Memory: Appears intact Awareness: Appears intact Problem Solving: Appears intact(4/4) Executive Function: Reasoning, Sequencing Reasoning: Impaired Reasoning Impairment: Verbal complex(Abstraction: 1/2) Sequencing: Impaired Sequencing Impairment: Verbal complex(Clock drawing: 2/3) Safety/Judgment: Appears intact    Extremity Assessment (includes Sensation/Coordination)  Upper Extremity Assessment: Defer to OT evaluation LUE Deficits / Details: trace activation noted with elbow flexion/extension (grossly 1/5) LUE Sensation: decreased light touch LUE Coordination: decreased fine motor, decreased gross motor  Lower Extremity Assessment: LLE deficits/detail LLE Deficits / Details: no noted sensation or movement throughout session with dense hemiplegia    ADLs  Overall ADL's : Needs assistance/impaired Eating/Feeding: Set up, Sitting Eating/Feeding Details (indicate cue  type and reason): PT sent message to SLP for swallowing follow up as RN reports pt with pill resting on L side of mouth and pt unaware earlier today (due to decreased sensation) Grooming: Set up, Min guard, Sitting, Wash/dry face, Maximal assistance Grooming Details (indicate cue type and reason): setup for actual task performance; maxA for sitting balance EOB  Upper Body Bathing: Moderate assistance, Sitting Lower Body Bathing: Maximal assistance, +2 for physical assistance, +2 for safety/equipment, Sit to/from stand Upper Body Dressing : Moderate assistance, Maximal assistance, Sitting Lower Body Dressing: Maximal assistance, Total assistance, +2 for physical assistance, +2 for safety/equipment, Sit to/from  stand Lower Body Dressing Details (indicate cue type and reason): totalA to don socks today Toilet Transfer: Total assistance, +2 for physical assistance, +2 for safety/equipment Toilet Transfer Details (indicate cue type and reason): simulated via transfer to recliner, use of maxisky for totalA transfer Toileting- Clothing Manipulation and Hygiene: Total assistance, +2 for physical assistance, +2 for safety/equipment Functional mobility during ADLs: Maximal assistance, +2 for physical assistance, +2 for safety/equipment General ADL Comments: pt continues to have strong lateral lean when sitting/standing; educated pt on importance of attending to L side due to decreased sensation     Mobility  Overal bed mobility: Needs Assistance Bed Mobility: Supine to Sit Rolling: Mod assist, +2 for safety/equipment Sidelying to sit: +2 for safety/equipment, +2 for physical assistance, Mod assist Supine to sit: Max assist, +2 for physical assistance, +2 for safety/equipment General bed mobility comments: assist for LLE, heavy assist to elevate trunk with pt using RUE to push up on bedrail; strong L lateral lean and pushing with RUE once sitting upright requiring increased assist to steady    Transfers  Overall transfer level: Needs assistance Equipment used: Ambulation equipment used Transfer via Lift Equipment: Stedy Transfers: Sit to/from [employer] to Stand: Max assist, +2 physical assistance, +2 safety/equipment General transfer comment: use of Denna Haggard for sit<>stand and transfer to recliner today; pt requiring heavy +2 assist for sit<>stand with use of bed pad to assist with hips upright; pt with strong lateral lean seated on seat of Stedy - is able to correct with cues but continues to require assist to maintain sitting balance with transfer via stedy to recliner     Ambulation / Gait / Stairs / Emergency planning/management officer  Ambulation/Gait General Gait Details: unable    Posture / Balance Dynamic  Sitting Balance Sitting balance - Comments: pt with strong L lateral lean, pushing with R UE, able to minimally and briefly correct to midline with cueing Balance Overall balance assessment: Needs assistance Sitting-balance support: Feet supported, Single extremity supported Sitting balance-Leahy Scale: Poor Sitting balance - Comments: pt with strong L lateral lean, pushing with R UE, able to minimally and briefly correct to midline with cueing Postural control: Left lateral lean Standing balance support: Single extremity supported Standing balance-Leahy Scale: Zero Standing balance comment: maxA+2 for standing balance    Special needs/care consideration BiPAP/CPAP  N/a CPM  N/a Continuous Drip IV  N/a Dialysis n/a Life Vest  N/a Oxygen  N/a Special Bed  N/a Trach Size  N/a Wound Vac n/a Skin intact Bowel mgmt:  Continent LBM 3/24 Bladder mgmt: external catheter Diabetic mgmt: n/a Behavioral consideration  N/a Chemo/radiation  N/a Having back and buttocks pain    Previous Home Environment  Living Arrangements: Engineer, water , Water quality scientist)  Lives With: Spouse, Daughter Available Help at Discharge: Family, Available 24 hours/day Type of Home: House Home Layout: One level Home Access:  Ramped entrance Bathroom Shower/Tub: Multimedia programmer: Handicapped height Bathroom Accessibility: Yes How Accessible: Accessible via walker [place]: No  Discharge Living Setting Plans for Discharge Living Setting: Patient's home, Lives with (comment)(wife and daughter, heather) Type of Home at Discharge: House Discharge Home Layout: One level Discharge Home Access: [place] entrance Discharge Bathroom Shower/Tub: Walk-in shower Discharge Bathroom Toilet: Handicapped height Discharge Bathroom Accessibility: Yes How Accessible: Accessible via walker Does the patient have any problems obtaining your medications?: No  Social/Family/Support  Systems Patient Roles: Spouse, Parent(retired Kathlen [place]) Contact Information: wife, Florentina Jenny Anticipated Caregiver: wife, son and daughter Anticipated Caregiver's Contact Information: wife, Florentina Jenny [phone] Ability/Limitations of Caregiver: no limitations Caregiver Availability: 24/7 Discharge Plan Discussed with Primary Caregiver: Yes Is Caregiver In Agreement with Plan?: Yes Does Caregiver/Family have Issues with Lodging/Transportation while Pt is in Rehab?: No  Goals/Additional Needs Patient/Family Goal for Rehab: min assist PT and OT, supervision SLP Expected length of stay: ELOS 14 to 20 days Special Service Needs: patietn having lots of back and buttocks pain making him restless Additional Information: Paitetn uses the herb m Fennel, at home for his nausea Pt/Family Agrees to Admission and willing to participate: Yes Program Orientation Provided & Reviewed with Pt/Caregiver Including Roles  & Responsibilities: Yes  Decrease burden of Care through IP rehab admission: n/a  Possible need for SNF placement upon discharge:  N/a  Patient Condition: I have reviewed medical records from [facility], spoken with patient, and spouse. I met with patient at the bedside  for inpatient rehabilitation assessment.  Patient will benefit from ongoing PT, OT, and SLP, can actively participate in 3 hours of therapy a day 5 days of the week, and can make measurable gains during the admission.  Patient will also benefit from the coordinated team approach during an Inpatient Acute Rehabilitation admission.  The patient will receive intensive therapy as well as Rehabilitation physician, nursing, social worker, and care management interventions.  Due to bowel management, bladder management, safety, skin/wound care, disease management, medical administration, pain management, patient education the patient requires 24 hour a day rehabilitation nursing.  The patient is currently max assist with mobility  and basic ADLs.  Discharge setting and therapy post discharge at , home with home health is anticipated.  Patient has agreed to participate in the Acute Inpatient Rehabilitation Program and will admit today.  Preadmission Screen Completed By:  Cleatrice Burke RN MSN, 01/04/2019 1:09 PM ______________________________________________________________________   Discussed status with Dr. Naaman Plummer  on  01/04/2019 at  1322 and received approval for admission today.  Admission Coordinator:  Cleatrice Burke, RN MSN, time  1963 Date  01/04/2019   Assessment/Plan: Diagnosis: right thalamus/PLIC hemorrhage 1. Does the need for close, 24 hr/day Medical supervision in concert with the patient's rehab needs make it unreasonable for this patient to be served in a less intensive setting? Yes 2. Co-Morbidities requiring supervision/potential complications: post hemorrhage neurological sequelae, depression 3. Due to bladder management, bowel management, safety, skin/wound care, disease management, medication administration, pain management and patient education, does the patient require 24 hr/day rehab nursing? Yes 4. Does the patient require coordinated care of a physician, rehab nurse, PT (1-2 hrs/day, 5 days/week), OT (1-2 hrs/day, 5 days/week) and SLP (1-2 hrs/day, 5 days/week) to address physical and functional deficits in the context of the above medical diagnosis(es)? Yes Addressing deficits in the following areas: balance, endurance, locomotion, strength, transferring, bowel/bladder control, bathing, dressing, feeding, grooming, toileting, cognition, speech, language and psychosocial support 5.  Can the patient actively participate in an intensive therapy program of at least 3 hrs of therapy 5 days a week? Yes 6. The potential for patient to make measurable gains while on inpatient rehab is good 7. Anticipated functional outcomes upon discharge from inpatients are: min assist PT, min assist OT,  supervision SLP 8. Estimated rehab length of stay to reach the above functional goals is: 14-20 days 9. Anticipated D/C setting: Home 10. Anticipated post D/C treatments: [place] therapy 11. Overall Rehab/Functional Prognosis: excellent  MD Signature: Meredith Staggers, MD, [facility] 01/04/2019

## 2019-01-04 NOTE — Progress Notes (Signed)
Meredith Staggers, MD  Physician  Physical Medicine and Rehabilitation  PMR Pre-admission  Signed  Date of Service:  01/04/2019 1:09 PM       Related encounter: ED to Hosp-Admission (Discharged) from 01/01/2019 in [facility]      Signed         Show:Clear all '[x]'$ Manual'[x]'$ Template'[x]'$ Copied  Added by: '[x]'$ Julious Payer Vertis Kelch, RN'[x]'$ Meredith Staggers, MD  '[]'$ Hover for details PMR Admission Coordinator Pre-Admission Assessment  Patient: Patrick Pollard is an 73 y.o., male MRN: [id number] DOB: [birth year] Height: '6\' 1"'$  (185.4 cm) Weight: 103 kg  Insurance Information HMO:    PPO:  yes    PCP:      IPA:      80/20:      OTHER: medicare advantage plan PRIMARY: [place] [employer]      Policy#: [id number]      Subscriber: pt CM Name: Freida Busman      Phone#: [phone]     Fax#: [phone] Pre-Cert#: [id number] approved for 7 days      Employer:  Benefits:  Phone #: [phone]     Name: 01/03/2019 Eff. Date: 10/10/2018     Deduct: none      Out of Pocket Max: $4000      Life Max: none CIR: $160 co pay per day days 1 until 10      SNF: no co pay days 1 until 20; $50 co pay per day days 21 until 100 Outpatient: $20 per visit     Co-Pay: visits per medical neccesity Home Health: 100%      Co-Pay: visits per medical neccesity DME: 80%     Co-Pay: 20% Providers: in network  SECONDARY: [facility]        Medicaid Application Date:       Case Manager:  Disability Application Date:       Case Worker:    Emergency Publishing copy Information    Name Relation Home Work [place] Other   [place], [place] Spouse   [phone]      Current Medical History  Patient Admitting Diagnosis: CVA  History of Present Illness: Patrick Pollard is a 73 year old right-handed male with past history of GI bleed due to polyp status post resection 2018, hypertension maintained on Maxide, depression continues with  Abilify, Effexor as well as Wellbutrin. Presented 01/01/2019 with acute onset of left-sided weakness, mild dysarthria and facial droop while up to the bathroom. Blood pressure 597 systolic upon arrival to the ED. Denied any chest pain or shortness of breath. Cranial CT scan showed a 3.1 x 2.6 x 2.3 cm acute hemorrhage in the right lateral thalamus/posterior limb internal capsule mild surrounding edema. CT angiogram of head and neck negative for vascular malformation. 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 Echocardiogram with ejection fraction of 60%. Systolic function normal. Placed on Cardene for blood pressure control. Tolerating a regular diet.  Complete NIHSS TOTAL: 11  Patient[facility]  has been reviewed by the rehabilitation admission coordinator and physician.  Past Medical History      Past Medical History:  Diagnosis Date  . Anxiety   . Colon polyps    s/p colonoscopy 12/2016 wtih polyp removal   . Depression     Family History   family history is not on file.  Prior Rehab/Hospitalizations Has the patient had prior rehab or hospitalizations prior  to admission? No  Has the patient had major surgery during 100 days prior to admission? No              Current Medications  Current Facility-Administered Medications:  .  acetaminophen (TYLENOL) tablet 650 mg, 650 mg, Oral, Q4H PRN, 650 mg at 01/04/19 0400 **OR** acetaminophen (TYLENOL) solution 650 mg, 650 mg, Per Tube, Q4H PRN **OR** acetaminophen (TYLENOL) suppository 650 mg, 650 mg, Rectal, Q4H PRN, Aroor, Karena Addison R, MD .  ARIPiprazole (ABILIFY) tablet 5 mg, 5 mg, Oral, Daily, Rosalin Hawking, MD, 5 mg at 01/04/19 0905 .  atorvastatin (LIPITOR) tablet 20 mg, 20 mg, Oral, q1800, Rosalin Hawking, MD .  labetalol (NORMODYNE,TRANDATE) injection 10-20 mg, 10-20 mg, Intravenous, Q10 min PRN, Rosalin Hawking, MD .  lisinopril (PRINIVIL,ZESTRIL) tablet 20 mg, 20 mg, Oral, Daily, Rosalin Hawking, MD,  20 mg at 01/04/19 0905 .  nicardipine (CARDENE) '20mg'$  in 0.86% saline 225m IV infusion (0.1 mg/ml), 3-15 mg/hr, Intravenous, Continuous, XRosalin Hawking MD, Stopped at 01/03/19 1020 .  ondansetron (ZOFRAN) injection 4 mg, 4 mg, Intravenous, Q6H PRN, XRosalin Hawking MD, 4 mg at 01/04/19 0401 .  pantoprazole (PROTONIX) EC tablet 40 mg, 40 mg, Oral, QHS, XRosalin Hawking MD, 40 mg at 01/03/19 2141 .  senna-docusate (Senokot-S) tablet 1 tablet, 1 tablet, Oral, BID, Aroor, SLanice Schwab MD, 1 tablet at 01/04/19 0905 .  tamsulosin (FLOMAX) capsule 0.4 mg, 0.4 mg, Oral, QHS, XRosalin Hawking MD, 0.4 mg at 01/03/19 2141 .  triamterene-hydrochlorothiazide (MAXZIDE-25) 37.5-25 MG per tablet 1 tablet, 1 tablet, Oral, Daily, XRosalin Hawking MD, 1 tablet at 01/04/19 0905 .  venlafaxine XR (EFFEXOR-XR) 24 hr capsule 300 mg, 300 mg, Oral, Q breakfast, XRosalin Hawking MD, 300 mg at 01/04/19 08182 Patients Current Diet:     Diet Order                  Diet regular Room service appropriate? Yes with Assist; Fluid consistency: Thin  Diet effective now               Precautions / Restrictions Precautions Precautions: Fall Precaution Comments: L hemi, SbP <160 Restrictions Weight Bearing Restrictions: No   Has the patient had 2 or more falls or a fall with injury in the past year? No  Prior Activity Level Community (5-7x/wk): independent, active, drviing, gardening  Prior Functional Level Self Care: Did the patient need help bathing, dressing, using the toilet or eating? Independent  Indoor Mobility: Did the patient need assistance with walking from room to room (with or without device)? Independent  Stairs: Did the patient need assistance with internal or external stairs (with or without device)? Independent  Functional Cognition: Did the patient need help planning regular tasks such as shopping or remembering to take medications? Independent  Home Assistive Devices / Equipment Home Assistive  Devices/Equipment: None Home Equipment: Walker - 2 wheels, Grab bars - tub/shower  Prior Device Use: Indicate devices/aids used by the patient prior to current illness, exacerbation or injury? None of the above  Current Functional Level Cognition  Arousal/Alertness: Awake/alert Overall Cognitive Status: Impaired/Different from baseline Orientation Level: Oriented X4 Safety/Judgement: Decreased awareness of deficits General Comments: mild decrease with insight into current deficits as pt stating he can stand despite needing heavy two person assist to do so; slower processing noted but overall WFL for basic tasks - will continue to assess higher level cognition  Attention: Focused, Sustained Focused Attention: Appears intact(Vigilance WNL: 1/1) Sustained Attention: Appears intact(Serial 7s: 2/3) Memory: Appears  intact Awareness: Appears intact Problem Solving: Appears intact(4/4) Executive Function: Reasoning, Sequencing Reasoning: Impaired Reasoning Impairment: Verbal complex(Abstraction: 1/2) Sequencing: Impaired Sequencing Impairment: Verbal complex(Clock drawing: 2/3) Safety/Judgment: Appears intact    Extremity Assessment (includes Sensation/Coordination)  Upper Extremity Assessment: Defer to OT evaluation LUE Deficits / Details: trace activation noted with elbow flexion/extension (grossly 1/5) LUE Sensation: decreased light touch LUE Coordination: decreased fine motor, decreased gross motor  Lower Extremity Assessment: LLE deficits/detail LLE Deficits / Details: no noted sensation or movement throughout session with dense hemiplegia    ADLs  Overall ADL's : Needs assistance/impaired Eating/Feeding: Set up, Sitting Eating/Feeding Details (indicate cue type and reason): PT sent message to SLP for swallowing follow up as RN reports pt with pill resting on L side of mouth and pt unaware earlier today (due to decreased sensation) Grooming: Set up, Min guard, Sitting,  Wash/dry face, Maximal assistance Grooming Details (indicate cue type and reason): setup for actual task performance; maxA for sitting balance EOB  Upper Body Bathing: Moderate assistance, Sitting Lower Body Bathing: Maximal assistance, +2 for physical assistance, +2 for safety/equipment, Sit to/from stand Upper Body Dressing : Moderate assistance, Maximal assistance, Sitting Lower Body Dressing: Maximal assistance, Total assistance, +2 for physical assistance, +2 for safety/equipment, Sit to/from stand Lower Body Dressing Details (indicate cue type and reason): totalA to don socks today Toilet Transfer: Total assistance, +2 for physical assistance, +2 for safety/equipment Toilet Transfer Details (indicate cue type and reason): simulated via transfer to recliner, use of maxisky for totalA transfer Toileting- Clothing Manipulation and Hygiene: Total assistance, +2 for physical assistance, +2 for safety/equipment Functional mobility during ADLs: Maximal assistance, +2 for physical assistance, +2 for safety/equipment General ADL Comments: pt continues to have strong lateral lean when sitting/standing; educated pt on importance of attending to L side due to decreased sensation     Mobility  Overal bed mobility: Needs Assistance Bed Mobility: Supine to Sit Rolling: Mod assist, +2 for safety/equipment Sidelying to sit: +2 for safety/equipment, +2 for physical assistance, Mod assist Supine to sit: Max assist, +2 for physical assistance, +2 for safety/equipment General bed mobility comments: assist for LLE, heavy assist to elevate trunk with pt using RUE to push up on bedrail; strong L lateral lean and pushing with RUE once sitting upright requiring increased assist to steady    Transfers  Overall transfer level: Needs assistance Equipment used: Ambulation equipment used Transfer via Lift Equipment: Stedy Transfers: Sit to/from [employer] to Stand: Max assist, +2 physical assistance, +2  safety/equipment General transfer comment: use of Denna Haggard for sit<>stand and transfer to recliner today; pt requiring heavy +2 assist for sit<>stand with use of bed pad to assist with hips upright; pt with strong lateral lean seated on seat of Stedy - is able to correct with cues but continues to require assist to maintain sitting balance with transfer via stedy to recliner     Ambulation / Gait / Stairs / Emergency planning/management officer  Ambulation/Gait General Gait Details: unable    Posture / Balance Dynamic Sitting Balance Sitting balance - Comments: pt with strong L lateral lean, pushing with R UE, able to minimally and briefly correct to midline with cueing Balance Overall balance assessment: Needs assistance Sitting-balance support: Feet supported, Single extremity supported Sitting balance-Leahy Scale: Poor Sitting balance - Comments: pt with strong L lateral lean, pushing with R UE, able to minimally and briefly correct to midline with cueing Postural control: Left lateral lean Standing balance support: Single extremity supported Standing balance-Leahy Scale:  Zero Standing balance comment: maxA+2 for standing balance    Special needs/care consideration BiPAP/CPAP  N/a CPM  N/a Continuous Drip IV  N/a Dialysis n/a Life Vest  N/a Oxygen  N/a Special Bed  N/a Trach Size  N/a Wound Vac n/a Skin intact Bowel mgmt:  Continent LBM 3/24 Bladder mgmt: external catheter Diabetic mgmt: n/a Behavioral consideration  N/a Chemo/radiation  N/a Having back and buttocks pain    Previous Home Environment  Living Arrangements: Engineer, water , Water quality scientist)  Lives With: Spouse, Daughter Available Help at Discharge: Family, Available 24 hours/day Type of Home: House Home Layout: One level Home Access: Ramped entrance Bathroom Shower/Tub: Multimedia programmer: Handicapped height Bathroom Accessibility: Yes How Accessible: Accessible via walker [place]: No  Discharge Living Setting Plans for Discharge Living Setting: Patient's home, Lives with (comment)(wife and daughter, heather) Type of Home at Discharge: House Discharge Home Layout: One level Discharge Home Access: Jennette entrance Discharge Bathroom Shower/Tub: Walk-in shower Discharge Bathroom Toilet: Handicapped height Discharge Bathroom Accessibility: Yes How Accessible: Accessible via walker Does the patient have any problems obtaining your medications?: No  Social/Family/Support Systems Patient Roles: Spouse, Parent(retired Kathlen [place]) Contact Information: wife, Florentina Jenny Anticipated Caregiver: wife, son and daughter Anticipated Caregiver's Contact Information: wife, Florentina Jenny [phone] Ability/Limitations of Caregiver: no limitations Caregiver Availability: 24/7 Discharge Plan Discussed with Primary Caregiver: Yes Is Caregiver In Agreement with Plan?: Yes Does Caregiver/Family have Issues with Lodging/Transportation while Pt is in Rehab?: No  Goals/Additional Needs Patient/Family Goal for Rehab: min assist PT and OT, supervision SLP Expected length of stay: ELOS 14 to 20 days Special Service Needs: patietn having lots of back and buttocks pain making him restless Additional Information: Paitetn uses the herb m Fennel, at home for his nausea Pt/Family Agrees to Admission and willing to participate: Yes Program Orientation Provided & Reviewed with Pt/Caregiver Including Roles  & Responsibilities: Yes  Decrease burden of Care through IP rehab admission: n/a  Possible need for SNF placement upon discharge:  N/a  Patient Condition: I have reviewed medical records from [facility], spoken with patient, and spouse. I met with patient at the bedside  for inpatient rehabilitation assessment.  Patient will benefit from ongoing PT, OT, and SLP, can actively participate in 3 hours of therapy a day 5 days of the week, and can make measurable gains during the  admission.  Patient will also benefit from the coordinated team approach during an Inpatient Acute Rehabilitation admission.  The patient will receive intensive therapy as well as Rehabilitation physician, nursing, social worker, and care management interventions.  Due to bowel management, bladder management, safety, skin/wound care, disease management, medical administration, pain management, patient education the patient requires 24 hour a day rehabilitation nursing.  The patient is currently max assist with mobility and basic ADLs.  Discharge setting and therapy post discharge at , home with home health is anticipated.  Patient has agreed to participate in the Acute Inpatient Rehabilitation Program and will admit today.  Preadmission Screen Completed By:  Cleatrice Burke RN MSN, 01/04/2019 1:09 PM ______________________________________________________________________   Discussed status with Dr. Naaman Plummer  on  01/04/2019 at  1322 and received approval for admission today.  Admission Coordinator:  Cleatrice Burke, RN MSN, time  [id number] Date  01/04/2019   Assessment/Plan: Diagnosis: right thalamus/PLIC hemorrhage 1. Does the need for close, 24 hr/day Medical supervision in concert with the patient's rehab needs make it unreasonable for this patient to be served in a less intensive  setting? Yes 2. Co-Morbidities requiring supervision/potential complications: post hemorrhage neurological sequelae, depression 3. Due to bladder management, bowel management, safety, skin/wound care, disease management, medication administration, pain management and patient education, does the patient require 24 hr/day rehab nursing? Yes 4. Does the patient require coordinated care of a physician, rehab nurse, PT (1-2 hrs/day, 5 days/week), OT (1-2 hrs/day, 5 days/week) and SLP (1-2 hrs/day, 5 days/week) to address physical and functional deficits in the context of the above medical diagnosis(es)? Yes Addressing  deficits in the following areas: balance, endurance, locomotion, strength, transferring, bowel/bladder control, bathing, dressing, feeding, grooming, toileting, cognition, speech, language and psychosocial support 5. Can the patient actively participate in an intensive therapy program of at least 3 hrs of therapy 5 days a week? Yes 6. The potential for patient to make measurable gains while on inpatient rehab is good 7. Anticipated functional outcomes upon discharge from inpatients are: min assist PT, min assist OT, supervision SLP 8. Estimated rehab length of stay to reach the above functional goals is: 14-20 days 9. Anticipated D/C setting: Home 10. Anticipated post D/C treatments: [place] therapy 11. Overall Rehab/Functional Prognosis: excellent  MD Signature: Meredith Staggers, MD, [facility] 01/04/2019         Revision History

## 2019-01-04 NOTE — H&P (Signed)
Physical Medicine and Rehabilitation Admission H&P        Chief Complaint  Patient presents with   Code Stroke    Chief complaint: left side weakness   HPI: Patrick Pollard is a 73 year old right-handed male with past history of GI bleed due to polyp status post resection 2018, hypertension maintained on Maxide, depression continues with Abilify, Effexor as well as Wellbutrin.per chart review patient lives with spouse. One level home with ramped entrance. Independent prior to admission. Presented 01/01/2019 with acute onset of left-sided weakness, mild dysarthria and facial droop while up to the bathroom. Blood pressure 155 systolic upon arrival to the ED. Denied any chest pain or shortness of breath. Cranial CT scan showed a 3.1 x 2.6 x 2.3 cm acute hemorrhage in the right lateral thalamus/posterior limb internal capsule mild surrounding edema. CT angiogram of head and neck negative for vascular malformation. 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 Echocardiogram with ejection fraction of 60%. Systolic function normal. Placed on Cardene for blood pressure control. Tolerating a regular diet. Therapy evaluations completed with recommendations of physical medicine rehabilitation consult. Patient was admitted for a comprehensive rehabilitation program.   Review of Systems  Constitutional: Negative for chills and fever.  HENT: Negative for hearing loss.   Eyes: Negative for blurred vision and double vision.  Respiratory: Negative for cough and shortness of breath.   Cardiovascular: Negative for chest pain and leg swelling.  Gastrointestinal: Positive for abdominal pain, constipation and nausea. Negative for heartburn and vomiting.  Genitourinary: Negative for dysuria, flank pain and hematuria.  Musculoskeletal: Positive for myalgias.  Skin: Negative for rash.  Neurological: Positive for dizziness, speech change and focal weakness.       Occasional headaches   Psychiatric/Behavioral: Positive for depression. The patient has insomnia.   All other systems reviewed and are negative.       Past Medical History:  Diagnosis Date   Anxiety     Colon polyps      s/p colonoscopy 12/2016 wtih polyp removal    Depression      History reviewed. No pertinent surgical history. History reviewed. No pertinent family history. Social History:  reports that he quit smoking about 22 years ago. His smoking use included cigarettes. He quit after 37.00 years of use. He has never used smokeless tobacco. He reports previous alcohol use. He reports that he does not use drugs. Allergies:       Allergies  Allergen Reactions   Rocephin [Ceftriaxone] Other (See Comments)      Light headed, and nearly passed out          Medications Prior to Admission  Medication Sig Dispense Refill   ARIPiprazole (ABILIFY) 5 MG tablet Take 1 tablet (5 mg total) by mouth daily. 90 tablet 0   atorvastatin (LIPITOR) 40 MG tablet Take 40 mg by mouth daily.        cholecalciferol (VITAMIN D3) 25 MCG (1000 UT) tablet Take 1,000 Units by mouth daily.       LORazepam (ATIVAN) 1 MG tablet Take 1 mg by mouth 2 (two) times daily as needed for anxiety.       lubiprostone (AMITIZA) 24 MCG capsule Take 24 mcg by mouth 2 (two) times daily as needed for constipation.       Omega-3 Fatty Acids (FISH OIL) 1000 MG CPDR Take 1 capsule by mouth daily.       tamsulosin (FLOMAX) 0.4 MG CAPS capsule Take 0.4 mg by  mouth at bedtime.       triamterene-hydrochlorothiazide (MAXZIDE-25) 37.5-25 MG tablet Take 1 tablet by mouth daily.       venlafaxine XR (EFFEXOR-XR) 150 MG 24 hr capsule Take 300 mg by mouth daily with breakfast.       vitamin C (ASCORBIC ACID) 500 MG tablet Take 500 mg by mouth daily.       buPROPion (WELLBUTRIN XL) 300 MG 24 hr tablet Take 300 mg by mouth daily.          Drug Regimen Review Drug regimen was reviewed and remains appropriate with no significant issues  identified   Home: Home Living Family/patient expects to be discharged to:: Private residence Living Arrangements: Spouse/significant other Available Help at Discharge: Family, Available 24 hours/day Type of Home: House Home Access: Ramped entrance Home Layout: One level Bathroom Shower/Tub: Health visitor: Handicapped height Home Equipment: Environmental consultant - 2 wheels, Grab bars - tub/shower  Lives With: Spouse   Functional History: Prior Function Level of Independence: Independent Comments: active    Functional Status:  Mobility: Bed Mobility Overal bed mobility: Needs Assistance Bed Mobility: Rolling, Sidelying to Sit Rolling: Mod assist, +2 for safety/equipment Sidelying to sit: +2 for safety/equipment, +2 for physical assistance, Mod assist General bed mobility comments: educated pt in use of R side to self-assist, RUE to grasp rail and RLE hooking LE with assist for LEs over EOB and to elevate trunk, assist for sitting balance due to strong lateral lean Transfers Overall transfer level: Needs assistance Equipment used: 2 person hand held assist, Ambulation equipment used Transfer via Lift Equipment: Maxisky Transfers: Sit to/from Stand Sit to Stand: Max assist, +2 physical assistance, +2 safety/equipment General transfer comment: pt able to stand from EOB with MaxA+2 via face to face method and L knee block; pt with increased difficulty facilitating full upright posture and with strong L lateral lean; unable to safely advance RLE to perform stand pivot transfer (and recliner with malfunctioning brakes) therefore deferred to use of maxisky for totalA transfer to recliner Ambulation/Gait General Gait Details: unable   ADL: ADL Overall ADL's : Needs assistance/impaired Eating/Feeding: Set up, Sitting Eating/Feeding Details (indicate cue type and reason): PT sent message to SLP for swallowing follow up as RN reports pt with pill resting on L side of mouth and pt  unaware earlier today (due to decreased sensation) Grooming: Minimal assistance, Sitting Grooming Details (indicate cue type and reason): supported sitting Upper Body Bathing: Moderate assistance, Sitting Lower Body Bathing: Maximal assistance, +2 for physical assistance, +2 for safety/equipment, Sit to/from stand Upper Body Dressing : Moderate assistance, Maximal assistance, Sitting Lower Body Dressing: Maximal assistance, Total assistance, +2 for physical assistance, +2 for safety/equipment, Sit to/from stand Lower Body Dressing Details (indicate cue type and reason): totalA to don socks today Toilet Transfer: Total assistance, +2 for physical assistance, +2 for safety/equipment Toilet Transfer Details (indicate cue type and reason): simulated via transfer to recliner, use of maxisky for totalA transfer Toileting- Clothing Manipulation and Hygiene: Total assistance, +2 for physical assistance, +2 for safety/equipment Functional mobility during ADLs: Maximal assistance, Total assistance, +2 for physical assistance, +2 for safety/equipment General ADL Comments: pt with L side weakness, decreased attention to L side, decreased sitting and standing balance   Cognition: Cognition Overall Cognitive Status: Impaired/Different from baseline Arousal/Alertness: Awake/alert Orientation Level: Oriented X4 Attention: Focused, Sustained Focused Attention: Appears intact(Vigilance WNL: 1/1) Sustained Attention: Appears intact(Serial 7s: 2/3) Memory: Appears intact Awareness: Appears intact Problem Solving: Appears intact(4/4) Executive Function:  Reasoning, Sequencing Reasoning: Impaired Reasoning Impairment: Verbal complex(Abstraction: 1/2) Sequencing: Impaired Sequencing Impairment: Verbal complex(Clock drawing: 2/3) Safety/Judgment: Appears intact Cognition Arousal/Alertness: Awake/alert Behavior During Therapy: WFL for tasks assessed/performed Overall Cognitive Status: Impaired/Different from  baseline Area of Impairment: Problem solving, Awareness Awareness: Emergent Problem Solving: Slow processing, Decreased initiation, Requires verbal cues, Requires tactile cues General Comments: perseverating on not having his regular depression medication; slower processing noted; decreased awareness of L side    Physical Exam: Blood pressure (!) 139/92, pulse 79, temperature 97.9 F (36.6 C), temperature source Oral, resp. rate (!) 22, height  (1.854 m), weight 103 kg, SpO2 91 %. Physical Exam  Constitutional: He is oriented to person, place, and time. He appears well-developed and well-nourished.  HENT:  Head: Normocephalic and atraumatic.  Eyes: Pupils are equal, round, and reactive to light. Conjunctivae and EOM are normal.  Neck: Normal range of motion. No tracheal deviation present. No thyromegaly present.  Cardiovascular: Normal rate and regular rhythm. Exam reveals no friction rub.  No murmur heard. Respiratory: Effort normal. No respiratory distress. He has no wheezes. He has no rales.  GI: Soft. He exhibits no distension. There is no abdominal tenderness.  Musculoskeletal:        General: No edema.  Neurological: He is alert and oriented to person, place, and time.  Patient is alert and cooperative. Speech is dysarthric. Follows commands. A little distracted. Fair awareness of deficits. He can provide his name, age and date of birth. Left central VII and XII,  LUE 1-2/5 prox to distal. LLE 0-tr/5. RUE and RLE 4+/5. Sensory trace/2 LUE and LLE. DTR's 2++ on left. Early flexor tone at left wrist, extensor tone left heel.   Skin: Skin is warm. No rash noted. No erythema.  Psychiatric: He has a normal mood and affect. His behavior is normal.      Lab Results Last 48 Hours        Results for orders placed or performed during the hospital encounter of 01/01/19 (from the past 48 hour(s))  Protime-INR     Status: None    Collection Time: 01/01/19  7:50 PM  Result Value Ref  Range    Prothrombin Time 14.1 11.4 - 15.2 seconds    INR 1.1 0.8 - 1.2      Comment: (NOTE) INR goal varies based on device and disease states. Performed at [facility], [address], [place], [place] [address]    APTT     Status: None    Collection Time: 01/01/19  7:50 PM  Result Value Ref Range    aPTT 30 24 - 36 seconds      Comment: Performed at [facility], [address], [place], [place] [address]  CBC     Status: None    Collection Time: 01/01/19  7:50 PM  Result Value Ref Range    WBC 7.8 4.0 - 10.5 K/uL    RBC 5.06 4.22 - 5.81 MIL/uL    Hemoglobin 14.5 13.0 - 17.0 g/dL    HCT 30.8 65.7 - 84.6 %    MCV 89.9 80.0 - 100.0 fL    MCH 28.7 26.0 - 34.0 pg    MCHC 31.9 30.0 - 36.0 g/dL    RDW 96.2 95.2 - 84.1 %    Platelets 269 150 - 400 K/uL    nRBC 0.0 0.0 - 0.2 %      Comment: Performed at [facility], [address], [place],  [place] [address]  Differential     Status: None    Collection Time: 01/01/19  7:50 PM  Result Value Ref Range    Neutrophils Relative % 55 %    Neutro Abs 4.3 1.7 - 7.7 K/uL    Lymphocytes Relative 34 %    Lymphs Abs 2.6 0.7 - 4.0 K/uL    Monocytes Relative 8 %    Monocytes Absolute 0.7 0.1 - 1.0 K/uL    Eosinophils Relative 2 %    Eosinophils Absolute 0.2 0.0 - 0.5 K/uL    Basophils Relative 1 %    Basophils Absolute 0.1 0.0 - 0.1 K/uL    Immature Granulocytes 0 %    Abs Immature Granulocytes 0.02 0.00 - 0.07 K/uL      Comment: Performed at [facility], [address] [place], [place] [address]  Comprehensive metabolic panel     Status: Abnormal    Collection Time: 01/01/19  7:50 PM  Result Value Ref Range    Sodium 135 135 - 145 mmol/L    Potassium 3.7 3.5 - 5.1 mmol/L    Chloride 103 98 - 111 mmol/L    CO2 24 22 - 32 mmol/L    Glucose, Bld 107 (H) 70 - 99 mg/dL    BUN 11 8 - 23 mg/dL    Creatinine, Ser 0.98 0.61 - 1.24 mg/dL    Calcium 9.1 8.9 - 11.9 mg/dL    Total Protein 7.0 6.5 - 8.1 g/dL     Albumin 4.0 3.5 - 5.0 g/dL    AST 20 15 - 41 U/L    ALT 16 0 - 44 U/L    Alkaline Phosphatase 45 38 - 126 U/L    Total Bilirubin 0.6 0.3 - 1.2 mg/dL    GFR calc non Af Amer >60 >60 mL/min    GFR calc Af Amer >60 >60 mL/min    Anion gap 8 5 - 15      Comment: Performed at [facility], [address], [place], [place] [address]  Glucose, capillary     Status: Abnormal    Collection Time: 01/01/19  7:51 PM  Result Value Ref Range    Glucose-Capillary 113 (H) 70 - 99 mg/dL  I-stat Creatinine, ED     Status: None    Collection Time: 01/01/19  7:57 PM  Result Value Ref Range    Creatinine, Ser 0.70 0.61 - 1.24 mg/dL  MRSA PCR Screening     Status: None    Collection Time: 01/01/19  9:37 PM  Result Value Ref Range    MRSA by PCR NEGATIVE NEGATIVE      Comment:        The GeneXpert MRSA Assay (FDA approved for NASAL specimens only), is one component of a comprehensive MRSA colonization surveillance program. It is not intended to diagnose MRSA infection nor to guide or monitor treatment for MRSA infections. Performed at [facility], [address] [place], [place] [address]    CBC     Status: None    Collection Time: 01/03/19  4:20 AM  Result Value Ref Range    WBC 10.1 4.0 - 10.5 K/uL    RBC 5.46 4.22 - 5.81 MIL/uL    Hemoglobin 16.3 13.0 - 17.0 g/dL    HCT 30.8 65.7 - 84.6 %    MCV 87.0 80.0 - 100.0 fL    MCH 29.9 26.0 - 34.0 pg    MCHC 34.3 30.0 -  36.0 g/dL    RDW 62.913.1 52.811.5 - 41.315.5 %    Platelets 282 150 - 400 K/uL    nRBC 0.0 0.0 - 0.2 %      Comment: Performed at [facility], [address] [place], [place] [address]  Basic metabolic panel     Status: Abnormal    Collection Time: 01/03/19  4:20 AM  Result Value Ref Range    Sodium 134 (L) 135 - 145 mmol/L    Potassium 3.6 3.5 - 5.1 mmol/L    Chloride 99 98 - 111 mmol/L    CO2 24 22 - 32 mmol/L    Glucose, Bld 166 (H) 70 - 99 mg/dL    BUN 9 8 - 23 mg/dL    Creatinine, Ser 0.270.63 0.61 - 1.24 mg/dL     Calcium 9.4 8.9 - 25.310.3 mg/dL    GFR calc non Af Amer >60 >60 mL/min    GFR calc Af Amer >60 >60 mL/min    Anion gap 11 5 - 15      Comment: Performed at [facility], [address] [place], [place] [address]  Lipid panel     Status: Abnormal    Collection Time: 01/03/19  4:20 AM  Result Value Ref Range    Cholesterol 185 0 - 200 mg/dL    Triglycerides 347108 <425<150 mg/dL    HDL 52 >95>40 mg/dL    Total CHOL/HDL Ratio 3.6 RATIO    VLDL 22 0 - 40 mg/dL    LDL Cholesterol 638111 (H) 0 - 99 mg/dL      Comment:        Total Cholesterol/HDL:CHD Risk Coronary Heart Disease Risk Table                     Men   Women  1/2 Average Risk   3.4   3.3  Average Risk       5.0   4.4  2 X Average Risk   9.6   7.1  3 X Average Risk  23.4   11.0        Use the calculated Patient Ratio above and the CHD Risk Table to determine the patient's CHD Risk.        ATP III CLASSIFICATION (LDL):  <100     mg/dL   Optimal  [id number]  mg/dL   Near or Above                    Optimal  130-159  mg/dL   Borderline  [phone]  mg/dL   High  >416>190     mg/dL   Very High Performed at [facility], [address] [place], [place] [address]    Hemoglobin A1c     Status: Abnormal    Collection Time: 01/03/19  4:20 AM  Result Value Ref Range    Hgb A1c MFr Bld 5.8 (H) 4.8 - 5.6 %      Comment: (NOTE) Pre diabetes:          5.7%-6.4% Diabetes:              >6.4% Glycemic control for   <7.0% adults with diabetes      Mean Plasma Glucose 119.76 mg/dL      Comment: Performed at [facility], [address] [place], [place] [address]  Glucose, capillary     Status: Abnormal    Collection Time: 01/03/19 11:35 AM  Result Value Ref Range    Glucose-Capillary 181 (H) 70 - 99 mg/dL       Imaging Results (Last 48 hours)  Ct Angio Head W Or Wo Contrast   Addendum Date: 01/02/2019   ADDENDUM REPORT: 01/02/2019 09:47 ADDENDUM: These results were called by telephone at the time of interpretation on  01/02/2019 at 9:46 am to Dr. Marvel Plan , who verbally acknowledged these results. Electronically Signed   By: Marlan Palau M.D.   On: 01/02/2019 09:47    Result Date: 01/02/2019 CLINICAL DATA:  Intracranial hemorrhage EXAM: CT ANGIOGRAPHY HEAD AND NECK TECHNIQUE: Multidetector CT imaging of the head and neck was performed using the standard protocol during bolus administration of intravenous contrast. Multiplanar CT image reconstructions and MIPs were obtained to evaluate the vascular anatomy. Carotid stenosis measurements (when applicable) are obtained utilizing NASCET criteria, using the distal internal carotid diameter as the denominator. CONTRAST:  75mL OMNIPAQUE IOHEXOL 350 MG/ML SOLN COMPARISON:  CT head 01/01/2019 FINDINGS: CTA NECK FINDINGS Aortic arch: Standard branching. Imaged portion shows no evidence of aneurysm or dissection. No significant stenosis of the major arch vessel origins. Right carotid system: Normal right carotid system. No atherosclerotic disease stenosis or dissection Left carotid system: Normal left carotid system. Negative for atherosclerotic disease, stenosis, or dissection Vertebral arteries: Both vertebral arteries widely patent to the basilar without stenosis. Skeleton: Mild cervical spine degenerative change. No acute skeletal abnormality. Other neck: Left thyroid mass measuring 4.3 x 3.0 cm with irregular enhancement. Right lobe of the thyroid normal. No adenopathy in the neck. Upper chest: Negative Review of the MIP images confirms the above findings CTA HEAD FINDINGS Anterior circulation: No significant stenosis, proximal occlusion, aneurysm, or vascular malformation. Posterior circulation: No significant stenosis, proximal occlusion, aneurysm, or vascular malformation. Venous sinuses: Normal venous enhancement Anatomic variants: Negative for vascular malformation Delayed phase: Left thalamic hemorrhage shows mild progression now measuring 3.1 x 2.5 cm, compared with 2.6 x  2.3 cm. Interval intraventricular extension of hemorrhage with blood in the occipital horns bilaterally right greater than left. Ventricles remain mildly enlarged but stable and most likely due to atrophy. No enhancing mass lesion. Review of the MIP images confirms the above findings IMPRESSION: 1. Interval progression of hemorrhage in the right thalamic with intraventricular extension. Mild ventricular enlargement is stable and consistent with atrophy. 2. Negative for vascular malformation. 3. 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  Biopsy recommended. Electronically Signed: By: Marlan Palau M.D. On: 01/02/2019 09:22    Ct Head Wo Contrast   Result Date: 01/02/2019 CLINICAL DATA:  Follow-up examination for known intracranial hemorrhage. EXAM: CT HEAD WITHOUT CONTRAST TECHNIQUE: Contiguous axial images were obtained from the base of the skull through the vertex without intravenous contrast. COMPARISON:  Prior CT from previous same day as well as 01/01/2019. FINDINGS: Brain: Right thalamic hemorrhage measures 3.1 x 2.7 x 3.4 cm (estimated volume 14 cc). Localized vasogenic edema is relatively similar. Associated intraventricular extension with blood in right greater than left lateral ventricles. Overall ventricular size is relatively stable as compared to previous exam without worsening hydrocephalus or ventricular entrapment. No midline shift. No other acute intracranial hemorrhage. No other acute large vessel territory infarct. Underlying atrophy with chronic microvascular ischemic disease again noted. No extra-axial fluid collection. Vascular: Contrast material seen within the intracranial vasculature related to prior CT. Skull: Scalp soft tissues and calvarium within normal limits. Sinuses/Orbits: Globes and orbital soft tissues demonstrate no acute finding. Paranasal sinuses and mastoid air cells are clear.  Other: None. IMPRESSION: 1. Mild interval progression of right thalamic hemorrhage as compared to  01/01/2019, estimated volume 14 cc. Associated intraventricular extension with blood layering within the right greater than left lateral ventricles. Stable ventricular size without hydrocephalus or entrapment. 2. No other new acute intracranial abnormality. Electronically Signed   By: Rise Mu M.D.   On: 01/02/2019 17:31    Ct Angio Neck W Or Wo Contrast   Addendum Date: 01/02/2019   ADDENDUM REPORT: 01/02/2019 09:47 ADDENDUM: These results were called by telephone at the time of interpretation on 01/02/2019 at 9:46 am to Dr. Marvel Plan , who verbally acknowledged these results. Electronically Signed   By: Marlan Palau M.D.   On: 01/02/2019 09:47    Result Date: 01/02/2019 CLINICAL DATA:  Intracranial hemorrhage EXAM: CT ANGIOGRAPHY HEAD AND NECK TECHNIQUE: Multidetector CT imaging of the head and neck was performed using the standard protocol during bolus administration of intravenous contrast. Multiplanar CT image reconstructions and MIPs were obtained to evaluate the vascular anatomy. Carotid stenosis measurements (when applicable) are obtained utilizing NASCET criteria, using the distal internal carotid diameter as the denominator. CONTRAST:  23mL OMNIPAQUE IOHEXOL 350 MG/ML SOLN COMPARISON:  CT head 01/01/2019 FINDINGS: CTA NECK FINDINGS Aortic arch: Standard branching. Imaged portion shows no evidence of aneurysm or dissection. No significant stenosis of the major arch vessel origins. Right carotid system: Normal right carotid system. No atherosclerotic disease stenosis or dissection Left carotid system: Normal left carotid system. Negative for atherosclerotic disease, stenosis, or dissection Vertebral arteries: Both vertebral arteries widely patent to the basilar without stenosis. Skeleton: Mild cervical spine degenerative change. No acute skeletal abnormality. Other neck: Left thyroid mass measuring 4.3 x 3.0 cm with irregular enhancement. Right lobe of the thyroid normal. No adenopathy in  the neck. Upper chest: Negative Review of the MIP images confirms the above findings CTA HEAD FINDINGS Anterior circulation: No significant stenosis, proximal occlusion, aneurysm, or vascular malformation. Posterior circulation: No significant stenosis, proximal occlusion, aneurysm, or vascular malformation. Venous sinuses: Normal venous enhancement Anatomic variants: Negative for vascular malformation Delayed phase: Left thalamic hemorrhage shows mild progression now measuring 3.1 x 2.5 cm, compared with 2.6 x 2.3 cm. Interval intraventricular extension of hemorrhage with blood in the occipital horns bilaterally right greater than left. Ventricles remain mildly enlarged but stable and most likely due to atrophy. No enhancing mass lesion. Review of the MIP images confirms the above findings IMPRESSION: 1. Interval progression of hemorrhage in the right thalamic with intraventricular extension. Mild ventricular enlargement is stable and consistent with atrophy. 2. Negative for vascular malformation. 3. 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  Biopsy recommended. Electronically Signed: By: Marlan Palau M.D. On: 01/02/2019 09:22    Ct Head Code Stroke Wo Contrast   Result Date: 01/01/2019 CLINICAL DATA:  Code stroke. Last seen normal 1850 hours. Left-sided weakness and slurred speech. EXAM: CT HEAD WITHOUT CONTRAST TECHNIQUE: Contiguous axial images were obtained from the base of the skull through the vertex without intravenous contrast. COMPARISON:  12/31/2017 FINDINGS: Brain: 3.1 x 2.6 x 2.3 cm (volume = 9.7 cm^3) acute hemorrhage within the right lateral thalamus/posterior limb internal capsule region. Mild surrounding edema. No intraventricular penetration at this time. Atrophy and chronic small-vessel ischemic changes elsewhere. No sign of acute cortical stroke. No mass lesion, hydrocephalus or extra-axial collection. Vascular: No abnormal vascular finding. Skull: Negative Sinuses/Orbits: Clear/normal Other: None  IMPRESSION: 1. 3.1 x 2.6 x 2.3 cm acute hemorrhage in the right lateral thalamus/posterior limb internal  capsule with mild surrounding edema. This could be a primary hemorrhage or hemorrhagic infarction. 2. Atrophy and chronic small-vessel ischemic changes elsewhere. 3. ASPECTS is not applicable in this setting. 4. These results were communicated to Dr. Laurence Slate at 8:02 pmon 3/24/2020by text page via the [facility] messaging system. Electronically Signed   By: Paulina Fusi M.D.   On: 01/01/2019 20:03             Medical Problem List and Plan: 1.  Left side weakness with facial droop as well as left hemi-sensory loss secondary to right thalamic hemorrhage with IVH secondary to hypertensive crisis             -admit to inpatient rehab             -left WHO and PRAFO ordered 2.  Antithrombotics: -DVT/anticoagulation:  SCDs             -antiplatelet therapy: N/A 3. Pain Management: Tylenol as needed 4. Mood:  Abilify 5 mg daily, Effexor 300 mg daily. Provide emotional support             -antipsychotic agents: N/A 5. Neuropsych: This patient is capable of making decisions on his own behalf. 6. Skin/Wound Care:  Routine skin checks 7. Fluids/Electrolytes/Nutrition:  Routine ins and outs with follow-up chemistries 8. Lisinopril 20 mg daily, Maxide 37.5-25 mg daily. Monitor with increased mobility 9. BPH. Flomax 0.4 mg daily. 10. History of GI bleed . Continue Protonix             -pt reports persistent nausea and is interested in bringing a supplement in from home which works for him. I told him we could review herb if/when it is brought in  -in the mean time may use zofran po disintegrating tab 11. Hyperlipidemia. Resume Lipitor on discharge.       Post Admission Physician Evaluation: 1. Functional deficits secondary  to right thalamic, internal capsule hemorrhage with intraventricular extension. 2. Patient is admitted to receive collaborative, interdisciplinary care between the physiatrist, rehab  nursing staff, and therapy team. 3. Patient's level of medical complexity and substantial therapy needs in context of that medical necessity cannot be provided at a lesser intensity of care such as a SNF. 4. Patient has experienced substantial functional loss from his/her baseline which was documented above under the "Functional History" and "Functional Status" headings.  Judging by the patient's diagnosis, physical exam, and functional history, the patient has potential for functional progress which will result in measurable gains while on inpatient rehab.  These gains will be of substantial and practical use upon discharge  in facilitating mobility and self-care at the household level. 5. Physiatrist will provide 24 hour management of medical needs as well as oversight of the therapy plan/treatment and provide guidance as appropriate regarding the interaction of the two. 6. The Preadmission Screening has been reviewed and patient status is unchanged unless otherwise stated above. 7. 24 hour rehab nursing will assist with bladder management, bowel management, safety, skin/wound care, disease management, medication administration, pain management and patient education  and help integrate therapy concepts, techniques,education, etc. 8. PT will assess and treat for/with: Lower extremity strength, range of motion, stamina, balance, functional mobility, safety, adaptive techniques and equipment, NMR, visual-spatial awareness, family ed.   Goals are: min assist. 9. OT will assess and treat for/with: ADL's, functional mobility, safety, upper extremity strength, adaptive techniques and equipment, NMR, family ed, visual-spatial awareness.   Goals are: min assist. Therapy may proceed with showering this patient.  10. SLP will assess and treat for/with: cognition, communication, family ed.  Goals are: supervision. 11. Case Management and Social Worker will assess and treat for psychological issues and discharge  planning. 12. Team conference will be held weekly to assess progress toward goals and to determine barriers to discharge. 13. Patient will receive at least 3 hours of therapy per day at least 5 days per week. 14. ELOS: 14-20 days       15. Prognosis:  excellent   I have personally performed a face to face diagnostic evaluation of this patient and formulated the key components of the plan.  Additionally, I have personally reviewed laboratory data, imaging studies, as well as relevant notes and concur with the physician assistant's documentation above.  Ranelle Oyster, MD, FAAPMR     Mcarthur Rossetti Angiulli, PA-C 01/03/2019

## 2019-01-04 NOTE — Progress Notes (Signed)
Inpatient Rehabilitation Admissions Coordinator  I have insurance approval and bed available to admit pt to inpt rehab ., I met with patient at bedside an dhe prefers inpt rehab at this time., I have verified with his wife by phone. I have contacted Burnetta Sabin, [facility] for d/c as well as SW, Kathlee Nations. I will make the arrangements to admit today.  Danne Baxter, RN, MSN Rehab Admissions Coordinator [phone] 01/04/2019 11:31 AM'

## 2019-01-04 NOTE — Discharge Summary (Addendum)
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 Anxiety   . Colon polyps    s/p colonoscopy 12/2016 wtih polyp removal   . Depression    History reviewed. No pertinent surgical history.  Medications to be continued on Rehab Allergies as of 01/04/2019      Reactions   Rocephin [ceftriaxone] Other (See Comments)   Light headed, and nearly passed out      Medication List    TAKE these medications   ARIPiprazole 5 MG tablet Commonly known as:  ABILIFY Take 1 tablet (5 mg total) by mouth daily.   atorvastatin 40 MG tablet Commonly known as:  LIPITOR Take 40 mg by mouth daily.   cholecalciferol 25 MCG (1000 UT) tablet Commonly known as:  VITAMIN D3 Take 1,000 Units by mouth daily.   Fish Oil 1000 MG Cpdr Take 1 capsule by mouth daily.   lisinopril 20 MG tablet Commonly known as:  PRINIVIL,ZESTRIL Take 1 tablet (20 mg total) by mouth daily. Start taking on:  January 05, 2019   LORazepam 1 MG tablet Commonly known as:  ATIVAN Take 1 mg by mouth 2 (two) times daily as needed for anxiety.   lubiprostone 24 MCG capsule Commonly known as:  AMITIZA Take 24 mcg by mouth 2 (two) times daily as needed for constipation.   senna-docusate 8.6-50 MG tablet Commonly known as:  Senokot-S Take 1 tablet by mouth 2 (two) times daily.   tamsulosin 0.4 MG Caps capsule Commonly known as:  FLOMAX Take 0.4 mg by mouth at bedtime.    triamterene-hydrochlorothiazide 37.5-25 MG tablet Commonly known as:  MAXZIDE-25 Take 1 tablet by mouth daily.   venlafaxine XR 150 MG 24 hr capsule Commonly known as:  EFFEXOR-XR Take 300 mg by mouth daily with breakfast.   vitamin C 500 MG tablet Commonly known as:  ASCORBIC ACID Take 500 mg by mouth daily.   Wellbutrin XL 300 MG 24 hr tablet Generic drug:  buPROPion Take 300 mg by mouth daily.       LABORATORY STUDIES CBC    Component Value Date/Time   WBC 11.1 (H) 01/04/2019 0315   RBC 5.67 01/04/2019 0315   HGB 16.6 01/04/2019 0315   HCT 50.0 01/04/2019 0315   PLT 301 01/04/2019 0315   MCV 88.2 01/04/2019 0315   MCH 29.3 01/04/2019 0315   MCHC 33.2 01/04/2019 0315   RDW 13.1 01/04/2019 0315   LYMPHSABS 2.6 01/01/2019 1950   MONOABS 0.7 01/01/2019 1950   EOSABS 0.2 01/01/2019 1950   BASOSABS 0.1 01/01/2019 1950   CMP    Component Value Date/Time   NA 132 (L) 01/04/2019 0315   K 3.5 01/04/2019 0315   CL 95 (L) 01/04/2019 0315   CO2 23 01/04/2019 0315   GLUCOSE 157 (H) 01/04/2019 0315   BUN 14 01/04/2019 0315   CREATININE 0.76 01/04/2019 0315   CALCIUM 9.5 01/04/2019 0315   PROT 7.0 01/01/2019 1950  ALBUMIN 4.0 01/01/2019 1950   AST 20 01/01/2019 1950   ALT 16 01/01/2019 1950   ALKPHOS 45 01/01/2019 1950   BILITOT 0.6 01/01/2019 1950   GFRNONAA >60 01/04/2019 0315   GFRAA >60 01/04/2019 0315   COAGS Lab Results  Component Value Date   INR 1.1 01/01/2019   Lipid Panel    Component Value Date/Time   CHOL 185 01/03/2019 0420   TRIG 108 01/03/2019 0420   HDL 52 01/03/2019 0420   CHOLHDL 3.6 01/03/2019 0420   VLDL 22 01/03/2019 0420   LDLCALC 111 (H) 01/03/2019 0420   HgbA1C  Lab Results  Component Value Date   HGBA1C 5.8 (H) 01/03/2019   Urinalysis    Component Value Date/Time   COLORURINE YELLOW 12/31/2017 1037   APPEARANCEUR CLEAR 12/31/2017 1037   LABSPEC 1.031 (H) 12/31/2017 1037   PHURINE 7.0 12/31/2017 1037   GLUCOSEU NEGATIVE  12/31/2017 1037   HGBUR LARGE (A) 12/31/2017 1037   BILIRUBINUR NEGATIVE 12/31/2017 1037   KETONESUR NEGATIVE 12/31/2017 1037   PROTEINUR NEGATIVE 12/31/2017 1037   NITRITE NEGATIVE 12/31/2017 1037   LEUKOCYTESUR NEGATIVE 12/31/2017 1037   Urine Drug Screen No results found for: LABOPIA, COCAINSCRNUR, LABBENZ, AMPHETMU, THCU, LABBARB  Alcohol Level No results found for: [facility]   SIGNIFICANT DIAGNOSTIC STUDIES Ct Angio Head W Or Wo Contrast  Addendum Date: 01/02/2019   ADDENDUM REPORT: 01/02/2019 09:47 ADDENDUM: These results were called by telephone at the time of interpretation on 01/02/2019 at 9:46 am to Dr. Marvel Plan , who verbally acknowledged these results. Electronically Signed   By: Marlan Palau M.D.   On: 01/02/2019 09:47   Result Date: 01/02/2019 CLINICAL DATA:  Intracranial hemorrhage EXAM: CT ANGIOGRAPHY HEAD AND NECK TECHNIQUE: Multidetector CT imaging of the head and neck was performed using the standard protocol during bolus administration of intravenous contrast. Multiplanar CT image reconstructions and MIPs were obtained to evaluate the vascular anatomy. Carotid stenosis measurements (when applicable) are obtained utilizing NASCET criteria, using the distal internal carotid diameter as the denominator. CONTRAST:  75mL OMNIPAQUE IOHEXOL 350 MG/ML SOLN COMPARISON:  CT head 01/01/2019 FINDINGS: CTA NECK FINDINGS Aortic arch: Standard branching. Imaged portion shows no evidence of aneurysm or dissection. No significant stenosis of the major arch vessel origins. Right carotid system: Normal right carotid system. No atherosclerotic disease stenosis or dissection Left carotid system: Normal left carotid system. Negative for atherosclerotic disease, stenosis, or dissection Vertebral arteries: Both vertebral arteries widely patent to the basilar without stenosis. Skeleton: Mild cervical spine degenerative change. No acute skeletal abnormality. Other neck: Left thyroid mass measuring 4.3 x 3.0  cm with irregular enhancement. Right lobe of the thyroid normal. No adenopathy in the neck. Upper chest: Negative Review of the MIP images confirms the above findings CTA HEAD FINDINGS Anterior circulation: No significant stenosis, proximal occlusion, aneurysm, or vascular malformation. Posterior circulation: No significant stenosis, proximal occlusion, aneurysm, or vascular malformation. Venous sinuses: Normal venous enhancement Anatomic variants: Negative for vascular malformation Delayed phase: Left thalamic hemorrhage shows mild progression now measuring 3.1 x 2.5 cm, compared with 2.6 x 2.3 cm. Interval intraventricular extension of hemorrhage with blood in the occipital horns bilaterally right greater than left. Ventricles remain mildly enlarged but stable and most likely due to atrophy. No enhancing mass lesion. Review of the MIP images confirms the above findings IMPRESSION: 1. Interval progression of hemorrhage in the right thalamic with intraventricular extension. Mild ventricular enlargement is stable and consistent with atrophy. 2. Negative for vascular malformation. 3.  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  Biopsy recommended. Electronically Signed: By: Marlan Palau M.D. On: 01/02/2019 09:22   Ct Head Wo Contrast  Result Date: 01/02/2019 CLINICAL DATA:  Follow-up examination for known intracranial hemorrhage. EXAM: CT HEAD WITHOUT CONTRAST TECHNIQUE: Contiguous axial images were obtained from the base of the skull through the vertex without intravenous contrast. COMPARISON:  Prior CT from previous same day as well as 01/01/2019. FINDINGS: Brain: Right thalamic hemorrhage measures 3.1 x 2.7 x 3.4 cm (estimated volume 14 cc). Localized vasogenic edema is relatively similar. Associated intraventricular extension with blood in right greater than left lateral ventricles. Overall ventricular size is relatively stable as compared to previous exam without worsening hydrocephalus or ventricular entrapment. No  midline shift. No other acute intracranial hemorrhage. No other acute large vessel territory infarct. Underlying atrophy with chronic microvascular ischemic disease again noted. No extra-axial fluid collection. Vascular: Contrast material seen within the intracranial vasculature related to prior CT. Skull: Scalp soft tissues and calvarium within normal limits. Sinuses/Orbits: Globes and orbital soft tissues demonstrate no acute finding. Paranasal sinuses and mastoid air cells are clear. Other: None. IMPRESSION: 1. Mild interval progression of right thalamic hemorrhage as compared to 01/01/2019, estimated volume 14 cc. Associated intraventricular extension with blood layering within the right greater than left lateral ventricles. Stable ventricular size without hydrocephalus or entrapment. 2. No other new acute intracranial abnormality. Electronically Signed   By: Rise Mu M.D.   On: 01/02/2019 17:31   Ct Angio Neck W Or Wo Contrast  Addendum Date: 01/02/2019   ADDENDUM REPORT: 01/02/2019 09:47 ADDENDUM: These results were called by telephone at the time of interpretation on 01/02/2019 at 9:46 am to Dr. Marvel Plan , who verbally acknowledged these results. Electronically Signed   By: Marlan Palau M.D.   On: 01/02/2019 09:47   Result Date: 01/02/2019 CLINICAL DATA:  Intracranial hemorrhage EXAM: CT ANGIOGRAPHY HEAD AND NECK TECHNIQUE: Multidetector CT imaging of the head and neck was performed using the standard protocol during bolus administration of intravenous contrast. Multiplanar CT image reconstructions and MIPs were obtained to evaluate the vascular anatomy. Carotid stenosis measurements (when applicable) are obtained utilizing NASCET criteria, using the distal internal carotid diameter as the denominator. CONTRAST:  68mL OMNIPAQUE IOHEXOL 350 MG/ML SOLN COMPARISON:  CT head 01/01/2019 FINDINGS: CTA NECK FINDINGS Aortic arch: Standard branching. Imaged portion shows no evidence of aneurysm or  dissection. No significant stenosis of the major arch vessel origins. Right carotid system: Normal right carotid system. No atherosclerotic disease stenosis or dissection Left carotid system: Normal left carotid system. Negative for atherosclerotic disease, stenosis, or dissection Vertebral arteries: Both vertebral arteries widely patent to the basilar without stenosis. Skeleton: Mild cervical spine degenerative change. No acute skeletal abnormality. Other neck: Left thyroid mass measuring 4.3 x 3.0 cm with irregular enhancement. Right lobe of the thyroid normal. No adenopathy in the neck. Upper chest: Negative Review of the MIP images confirms the above findings CTA HEAD FINDINGS Anterior circulation: No significant stenosis, proximal occlusion, aneurysm, or vascular malformation. Posterior circulation: No significant stenosis, proximal occlusion, aneurysm, or vascular malformation. Venous sinuses: Normal venous enhancement Anatomic variants: Negative for vascular malformation Delayed phase: Left thalamic hemorrhage shows mild progression now measuring 3.1 x 2.5 cm, compared with 2.6 x 2.3 cm. Interval intraventricular extension of hemorrhage with blood in the occipital horns bilaterally right greater than left. Ventricles remain mildly enlarged but stable and most likely due to atrophy. No enhancing mass lesion. Review of the MIP  images confirms the above findings IMPRESSION: 1. Interval progression of hemorrhage in the right thalamic with intraventricular extension. Mild ventricular enlargement is stable and consistent with atrophy. 2. Negative for vascular malformation. 3. 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  Biopsy recommended. Electronically Signed: By: Marlan Palau M.D. On: 01/02/2019 09:22   Ct Head Code Stroke Wo Contrast  Result Date: 01/01/2019 CLINICAL DATA:  Code stroke. Last seen normal 1850 hours. Left-sided weakness and slurred speech. EXAM: CT HEAD WITHOUT CONTRAST TECHNIQUE: Contiguous axial  images were obtained from the base of the skull through the vertex without intravenous contrast. COMPARISON:  12/31/2017 FINDINGS: Brain: 3.1 x 2.6 x 2.3 cm (volume = 9.7 cm^3) acute hemorrhage within the right lateral thalamus/posterior limb internal capsule region. Mild surrounding edema. No intraventricular penetration at this time. Atrophy and chronic small-vessel ischemic changes elsewhere. No sign of acute cortical stroke. No mass lesion, hydrocephalus or extra-axial collection. Vascular: No abnormal vascular finding. Skull: Negative Sinuses/Orbits: Clear/normal Other: None IMPRESSION: 1. 3.1 x 2.6 x 2.3 cm acute hemorrhage in the right lateral thalamus/posterior limb internal capsule with mild surrounding edema. This could be a primary hemorrhage or hemorrhagic infarction. 2. Atrophy and chronic small-vessel ischemic changes elsewhere. 3. ASPECTS is not applicable in this setting. 4. These results were communicated to Dr. Laurence Slate at 8:02 pmon 3/24/2020by text page via the [facility] messaging system. Electronically Signed   By: Paulina Fusi M.D.   On: 01/01/2019 20:03    2D Echocardiogram  1. The left ventricle has normal systolic function, with an ejection fraction of 55-60%. The cavity size was normal. There is moderately increased left ventricular wall thickness. Left ventricular diastolic Doppler parameters are consistent with  impaired relaxation. No evidence of left ventricular regional wall motion abnormalities. 2. The right ventricle has normal systolic function. The cavity was normal. There is no increase in right ventricular wall thickness. 3. Trivial calcification of the anterior mitral valve leaflet tip of the mitral valve leaflet. 4. The aortic valve is tricuspid Aortic valve regurgitation was not assessed by color flow Doppler. 5. There is mild dilatation of the ascending aorta measuring 39 mm. 6. The interatrial septum appears to be lipomatous.    HISTORY OF PRESENT ILLNESS  Patrick Pollard is a [name] with PMH of Depression, remote GI bleeding due to polyp s/p resection who presents to ED as a stroke alert. He was last known well at 6:50 PM on 01/01/2019 when he was at his friend's house and while at the bathroom suddenly became weak on the left side.  Patient gradually went down on the floor and EMS was called.  Patient's blood pressure was 155 systolic on arrival.  CT head was obtained which showed right thalamic hemorrhage without intraventricular extension.  On assessment patient is awake and alert.  Neglected the left side to sensory examination.  Denies taking any antiplatelets. NIHSS: 14. Baseline MRS 0.  Intracerebral Hemorrhage (ICH) Score is 0.  HOSPITAL COURSE Mr. Patrick Pollard is a 73 y.o. male with history of depression, remote GIB d/t polyp s/p resection presenting with L sided weakness. CT showed R thalamic hemorrhage.   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 Small vessel disease. Atrophy.     CTA head & neck interval progression R thalamic hmg w/ IVH. Mild ventricular enlargement. No AVMs. L thyroid mass.  CT repeat stable hematoma. R>L lat  IVH. No hydrocephalus.  2D Echo EF 55-60%. No source of embolus. Lipomatous IAS  LDL 111  HgbA1c 5.8  No antithrombotic prior to admission, now on No antithrombotic given hemorrhage.  Therapy recommendations:  CIR  Disposition:  CIR  Hypertensive emergency  BP improved on cardene gtt for BP control  SBP goal < 160 in hospital  Home meds:  triamterene-HCTZ 37.5-25 daily  resumed home meds and added lisinopril  BP goal now normotensive  Hyperlipidemia  on liptor 40 PTA  LDL 111  Held statin in hospital given hemorrhage  Now start lipitor 20 on d/c  Other Stroke Risk Factors  Advanced age  Former Cigarette smoker, quit 22 yrs ago  Hx ETOH use  Other Active  Problems  Hx GIB d/t colon polyp s/p resection  Anxiety/depression on effexor, wellbutrin, abilify - resume home meds  BPH on flomax - resume home meds  L thyroid mass, bx recommended, need to do as outpt  DISCHARGE EXAM Blood pressure (!) 158/100, pulse 80, temperature 97.9 F (36.6 C), temperature source Oral, resp. rate 16, height  (1.854 m), weight 103 kg, SpO2 94 %. General - Well nourished, well developed, in no apparent distress.  Ophthalmologic - fundi not visualized due to noncooperation.  Cardiovascular - Regular rate and rhythm.  Mental Status -  Level of arousal and orientation to time, place, and person were intact. Language including expression, naming, repetition, comprehension was assessed and found intact. Mild dysarthria Fund of Knowledge was assessed and was intact.  Cranial Nerves II - XII - II - Visual field intact OU. III, IV, VI - Extraocular movements intact. V - Facial sensation diminished on the left. VII - mild left facial droop VIII - Hearing & vestibular intact bilaterally. X - Palate elevates symmetrically. XI - Chin turning & shoulder shrug intact bilaterally. XII - Tongue protrusion intact.  Motor Strength - The patient's strength was normal in RUE and RLE, 0/5 LUE and LLE.  Bulk was normal and fasciculations were absent.   Motor Tone - Muscle tone was assessed at the neck and appendages and was normal.  Reflexes - The patient's reflexes were diminished on the left and he had no pathological reflexes.  Sensory - Light touch, temperature/pinprick were assessed and were diminished on the left.    Coordination - The patient had normal movements in the right hand with no ataxia or dysmetria.  Tremor was absent.  Gait and Station - deferred.   Discharge Diet  Regular, thin liquids  DISCHARGE PLAN  Disposition:  Transfer to [facility] for ongoing PT, OT and ST  Due to hemorrhage and risk of bleeding, do not  take aspirin, aspirin-containing medications, or ibuprofen products   Recommend ongoing risk factor control by Primary Care Physician at time of discharge from inpatient rehabilitation.  L thyroid mass, bx recommended, need to do as outpt  Follow-up primary care provider in 2 weeks following discharge from rehab.  Follow-up in [facility] in 4 weeks following discharge from rehab, office to schedule an appointment.   35 minutes were spent preparing discharge.  Marvel Plan, MD PhD Stroke Neurology 01/04/2019 12:37 PM

## 2019-01-04 NOTE — Progress Notes (Addendum)
Patient admitted to IP Rehab at time via bed. Denies pain at time. Patient oriented to unit and staff.   Kalman Shan, LPN.

## 2019-01-04 NOTE — Progress Notes (Addendum)
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 PMhx: GIB, anxiety/depression, BPH, left thyroid mass   OT comments  Pt presents supine in bed motivated to work with therapy, reports feeling better today compared to yesterday. Pt continues to require heavy two person assist for bed mobility and to perform functional transfers due to strong L lateral lean. Use of sara Stedy for transfer to recliner today, pt with reports of nausea seated EOB which subsided with repositioning and semi-reclining chair end of session. Pt requiring overall maxA for static sitting balance EOB, is able to correct lateral lean while sitting with assist provided and maintain briefly. Pt performing simple grooming ADL seated EOB with setup assist for ADL task. Also noted trace muscle activation in biceps with LUE PROM. Feel he remains an excellent candidate for CIR level services at time of discharge. Will continue to follow acutely.    Follow Up Recommendations  CIR;Supervision/Assistance - 24 hour    Equipment Recommendations  Other (comment)(defer to next venue)          Precautions / Restrictions Precautions Precautions: Fall Precaution Comments: L hemi, SbP <160 Restrictions Weight Bearing Restrictions: No       Mobility Bed Mobility Overal bed mobility: Needs Assistance Bed Mobility: Supine to Sit     Supine to sit: Max assist;+2 for physical assistance;+2 for safety/equipment     General bed mobility comments: assist for LLE, heavy assist to elevate trunk with pt using RUE to push up on bedrail; strong L lateral lean and pushing with RUE once sitting upright requiring increased assist to steady  Transfers Overall transfer level: Needs assistance Equipment used: Ambulation equipment used Transfers: Sit  to/from Stand Sit to Stand: Max assist;+2 physical assistance;+2 safety/equipment         General transfer comment: use of Corene Cornea for sit<>stand and transfer to recliner today; pt requiring heavy +2 assist for sit<>stand with use of bed pad to assist with hips upright; pt with strong lateral lean seated on seat of Stedy - is able to correct with cues but continues to require assist to maintain sitting balance with transfer via stedy to recliner     Balance Overall balance assessment: Needs assistance Sitting-balance support: Feet supported;Single extremity supported Sitting balance-Leahy Scale: Poor Sitting balance - Comments: continues to demosntrate strong L lateral lean and intermittent pushing with RUE; overall maxA for sitting balance  Postural control: Left lateral lean Standing balance support: Single extremity supported Standing balance-Leahy Scale: Zero Standing balance comment: maxA+2 for standing balance                           ADL either performed or assessed with clinical judgement   ADL Overall ADL's : Needs assistance/impaired     Grooming: Set up;Min guard;Sitting;Wash/dry face;Maximal assistance Grooming Details (indicate cue type and reason): setup for actual task performance; maxA for sitting balance EOB                              Functional mobility during ADLs: Maximal assistance;+2 for physical assistance;+2 for safety/equipment General ADL Comments: pt continues to have strong lateral lean when sitting/standing; educated pt on importance of attending to L side due to decreased sensation      Vision  Additional Comments: educated pt for increased need to attend to L side; mild L inattention at this time but is notable    Perception     Praxis      Cognition Arousal/Alertness: Awake/alert Behavior During Therapy: WFL for tasks assessed/performed Overall Cognitive Status: Impaired/Different from baseline Area of  Impairment: Problem solving;Awareness;Safety/judgement                         Safety/Judgement: Decreased awareness of deficits Awareness: Emergent Problem Solving: Slow processing;Decreased initiation;Requires verbal cues;Requires tactile cues General Comments: mild decrease with insight into current deficits as pt stating he can stand despite needing heavy two person assist to do so; slower processing noted but overall WFL for basic tasks - will continue to assess higher level cognition         Exercises     Shoulder Instructions       General Comments IV site bleeding at LUE, RN notified and present end of session; pt with nausea sitting EOB, reports decreased once positioned and semi-reclined in chair; pt reports poor PO intake past couple of days - encouraged to eat to maintain strength/stamina    Pertinent Vitals/ Pain       Pain Assessment: No/denies pain  Home Living                                          Prior Functioning/Environment              Frequency  Min 3X/week        Progress Toward Goals  OT Goals(current goals can now be found in the care plan section)  Progress towards OT goals: Progressing toward goals  Acute Rehab OT Goals Patient Stated Goal: regain his independence OT Goal Formulation: With patient Time For Goal Achievement: 01/17/19 Potential to Achieve Goals: Good  Plan Discharge plan remains appropriate    Co-evaluation    PT/OT/SLP Co-Evaluation/Treatment: Yes Reason for Co-Treatment: Complexity of the patient's impairments (multi-system involvement);For patient/therapist safety;To address functional/ADL transfers   OT goals addressed during session: Strengthening/ROM;ADL's and self-care      AM-PAC OT "6 Clicks" Daily Activity     Outcome Measure   Help from another person eating meals?: A Little Help from another person taking care of personal grooming?: A Little Help from another person  toileting, which includes using toliet, bedpan, or urinal?: Total Help from another person bathing (including washing, rinsing, drying)?: A Lot Help from another person to put on and taking off regular upper body clothing?: A Lot Help from another person to put on and taking off regular lower body clothing?: Total 6 Click Score: 12    End of Session Equipment Utilized During Treatment: Gait belt  OT Visit Diagnosis: Unsteadiness on feet (R26.81);Other abnormalities of gait and mobility (R26.89);Muscle weakness (generalized) (M62.81);Hemiplegia and hemiparesis Hemiplegia - Right/Left: Left Hemiplegia - dominant/non-dominant: Non-Dominant Hemiplegia - caused by: Cerebral infarction   Activity Tolerance Patient tolerated treatment well;Other (comment)(slightly limited due to nausea)   Patient Left in chair;with call bell/phone within reach;with chair alarm set   Nurse Communication Mobility status;Need for lift equipment        Time: 0822-0859 OT Time Calculation (min): 37 min  Charges: OT General Charges $OT Visit: 1 Visit OT Treatments $Self Care/Home Management : 8-22 mins  Marcy Siren, OT Supplemental Rehabilitation Services Pager [phone] Office [phone]  Orlando Penner 01/04/2019, 9:51 AM

## 2019-01-05 ENCOUNTER — Inpatient Hospital Stay (HOSPITAL_COMMUNITY): Payer: Self-pay | Admitting: Physical Therapy

## 2019-01-05 ENCOUNTER — Inpatient Hospital Stay (HOSPITAL_COMMUNITY): Payer: Self-pay

## 2019-01-05 ENCOUNTER — Inpatient Hospital Stay (HOSPITAL_COMMUNITY): Payer: Self-pay | Admitting: Speech Pathology

## 2019-01-05 DIAGNOSIS — I69398 Other sequelae of cerebral infarction: Secondary | ICD-10-CM

## 2019-01-05 DIAGNOSIS — I69154 Hemiplegia and hemiparesis following nontraumatic intracerebral hemorrhage affecting left non-dominant side: Principal | ICD-10-CM

## 2019-01-05 DIAGNOSIS — I69322 Dysarthria following cerebral infarction: Secondary | ICD-10-CM

## 2019-01-05 DIAGNOSIS — R209 Unspecified disturbances of skin sensation: Secondary | ICD-10-CM

## 2019-01-05 MED ORDER — AMLODIPINE BESYLATE 2.5 MG PO TABS
2.5000 mg | ORAL_TABLET | Freq: Every day | ORAL | Status: DC
  Administered 2019-01-05: 2.5 mg via ORAL
  Filled 2019-01-05: qty 1

## 2019-01-05 MED ORDER — ONDANSETRON 4 MG PO TBDP
4.0000 mg | ORAL_TABLET | Freq: Four times a day (QID) | ORAL | Status: DC | PRN
  Administered 2019-01-05 – 2019-01-22 (×10): 4 mg via ORAL
  Filled 2019-01-05 (×11): qty 1

## 2019-01-05 MED ORDER — CLONIDINE HCL 0.1 MG PO TABS
0.1000 mg | ORAL_TABLET | ORAL | Status: DC | PRN
  Administered 2019-01-05 – 2019-01-06 (×2): 0.1 mg via ORAL
  Filled 2019-01-05 (×2): qty 1

## 2019-01-05 NOTE — Plan of Care (Signed)
  Problem: RH BOWEL ELIMINATION Goal: RH STG MANAGE BOWEL W/MEDICATION W/ASSISTANCE Description STG Manage Bowel with Medication with mod I Assistance.  Outcome: Not Progressing; LBM 3/24; pt has senna on his list; MD made aware

## 2019-01-05 NOTE — Progress Notes (Signed)
Pt's bp 163/103 am meds given bp recheck 158/101; MD made aware.

## 2019-01-05 NOTE — Evaluation (Signed)
Speech Language Pathology Assessment and Plan  Patient Details  Name: Patrick Pollard MRN: [id number] Date of Birth: May 03, 1946  SLP Diagnosis: Cognitive Impairments  Rehab Potential: Good ELOS: 3 to 4 weeks    Today's Date: 01/05/2019 SLP Individual Time: 0355-9741 SLP Individual Time Calculation (min): 60 min   Problem List:  Patient Active Problem List   Diagnosis Date Noted  . Hypertensive emergency 01/04/2019  . Hyperlipemia 01/04/2019  . BPH (benign prostatic hyperplasia) 01/04/2019  . Thyroid mass, left 01/04/2019  . Left-sided intracerebral hemorrhage ([place]) 01/04/2019  . 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 Chronic post-traumatic stress disorder (PTSD) 12/25/2018  . Major depressive disorder, recurrent episode, moderate ([place]) 12/04/2018  . GIB (gastrointestinal bleeding) 12/31/2017   Past Medical History:  Past Medical History:  Diagnosis Date  . Anxiety   . Colon polyps    s/p colonoscopy 12/2016 wtih polyp removal   . Depression    Past Surgical History: History reviewed. No pertinent surgical history.  Assessment / Plan / Recommendation Clinical Impression Patrick Pollard is a 73 year old right-handed male with past history of GI bleed due to polyp status post resection 2018, hypertension maintained on Maxide, depression continues with Abilify, Effexor as well as Wellbutrin.per chart review patient lives with spouse. One level home with ramped entrance. Independent prior to admission. Presented 01/01/2019 with acute onset of left-sided weakness, mild dysarthria and facial droop while up to the bathroom. Blood pressure 638 systolic upon arrival to the ED. Denied any chest pain or shortness of breath. Cranial CT scan showed a 3.1 x 2.6 x 2.3 cm acute hemorrhage in the right lateral thalamus/posterior limb internal capsule mild surrounding edema. 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  Placed on Cardene for blood pressure  control. Tolerating a regular diet. Therapy evaluations completed with recommendations of physical medicine rehabilitation consult.   Patient was admitted for a comprehensive rehabilitation program on 01/05/19. Comprehensive cognitive linguistic evaluation completed on 01/05/19. Pt presents with mild cognitive deficits when completing higher level cognitive tasks. Deficits noted in executive function, mental manipulation, abstract reasoning and delayed recall. Pt's speech intelligibility was 100% at complex conversaiton level as his dysarthria has drastically improved. Pt's cognitive function is further impacted by self-directed verbosity. Skilled ST is required to target the above mentioned deficits, increase functional independence and reduce caregiver burden.    Skilled Therapeutic Interventions          Skilled treatment session focused on completion of above mentioned cognitive linguistic evaluation. Session moderately limited in opportunities for therapeutic interventions d/t pt's verbosity. Would recommend formal assessment such as the Cognitive Linguistic Quick Test to further assess executive function.    SLP Assessment  Patient will need skilled [facility] Services during CIR admission    Recommendations  Recommendations for Other Services: Neuropsych consult Patient destination: Home Follow up Recommendations: 24 hour supervision/assistance;Home Health SLP Equipment Recommended: None recommended by SLP    SLP Frequency 3 to 5 out of 7 days   SLP Duration  SLP Intensity  SLP Treatment/Interventions 3 to 4 weeks  Minumum of 1-2 x/day, 30 to 90 minutes  Cognitive remediation/compensation;Medication managment;Functional tasks;Patient/family education;Therapeutic Activities    Pain Pain Assessment Pain Scale: 0-10 Pain Score: 0-No pain  Prior Functioning Cognitive/Linguistic Baseline: Within functional limits Type of Home: House  Lives With:  Spouse;Daughter Available Help at Discharge: Family;Available 24 hours/day Vocation: Retired  Industrial/product designer Term Goals: Week 1: SLP Short Term Goal 1 (Week 1): Pt will  complete semi-complex money and medication management tasks with supervision cues.  SLP Short Term Goal 2 (Week 1): Pt will complete semi-complex reasoning tasks with Min A cues.  SLP Short Term Goal 3 (Week 1): Pt will demonstrate selective attention in moderately distracting environment for ~ 30 minutes with Min A cues.  SLP Short Term Goal 4 (Week 1): Pt will improve recall of complex information after lapsed time in 8 out fo 10 opportunities with Min A cues.   Refer to Care Plan for Long Term Goals  Recommendations for other services: Neuropsych  Discharge Criteria: Patient will be discharged from SLP if patient refuses treatment 3 consecutive times without medical reason, if treatment goals not met, if there is a change in medical status, if patient makes no progress towards goals or if patient is discharged from hospital.  The above assessment, treatment plan, treatment alternatives and goals were discussed and mutually agreed upon: by patient  Robyne Matar 01/05/2019, 10:46 AM

## 2019-01-05 NOTE — Evaluation (Signed)
Occupational Therapy Assessment and Plan  Patient Details  Name: Bretton Tandy MRN: [id number] Date of Birth: 1946-08-22  OT Diagnosis: abnormal posture, apraxia, cognitive deficits, flaccid hemiplegia and hemiparesis, hemiplegia affecting non-dominant side and muscle weakness (generalized) Rehab Potential:   ELOS: 25-28   Today's Date: 01/05/2019 OT Individual Time: 5974-1638 OT Individual Time Calculation (min): 75 min     Problem List:  Patient Active Problem List   Diagnosis Date Noted  . Hypertensive emergency 01/04/2019  . Hyperlipemia 01/04/2019  . BPH (benign prostatic hyperplasia) 01/04/2019  . Thyroid mass, left 01/04/2019  . Left-sided intracerebral hemorrhage ([place]) 01/04/2019  . 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 Chronic post-traumatic stress disorder (PTSD) 12/25/2018  . Major depressive disorder, recurrent episode, moderate ([place]) 12/04/2018  . GIB (gastrointestinal bleeding) 12/31/2017    Past Medical History:  Past Medical History:  Diagnosis Date  . Anxiety   . Colon polyps    s/p colonoscopy 12/2016 wtih polyp removal   . Depression    Past Surgical History: History reviewed. No pertinent surgical history.  Assessment & Plan Clinical Impression: Chi Garlow is a 73 year old right-handed male with past history of GI bleed due to polyp status post resection 2018, hypertension maintained on Maxide, depression continues with Abilify, Effexor as well as Wellbutrin.per chart review patient lives with spouse. One level home with ramped entrance. Independent prior to admission. Presented 01/01/2019 with acute onset of left-sided weakness, mild dysarthria and facial droop while up to the bathroom. Blood pressure 453 systolic upon arrival to the ED. Denied any chest pain or shortness of breath. Cranial CT scan showed a 3.1 x 2.6 x 2.3 cm acute hemorrhage in the right lateral thalamus/posterior limb internal capsule mild  surrounding edema. CT angiogram of head and neck negative for vascular malformation. 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 Echocardiogram with ejection fraction of 60%. Systolic function normal. Placed on Cardene for blood pressure control. Tolerating a regular diet. Therapy evaluations completed with recommendations of physical medicine rehabilitation consult. Patient was admitted for a comprehensive rehabilitation program.   Patient currently requires max- total with basic self-care skills secondary to muscle weakness, decreased cardiorespiratoy endurance, abnormal tone, unbalanced muscle activation, motor apraxia, decreased coordination and decreased motor planning, decreased midline orientation and decreased attention to left, decreased attention and decreased problem solving and decreased sitting balance, decreased standing balance, decreased postural control, hemiplegia and decreased balance strategies.  Prior to hospitalization, patient could complete BADL with independent .  Patient will benefit from skilled intervention to decrease level of assist with basic self-care skills and increase independence with basic self-care skills prior to discharge home with care partner.  Anticipate patient will require 24 hour supervision and minimal physical assistance and follow up home health.  OT - End of Session Activity Tolerance: Tolerates 30+ min activity with multiple rests Endurance Deficit: Yes OT Assessment Rehab Potential (ACUTE ONLY): Fair OT Barriers to Discharge: Decreased caregiver support OT Barriers to Discharge Comments: wife unable to provide physical A, however pt reporting daughter able to provided assistance at D/c OT Patient demonstrates impairments in the following area(s): Balance;Cognition;Endurance;Motor;Nutrition;Pain;Perception;Safety;Sensory OT Basic ADL's Functional Problem(s): Grooming;Bathing;Dressing;Toileting;Eating OT Transfers Functional Problem(s):  Toilet;Tub/Shower OT Additional Impairment(s): Fuctional Use of Upper Extremity OT Plan OT Intensity: Minimum of 1-2 x/day, 45 to 90 minutes OT Frequency: 5 out of 7 days OT Duration/Estimated Length of Stay: 25-28 OT Treatment/Interventions: Balance/vestibular training;Community reintegration;Disease mangement/prevention;Functional electrical stimulation;Neuromuscular re-education;Patient/family education;Splinting/orthotics;Self Care/advanced ADL retraining;Therapeutic Exercise;UE/LE Coordination activities;Wheelchair propulsion/positioning;Cognitive  remediation/compensation;DME/adaptive equipment instruction;Discharge planning;Functional mobility training;Pain management;Psychosocial support;Skin care/wound managment;Therapeutic Activities;UE/LE Strength taining/ROM;Visual/perceptual remediation/compensation OT Self Feeding Anticipated Outcome(s): S OT Basic Self-Care Anticipated Outcome(s): MIN A OT Toileting Anticipated Outcome(s): MIN A OT Bathroom Transfers Anticipated Outcome(s): MIN A OT Recommendation Patient destination: Home Follow Up Recommendations: Home health OT Equipment Recommended: 3 in 1 bedside comode;Tub/shower seat   Skilled Therapeutic Intervention 1;1. Pt received in bed with no c/o pain. Pt educated on role/purpose of OT, CIR, ELOS, POC and stroke recovery. Pt verbose throughout session requiring VC for redirection and attention to ADL tasks. Pt able to wash B thighs seated in chair positioning in bed, OT washes B feet and lower legs, dons teds and non skid socks. OT threads BLE into pants, and pt able to roll B (MOD A to L and total A to R) with VC for use of bed rails and OT advances pants past hips. Pt washes UB with HOH A for LUE to wash RUE and VC for pt to visually attend to L hand during process. Pt dons shirt with MAX A and OT coaches pt through hemi techniques. Supine>sitting EOB with MAX A. Pt requires MAX A for static sitting balance. Pt reporting nausea at EOB  and OT returns pt to supine  To check vitals. BP 163/101. RN alerted and teds doffed. Exited session with pt seated in bed in supine, call light in reach and all needs met  OT Evaluation Precautions/Restrictions  Precautions Precautions: Fall Precaution Comments: L hemi, SbP <160 Restrictions Weight Bearing Restrictions: No General Chart Reviewed: Yes Family/Caregiver Present: No Vital Signs Therapy Vitals Temp: 97.8 F (36.6 C) Temp Source: Oral Pulse Rate: 74 Resp: 15 BP: (!) 163/105 Patient Position (if appropriate): Lying Oxygen Therapy SpO2: 92 % O2 Device: Room Air Pain Pain Assessment Pain Scale: 0-10 Pain Score: 0-No pain Pain Type: Acute pain Pain Location: Neck Pain Orientation: Left;Anterior Pain Descriptors / Indicators: Discomfort Pain Frequency: Intermittent Pain Onset: With Activity Patients Stated Pain Goal: 2 Pain Intervention(s): Medication (See eMAR) Multiple Pain Sites: No Home Living/Prior Functioning Home Living Family/patient expects to be discharged to:: Private residence Living Arrangements: Spouse/significant other, Children Available Help at Discharge: Family, Available 24 hours/day Type of Home: House Home Access: Ramped entrance Home Layout: One level Bathroom Shower/Tub: Gaffer, Door Bathroom Toilet: Handicapped height Bathroom Accessibility: Yes Additional Comments: has a shower seat and grab bars  Lives With: Spouse, Daughter IADL History Homemaking Responsibilities: Yes Meal Prep Responsibility: Building surveyor Responsibility: Secondary Cleaning Responsibility: Secondary Homemaking Comments: wife has severe back problems daughter likely to be able to provide hands on A Current License: Yes Mode of Transportation: Car Leisure and Hobbies: cutting and shaping precious stones Prior Function Level of Independence: Independent with basic ADLs, Independent with homemaking with ambulation, Independent with gait Vocation:  Retired(ship captain) Comments: active  ADL   Vision Baseline Vision/History: No visual deficits Patient Visual Report: No change from baseline Vision Assessment?: Vision impaired- to be further tested in functional context Perception  Perception: Impaired Comments: mild L inattention Praxis Praxis: Impaired Praxis Impairment Details: Motor planning Cognition Overall Cognitive Status: Impaired/Different from baseline Arousal/Alertness: Awake/alert Orientation Level: Person;Place;Situation Person: Oriented Place: Oriented Situation: Oriented Year: 2020 Month: March Day of Week: Correct Memory: Appears intact Immediate Memory Recall: Sock;Blue;Bed Memory Recall: Bed;Sock;Blue Memory Recall Sock: Without Cue Memory Recall Blue: Without Cue Memory Recall Bed: Without Cue Attention: Focused;Sustained Focused Attention: Appears intact Sustained Attention: Appears intact Awareness: Appears intact Safety/Judgment: Appears intact Sensation Sensation Light Touch:  Impaired by gross assessment Light Touch Impaired Details: Impaired LLE;Impaired LUE Coordination Gross Motor Movements are Fluid and Coordinated: No Fine Motor Movements are Fluid and Coordinated: No Motor  Motor Motor: Hemiplegia Motor - Skilled Clinical Observations: dense L hemi Mobility  Bed Mobility Bed Mobility: Rolling Left;Rolling Right;Left Sidelying to Sit Rolling Right: Total Assistance - Patient < 25% Rolling Left: Minimal Assistance - Patient > 75% Left Sidelying to Sit: Maximal Assistance - Patient 25-49%  Trunk/Postural Assessment  Cervical Assessment Cervical Assessment: Exceptions to WFL(head forward) Thoracic Assessment Thoracic Assessment: Exceptions to WFL(rounded shoudler) Lumbar Assessment Lumbar Assessment: Exceptions to WFL(posterior pelic tilt) Postural Control Postural Control: Deficits on evaluation(insufficient)  Balance Balance Balance Assessed: Yes Static Sitting  Balance Static Sitting - Balance Support: Feet supported Static Sitting - Level of Assistance: 2: Max assist Dynamic Sitting Balance Sitting balance - Comments: pt with strong L lateral lean, pushing with R UE, able to minimally and briefly correct to midline with cueing Extremity/Trunk Assessment RUE Assessment RUE Assessment: Within Functional Limits LUE Assessment LUE Assessment: Exceptions to [facility] LUE Body System: Neuro Brunstrum levels for arm and hand: Arm;Hand Brunstrum level for arm: Stage I Presynergy Brunstrum level for hand: Stage I Flaccidity     Refer to Care Plan for Long Term Goals  Recommendations for other services: None    Discharge Criteria: Patient will be discharged from OT if patient refuses treatment 3 consecutive times without medical reason, if treatment goals not met, if there is a change in medical status, if patient makes no progress towards goals or if patient is discharged from hospital.  The above assessment, treatment plan, treatment alternatives and goals were discussed and mutually agreed upon: by patient  Tonny Branch 01/05/2019, 7:28 AM

## 2019-01-05 NOTE — Progress Notes (Signed)
MD notified of patient's bp 164/104; sitting 157/134. Continued to monitor.

## 2019-01-05 NOTE — Progress Notes (Signed)
[place] PHYSICAL MEDICINE & REHABILITATION PROGRESS NOTE   Subjective/Complaints:  No issues overnite, foley removed but has not voided yet, poor appetite but ate this am  ROS- denies CP, SOB, N/D/D, + constipation   Objective:   No results found. Recent Labs    01/03/19 0420 01/04/19 0315  WBC 10.1 11.1*  HGB 16.3 16.6  HCT 47.5 50.0  PLT 282 301   Recent Labs    01/03/19 0420 01/04/19 0315  NA 134* 132*  K 3.6 3.5  CL 99 95*  CO2 24 23  GLUCOSE 166* 157*  BUN 9 14  CREATININE 0.63 0.76  CALCIUM 9.4 9.5    Intake/Output Summary (Last 24 hours) at 01/05/2019 1124 Last data filed at 01/05/2019 0655 Gross per 24 hour  Intake 120 ml  Output 625 ml  Net -505 ml     Physical Exam: Vital Signs Blood pressure (!) 163/103, pulse 69, temperature 97.8 F (36.6 C), temperature source Oral, resp. rate 15, height 6\' 1"  (1.854 m), weight 95.8 kg, SpO2 92 %.     Assessment/Plan: 1. 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 with left HP which require 3+ hours per day of interdisciplinary therapy in a comprehensive inpatient rehab setting.  Physiatrist is providing close team supervision and 24 hour management of active medical problems listed below.  Physiatrist and rehab team continue to assess barriers to discharge/monitor patient progress toward functional and medical goals  Care Tool:  Bathing              Bathing assist       Upper Body Dressing/Undressing Upper body dressing        Upper body assist      Lower Body Dressing/Undressing Lower body dressing            Lower body assist       Toileting Toileting    Toileting assist Assist for toileting: Minimal Assistance - Patient > 75%     Transfers Chair/bed transfer  Transfers assist     Chair/bed transfer assist level: Moderate Assistance - Patient 50 - 74%     Locomotion Ambulation   Ambulation assist              Walk 10 feet  activity   Assist           Walk 50 feet activity   Assist           Walk 150 feet activity   Assist           Walk 10 feet on uneven surface  activity   Assist           Wheelchair     Assist               Wheelchair 50 feet with 2 turns activity    Assist            Wheelchair 150 feet activity     Assist          Medical Problem List and Plan: 1.Left side weakness with facial droopas well as left hemi-sensory losssecondary to right thalamic hemorrhage with IVH secondary to hypertensive crisis -CIR evals PT, OT, SLP -left WHO and PRAFO ordered 2. Antithrombotics: -DVT/anticoagulation:SCDs -antiplatelet therapy: N/A 3. Pain Management:Tylenol as needed 4. Mood:Abilify 5 mg daily, Effexor 300 mg daily. Provide emotional support -antipsychotic agents: N/A 5. Neuropsych: This patientiscapable of making decisions on hisown behalf. 6. Skin/Wound Care:Routine skin checks 7. Fluids/Electrolytes/Nutrition:Routine ins and outs with  follow-up chemistries 8. Lisinopril 20 mg daily, Maxide 37.5-25 mg daily. Monitor with increased mobility Vitals:   01/05/19 0439 01/05/19 0823  BP: (!) 163/105 (!) 163/103  Pulse: 74 69  Resp: 15   Temp: 97.8 F (36.6 C)   SpO2: 92%   given ICH will have goal of sys <160, will add amlodipine low dose 9. BPH. Flomax 0.4 mg daily., removed  Foley has not voided yet 10. History of GI bleed . Continue Protonix -pt reports persistent nausea and is interested in bringing a supplement in from home which works for him. I told him we could review herb if/when it is brought in             -in the mean time may use zofran po disintegrating tab 11. Hyperlipidemia. Resume Lipitor on discharge.   12.  Constipation- received senna today  LOS: 1 days A FACE TO FACE EVALUATION WAS PERFORMED  Erick Colace 01/05/2019, 11:24 AM

## 2019-01-05 NOTE — Progress Notes (Signed)
Orthopedic Tech Progress Note Patient Details:  Patrick Pollard Oct 21, 1945 [id number] Called in order HANGER Patient ID: Patrick Pollard, male   DOB: Mar 31, 1946, 73 y.o.   MRN: [id number]   [name] 01/05/2019, 8:32 AM

## 2019-01-05 NOTE — Progress Notes (Signed)
MD notified of bp 169/107. MD said to wait for the next dose of prn clonidine, recheck bp and give dose if indicated. Continued to monitor.

## 2019-01-05 NOTE — Evaluation (Signed)
Physical Therapy Assessment and Plan  Patient Details  Name: Patrick Pollard MRN: [id number] Date of Birth: Jul 03, 1946  PT Diagnosis: Abnormal posture, Abnormality of gait, Hemiparesis non-dominant, Impaired cognition, Impaired sensation and Muscle weakness Rehab Potential: Fair ELOS: 3-4 weeks    Today's Date: 01/05/2019 PT Individual Time: 0093-8182 PT Individual Time Calculation (min): 55 mind    Problem List:  Patient Active Problem List   Diagnosis Date Noted  . Hypertensive emergency 01/04/2019  . Hyperlipemia 01/04/2019  . BPH (benign prostatic hyperplasia) 01/04/2019  . Thyroid mass, left 01/04/2019  . Left-sided intracerebral hemorrhage ([place]) 01/04/2019  . 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 Chronic post-traumatic stress disorder (PTSD) 12/25/2018  . Major depressive disorder, recurrent episode, moderate ([place]) 12/04/2018  . GIB (gastrointestinal bleeding) 12/31/2017    Past Medical History:  Past Medical History:  Diagnosis Date  . Anxiety   . Colon polyps    s/p colonoscopy 12/2016 wtih polyp removal   . Depression    Past Surgical History: History reviewed. No pertinent surgical history.  Assessment & Plan Clinical Impression: Patient is a 73 year old right-handed male with past history of GI bleed due to polyp status post resection 2018, hypertension maintained on Maxide, depression continues with Abilify, Effexor as well as Wellbutrin.per chart review patient lives with spouse. One level home with ramped entrance. Independent prior to admission. Presented 01/01/2019 with acute onset of left-sided weakness, mild dysarthria and facial droop while up to the bathroom. Blood pressure 993 systolic upon arrival to the ED. Denied any chest pain or shortness of breath. Cranial CT scan showed a 3.1 x 2.6 x 2.3 cm acute hemorrhage in the right lateral thalamus/posterior limb internal capsule mild surrounding edema. CT angiogram of head and neck  negative for vascular malformation. 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 Echocardiogram with ejection fraction of 60%. Systolic function normal. Placed on Cardene for blood pressure control. Tolerating a regular diet.   Patient transferred to CIR on 01/04/2019 .   Patient currently requires total with mobility secondary to muscle weakness, muscle joint tightness and muscle paralysis, decreased cardiorespiratoy endurance, impaired timing and sequencing and abnormal tone, decreased visual acuity, decreased midline orientation and decreased attention to left, decreased attention, decreased awareness, decreased problem solving, decreased safety awareness, decreased memory and delayed processing and decreased sitting balance, decreased standing balance, decreased postural control, hemiplegia, decreased balance strategies and difficulty maintaining precautions.  Prior to hospitalization, patient was independent  with mobility and lived with Spouse, Daughter in a House home.  Home access is  Ramped entrance.  Patient will benefit from skilled PT intervention to maximize safe functional mobility, minimize fall risk and decrease caregiver burden for planned discharge home with 24 hour assist.  Anticipate patient will benefit from follow up [facility] at discharge.  PT - End of Session Activity Tolerance: Tolerates < 10 min activity with changes in vital signs Endurance Deficit: Yes PT Assessment Rehab Potential (ACUTE/IP ONLY): Fair PT Barriers to Discharge: Medical stability;Home environment access/layout;Insurance for SNF coverage PT Patient demonstrates impairments in the following area(s): Balance;Behavior;Edema;Endurance;Motor;Nutrition;Perception;Sensory;Safety;Pain;Skin Integrity PT Transfers Functional Problem(s): Bed Mobility;Bed to Chair;Car;Furniture;Floor PT Locomotion Functional Problem(s): Ambulation;Wheelchair Mobility;Stairs PT Plan PT Intensity: Minimum of 1-2 x/day ,45 to 90 minutes PT  Frequency: 5 out of 7 days PT Duration Estimated Length of Stay: 3-4 weeks  PT Treatment/Interventions: Ambulation/gait training;Balance/vestibular training;Cognitive remediation/compensation;Community reintegration;Discharge planning;Disease management/prevention;DME/adaptive equipment instruction;Functional electrical stimulation;Functional mobility training;Neuromuscular re-education;Patient/family education;Pain management;Psychosocial support;Skin care/wound management;Splinting/orthotics;Stair training;Therapeutic Activities;Therapeutic Exercise;UE/LE Coordination activities;UE/LE  Strength taining/ROM;Visual/perceptual remediation/compensation;Wheelchair propulsion/positioning PT Transfers Anticipated Outcome(s): mod assist  PT Locomotion Anticipated Outcome(s): Supervision assist WC level  PT Recommendation Recommendations for Other Services: Neuropsych consult Follow Up Recommendations: Skilled nursing facility Patient destination: Home Equipment Recommended: Wheelchair (measurements);Wheelchair cushion (measurements)  Skilled Therapeutic Intervention PT instructed patient in PT Evaluation and initiated treatment intervention; see below for results. PT educated patient in [place], rehab potential, rehab goals, and discharge recommendations. Supine>sit with total- max assist. BP assessed in sitting 157/134, HR 89. Pt performed Sit>supine with total assist + 2. Bed mobility with mod assist to the L and max=total assist to the R. Pt  left supine in bed with call bell in reach and all needs met.     PT Evaluation Precautions/Restrictions   fall. Watch BP  General   Vital SignsTherapy Vitals Temp: 99 F (37.2 C) Pulse Rate: 89 Resp: 19 BP: (!) 157/134 Patient Position (if appropriate): Sitting Oxygen Therapy SpO2: 92 % O2 Device: Room Air Pain Pain Assessment Pain Scale: 0-10 Pain Score: 3  Pain Type: Acute pain Pain Location: Head Pain Descriptors / Indicators: Aching Pain  Frequency: Occasional Pain Onset: On-going Pain Intervention(s): Medication (See eMAR) Home Living/Prior Functioning Home Living Available Help at Discharge: Family;Available 24 hours/day Type of Home: House Home Access: Ramped entrance Home Layout: One level Bathroom Shower/Tub: Walk-in shower;Door Bathroom Toilet: Handicapped height Bathroom Accessibility: Yes Additional Comments: has a shower seat and grab bars  Lives With: Spouse;Daughter Prior Function Level of Independence: Independent with basic ADLs;Independent with homemaking with ambulation;Independent with gait Vocation: Retired(ship captain) Comments: active  Vision/Perception     L inattention. Restricted gaze on the L compared to the R.  Cognition Overall Cognitive Status: Impaired/Different from baseline Arousal/Alertness: Awake/alert Orientation Level: Oriented X4 Attention: Selective Selective Attention: Impaired Selective Attention Impairment: Verbal complex;Functional complex Awareness: Appears intact Problem Solving: Impaired Problem Solving Impairment: Verbal complex;Functional complex Executive Function: Reasoning;Organizing;Sequencing Reasoning: Impaired Reasoning Impairment: Verbal complex;Functional complex Sequencing: Impaired Sequencing Impairment: Verbal complex;Functional complex Organizing: Impaired Organizing Impairment: Verbal complex;Functional complex Behaviors: (verbose) Safety/Judgment: Impaired Sensation Sensation Light Touch: Impaired Detail Light Touch Impaired Details: Absent LUE;Absent LLE Hot/Cold: Impaired Detail Hot/Cold Impaired Details: Absent LLE;Absent LUE Proprioception: Impaired Detail Proprioception Impaired Details: Absent LUE;Absent LLE Stereognosis: Impaired Detail Stereognosis Impaired Details: Absent LUE Coordination Gross Motor Movements are Fluid and Coordinated: No Fine Motor Movements are Fluid and Coordinated: No Coordination and Movement Description:  dense L hemiplegia  Motor  Motor Motor: Hemiplegia Motor - Skilled Clinical Observations: dense L hemi UE=LE  Mobility Bed Mobility Bed Mobility: Rolling Left;Rolling Right;Left Sidelying to Sit;Sit to Supine;Supine to Sit Rolling Right: Maximal Assistance - Patient 25-49% Rolling Left: Moderate Assistance - Patient 50-74% Left Sidelying to Sit: Maximal Assistance - Patient 25-49% Supine to Sit: Total Assistance - Patient < 25% Sit to Supine: 2 Helpers Transfers Transfers: Sit to Stand Sit to Stand: (unsafe increased BP) Locomotion  Gait Ambulation: No Stairs / Additional Locomotion Stairs: No Wheelchair Mobility Wheelchair Mobility: No  Trunk/Postural Assessment  Cervical Assessment Cervical Assessment: Exceptions to WFL(head forward) Thoracic Assessment Thoracic Assessment: Exceptions to WFL(rounded shoulder. L lateral lean) Lumbar Assessment Lumbar Assessment: Exceptions to WFL(posterior pelic tilt) Postural Control Postural Control: Deficits on evaluation(insufficient/absent on the L )  Balance Balance Balance Assessed: Yes Static Sitting Balance Static Sitting - Balance Support: Feet supported Static Sitting - Level of Assistance: 2: Max assist Dynamic Sitting Balance Sitting balance - Comments: pt with strong L lateral lean, pushing with R UE, able to minimally and  briefly correct to midline with cueing Extremity Assessment  RUE Assessment RUE Assessment: Within Functional Limits   RLE Assessment RLE Assessment: Within Functional Limits LLE Assessment LLE Assessment: Exceptions to [facility] General Strength Comments: trace extension in synergy pattern in the LLE.    Refer to Care Plan for Long Term Goals  Recommendations for other services: Neuropsych  Discharge Criteria: Patient will be discharged from PT if patient refuses treatment 3 consecutive times without medical reason, if treatment goals not met, if there is a change in medical status, if patient makes  no progress towards goals or if patient is discharged from hospital.  The above assessment, treatment plan, treatment alternatives and goals were discussed and mutually agreed upon: by patient  Lorie Phenix 01/05/2019, 4:40 PM

## 2019-01-06 ENCOUNTER — Inpatient Hospital Stay (HOSPITAL_COMMUNITY): Payer: Self-pay | Admitting: Physical Therapy

## 2019-01-06 ENCOUNTER — Inpatient Hospital Stay (HOSPITAL_COMMUNITY): Payer: Self-pay | Admitting: Speech Pathology

## 2019-01-06 ENCOUNTER — Inpatient Hospital Stay (HOSPITAL_COMMUNITY): Payer: Self-pay

## 2019-01-06 MED ORDER — AMLODIPINE BESYLATE 5 MG PO TABS
5.0000 mg | ORAL_TABLET | Freq: Every day | ORAL | Status: DC
  Administered 2019-01-06 – 2019-02-07 (×33): 5 mg via ORAL
  Filled 2019-01-06 (×33): qty 1

## 2019-01-06 MED ORDER — BISACODYL 10 MG RE SUPP
10.0000 mg | Freq: Every day | RECTAL | Status: DC | PRN
  Administered 2019-01-07 – 2019-01-11 (×2): 10 mg via RECTAL
  Filled 2019-01-06 (×2): qty 1

## 2019-01-06 MED ORDER — ENSURE ENLIVE PO LIQD
237.0000 mL | Freq: Two times a day (BID) | ORAL | Status: DC
  Administered 2019-01-06 – 2019-02-07 (×53): 237 mL via ORAL

## 2019-01-06 MED ORDER — LIDOCAINE HCL URETHRAL/MUCOSAL 2 % EX GEL
1.0000 "application " | CUTANEOUS | Status: DC | PRN
  Filled 2019-01-06 (×7): qty 5

## 2019-01-06 NOTE — IPOC Note (Signed)
Overall Plan of Care [facility]) Patient Details Name: Patrick Pollard MRN: [id number] DOB: Oct 30, 1945  Admitting Diagnosis: <principal problem not specified>  Hospital Problems: Active Problems:   Left-sided intracerebral hemorrhage (HCC)     Functional Problem List: Nursing Safety, Motor, Endurance  PT Balance, Behavior, Edema, Endurance, Motor, Nutrition, Perception, Sensory, Safety, Pain, Skin Integrity  OT Balance, Cognition, Endurance, Motor, Nutrition, Pain, Perception, Safety, Sensory  SLP Cognition  TR         Basic ADL's: OT Grooming, Bathing, Dressing, Toileting, Eating     Advanced  ADL's: OT       Transfers: PT Bed Mobility, Bed to Chair, Car, [employer], Civil Service fast streamer, Research scientist (life sciences): PT Ambulation, Psychologist, prison and probation services, Stairs     Additional Impairments: OT Fuctional Use of Upper Extremity  SLP Social Cognition   Problem Solving, Attention, Memory  TR      Anticipated Outcomes Item Anticipated Outcome  Self Feeding S  Swallowing      Basic self-care  MIN A  Toileting  MIN A   Bathroom Transfers MIN A  Bowel/Bladder  Mod I assist  Transfers  mod assist   Locomotion  Supervision assist WC level   Communication     Cognition  Supervision for complex  Pain  < 3  Safety/Judgment  Supervision   Therapy Plan: PT Intensity: Minimum of 1-2 x/day ,45 to 90 minutes PT Frequency: 5 out of 7 days PT Duration Estimated Length of Stay: 3-4 weeks  OT Intensity: Minimum of 1-2 x/day, 45 to 90 minutes OT Frequency: 5 out of 7 days OT Duration/Estimated Length of Stay: 25-28 SLP Intensity: Minumum of 1-2 x/day, 30 to 90 minutes SLP Frequency: 3 to 5 out of 7 days SLP Duration/Estimated Length of Stay: 3 to 4 weeks    Team Interventions: Nursing Interventions Patient/Family Education, Medication Management, Disease Management/Prevention  PT interventions Ambulation/gait training, Warden/ranger, Cognitive  remediation/compensation, Community reintegration, Discharge planning, Disease management/prevention, DME/adaptive equipment instruction, Functional electrical stimulation, Functional mobility training, Neuromuscular re-education, Patient/family education, Pain management, Psychosocial support, Skin care/wound management, Splinting/orthotics, Stair training, Therapeutic Activities, Therapeutic Exercise, UE/LE Coordination activities, UE/LE Strength taining/ROM, Visual/perceptual remediation/compensation, Wheelchair propulsion/positioning  OT Interventions Warden/ranger, Community reintegration, Disease mangement/prevention, Development worker, international aid stimulation, Neuromuscular re-education, Patient/family education, Splinting/orthotics, Self Care/advanced ADL retraining, Therapeutic Exercise, UE/LE Coordination activities, Wheelchair propulsion/positioning, Cognitive remediation/compensation, DME/adaptive equipment instruction, Discharge planning, Functional mobility training, Pain management, Psychosocial support, Skin care/wound managment, Therapeutic Activities, UE/LE Strength taining/ROM, Visual/perceptual remediation/compensation  SLP Interventions Cognitive remediation/compensation, Medication managment, Functional tasks, Patient/family education, Therapeutic Activities  TR Interventions    SW/CM Interventions     Barriers to Discharge MD  Medical stability and Home enviroment access/loayout  Nursing      PT Medical stability, Home environment access/layout, Insurance for SNF coverage    OT Decreased caregiver support wife unable to provide physical A, however pt reporting daughter able to provided assistance at D/c  SLP      SW       Team Discharge Planning: Destination: PT-Home ,OT- Home , SLP-Home Projected Follow-up: PT-Skilled nursing facility, OT-  Home health OT, SLP-24 hour supervision/assistance, Home Health SLP Projected Equipment Needs: PT-Wheelchair (measurements),  Wheelchair cushion (measurements), OT- 3 in 1 bedside comode, Tub/shower seat, SLP-None recommended by SLP Equipment Details: PT- , OT-  Patient/family involved in discharge planning: PT- Patient,  OT-Patient, SLP-Patient  MD ELOS: 14-20d Medical Rehab Prognosis:  Good Assessment:  73 year old right-handed male with past history of GI bleed due  to polyp status post resection 2018, hypertension maintained on Maxide, depression continues with Abilify, Effexor as well as Wellbutrin.per chart review patient lives with spouse. One level home with ramped entrance. Independent prior to admission. Presented 01/01/2019 with acute onset of left-sided weakness, mild dysarthria and facial droop while up to the bathroom. Blood pressure 155 systolic upon arrival to the ED. Denied any chest pain or shortness of breath. Cranial CT scan showed a 3.1 x 2.6 x 2.3 cm acute hemorrhage in the right lateral thalamus/posterior limb internal capsule mild surrounding edema. CT angiogram of head and neck negative for vascular malformation. 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 Echocardiogram with ejection fraction of 60%. Systolic function normal. Placed on Cardene for blood pressure control  Now requiring 24/7 Rehab RN,MD, as well as CIR level PT, OT and SLP.  Treatment team will focus on ADLs and mobility with goals set at [facility]  See Team Conference Notes for weekly updates to the plan of care

## 2019-01-06 NOTE — Progress Notes (Signed)
Patient's BP 164/95, prn clonidine given. Will monitor.

## 2019-01-06 NOTE — Plan of Care (Signed)
  Problem: RH BOWEL ELIMINATION Goal: RH STG MANAGE BOWEL W/MEDICATION W/ASSISTANCE Description STG Manage Bowel with Medication with mod I Assistance.  Outcome: Not Progressing; constipation laxatives given; pt had no appetite

## 2019-01-06 NOTE — Progress Notes (Signed)
[facility] PHYSICAL MEDICINE & REHABILITATION PROGRESS NOTE   Subjective/Complaints:  Poor appetite states he has not had BM since admit, thinks he could drink some ensure  ROS- denies CP, SOB, N/D/D, + constipation   Objective:   No results found. Recent Labs    01/04/19 0315  WBC 11.1*  HGB 16.6  HCT 50.0  PLT 301   Recent Labs    01/04/19 0315  NA 132*  K 3.5  CL 95*  CO2 23  GLUCOSE 157*  BUN 14  CREATININE 0.76  CALCIUM 9.5    Intake/Output Summary (Last 24 hours) at 01/06/2019 1037 Last data filed at 01/06/2019 0354 Gross per 24 hour  Intake 120 ml  Output 2200 ml  Net -2080 ml     Physical Exam: Vital Signs Blood pressure (!) 144/100, pulse 77, temperature 97.6 F (36.4 C), temperature source Oral, resp. rate 18, height 6\' 1"  (1.854 m), weight 95.8 kg, SpO2 92 %.     Assessment/Plan: 1. 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 with left HP which require 3+ hours per day of interdisciplinary therapy in a comprehensive inpatient rehab setting.  Physiatrist is providing close team supervision and 24 hour management of active medical problems listed below.  Physiatrist and rehab team continue to assess barriers to discharge/monitor patient progress toward functional and medical goals  Care Tool:  Bathing  Bathing activity did not occur: Safety/medical concerns           Bathing assist       Upper Body Dressing/Undressing Upper body dressing   What is the patient wearing?: Pull over shirt    Upper body assist Assist Level: Maximal Assistance - Patient 25 - 49%    Lower Body Dressing/Undressing Lower body dressing      What is the patient wearing?: Incontinence brief     Lower body assist Assist for lower body dressing: Moderate Assistance - Patient 50 - 74%     Toileting Toileting Toileting Activity did not occur Press photographer and hygiene only): Safety/medical concerns  Toileting assist Assist for toileting:  2 Helpers(in and out cath)     Transfers Chair/bed transfer  Transfers assist  Chair/bed transfer activity did not occur: Safety/medical concerns  Chair/bed transfer assist level: Moderate Assistance - Patient 50 - 74%     Locomotion Ambulation   Ambulation assist   Ambulation activity did not occur: Safety/medical concerns          Walk 10 feet activity   Assist  Walk 10 feet activity did not occur: Safety/medical concerns        Walk 50 feet activity   Assist Walk 50 feet with 2 turns activity did not occur: Safety/medical concerns         Walk 150 feet activity   Assist Walk 150 feet activity did not occur: Safety/medical concerns         Walk 10 feet on uneven surface  activity   Assist Walk 10 feet on uneven surfaces activity did not occur: Safety/medical concerns         Wheelchair     Assist     Wheelchair activity did not occur: Safety/medical concerns         Wheelchair 50 feet with 2 turns activity    Assist    Wheelchair 50 feet with 2 turns activity did not occur: Safety/medical concerns       Wheelchair 150 feet activity     Assist Wheelchair 150 feet activity did not  occur: Safety/medical concerns        Medical Problem List and Plan: 1.Left side weakness with facial droopas well as left hemi-sensory losssecondary to right thalamic hemorrhage with IVH secondary to hypertensive crisis -CIR evals PT, OT, SLP -left WHO and PRAFO ordered 2. Antithrombotics: -DVT/anticoagulation:SCDs -antiplatelet therapy: N/A 3. Pain Management:Tylenol as needed 4. Mood:Abilify 5 mg daily, Effexor 300 mg daily. Provide emotional support -antipsychotic agents: N/A 5. Neuropsych: This patientiscapable of making decisions on hisown behalf. 6. Skin/Wound Care:Routine skin checks 7. Fluids/Electrolytes/Nutrition:Routine ins and outs with follow-up  chemistries 8. Lisinopril 20 mg daily, Maxide 37.5-25 mg daily. Monitor with increased mobility Vitals:   01/06/19 0629 01/06/19 0737  BP: (!) 157/99 (!) 144/100  Pulse: 72 77  Resp:    Temp:    SpO2:  92%  given ICH will have goal of sys <160, amlodipine increased to 5mg  would wait a couple days before any other adjustment to achieve steady state 9. BPH. Flomax 0.4 mg daily., removed  Foley  10. History of GI bleed . Continue Protonix -pt reports persistent nausea and is interested in bringing a supplement in from home which works for him. I told him we could review herb if/when it is brought in             -in the mean time may use zofran po disintegrating tab 11. Hyperlipidemia. Resume Lipitor on discharge.   12.  Constipation- received senna today  LOS: 2 days A FACE TO FACE EVALUATION WAS PERFORMED  Erick Colace 01/06/2019, 10:37 AM

## 2019-01-07 ENCOUNTER — Inpatient Hospital Stay (HOSPITAL_COMMUNITY): Payer: Self-pay | Admitting: Occupational Therapy

## 2019-01-07 ENCOUNTER — Inpatient Hospital Stay (HOSPITAL_COMMUNITY): Payer: Self-pay

## 2019-01-07 ENCOUNTER — Inpatient Hospital Stay (HOSPITAL_COMMUNITY): Payer: Self-pay | Admitting: Physical Therapy

## 2019-01-07 DIAGNOSIS — K5901 Slow transit constipation: Secondary | ICD-10-CM

## 2019-01-07 DIAGNOSIS — G479 Sleep disorder, unspecified: Secondary | ICD-10-CM

## 2019-01-07 DIAGNOSIS — I612 Nontraumatic intracerebral hemorrhage in hemisphere, unspecified: Secondary | ICD-10-CM

## 2019-01-07 DIAGNOSIS — R0989 Other specified symptoms and signs involving the circulatory and respiratory systems: Secondary | ICD-10-CM

## 2019-01-07 DIAGNOSIS — I10 Essential (primary) hypertension: Secondary | ICD-10-CM

## 2019-01-07 DIAGNOSIS — R7303 Prediabetes: Secondary | ICD-10-CM

## 2019-01-07 DIAGNOSIS — E871 Hypo-osmolality and hyponatremia: Secondary | ICD-10-CM

## 2019-01-07 LAB — COMPREHENSIVE METABOLIC PANEL
ALK PHOS: 52 U/L (ref 38–126)
ALT: 13 U/L (ref 0–44)
AST: 12 U/L — ABNORMAL LOW (ref 15–41)
Albumin: 4.3 g/dL (ref 3.5–5.0)
Anion gap: 13 (ref 5–15)
BUN: 24 mg/dL — ABNORMAL HIGH (ref 8–23)
CALCIUM: 10 mg/dL (ref 8.9–10.3)
CO2: 25 mmol/L (ref 22–32)
Chloride: 91 mmol/L — ABNORMAL LOW (ref 98–111)
Creatinine, Ser: 0.75 mg/dL (ref 0.61–1.24)
GFR calc Af Amer: >60 mL/min (ref >60)
GFR calc non Af Amer: >60 mL/min (ref >60)
Glucose, Bld: 156 mg/dL — ABNORMAL HIGH (ref 70–99)
Potassium: 4 mmol/L (ref 3.5–5.1)
Sodium: 129 mmol/L — ABNORMAL LOW (ref 135–145)
Total Bilirubin: 0.8 mg/dL (ref 0.3–1.2)
Total Protein: 8 g/dL (ref 6.5–8.1)

## 2019-01-07 LAB — CBC WITH DIFFERENTIAL/PLATELET
ABS IMMATURE GRANULOCYTES: 0.05 10*3/uL (ref 0.00–0.07)
Basophils Absolute: 0 10*3/uL (ref 0.0–0.1)
Basophils Relative: 0 %
Eosinophils Absolute: 0.2 10*3/uL (ref 0.0–0.5)
Eosinophils Relative: 2 %
HCT: 51.3 % (ref 39.0–52.0)
Hemoglobin: 17.7 g/dL — ABNORMAL HIGH (ref 13.0–17.0)
Immature Granulocytes: 1 %
Lymphocytes Relative: 23 %
Lymphs Abs: 2.2 10*3/uL (ref 0.7–4.0)
MCH: 29.9 pg (ref 26.0–34.0)
MCHC: 34.5 g/dL (ref 30.0–36.0)
MCV: 86.7 fL (ref 80.0–100.0)
Monocytes Absolute: 1 10*3/uL (ref 0.1–1.0)
Monocytes Relative: 11 %
Neutro Abs: 6.2 10*3/uL (ref 1.7–7.7)
Neutrophils Relative %: 63 %
Platelets: 342 10*3/uL (ref 150–400)
RBC: 5.92 MIL/uL — ABNORMAL HIGH (ref 4.22–5.81)
RDW: 13.2 % (ref 11.5–15.5)
WBC: 9.7 10*3/uL (ref 4.0–10.5)
nRBC: 0 % (ref 0.0–0.2)

## 2019-01-07 MED ORDER — SENNOSIDES-DOCUSATE SODIUM 8.6-50 MG PO TABS
2.0000 | ORAL_TABLET | Freq: Every day | ORAL | Status: DC
  Administered 2019-01-08 – 2019-02-06 (×23): 2 via ORAL
  Filled 2019-01-07 (×29): qty 2

## 2019-01-07 MED ORDER — TRAZODONE HCL 50 MG PO TABS
50.0000 mg | ORAL_TABLET | Freq: Every day | ORAL | Status: DC
  Administered 2019-01-07 – 2019-01-10 (×4): 50 mg via ORAL
  Filled 2019-01-07 (×4): qty 1

## 2019-01-07 MED ORDER — POLYETHYLENE GLYCOL 3350 17 G PO PACK
17.0000 g | PACK | Freq: Two times a day (BID) | ORAL | Status: DC
  Administered 2019-01-07 – 2019-02-06 (×45): 17 g via ORAL
  Filled 2019-01-07 (×58): qty 1

## 2019-01-07 NOTE — Progress Notes (Signed)
Social Work Assessment and Plan  Patient Details  Name: Patrick Pollard MRN: [id number] Date of Birth: [birth year]  Today's Date: 01/07/2019  Problem List:  Patient Active Problem List   Diagnosis Date Noted  . Sleep disturbance   . Hyponatremia   . Prediabetes   . Slow transit constipation   . Essential hypertension   . Labile blood pressure   . Hypertensive emergency 01/04/2019  . Hyperlipemia 01/04/2019  . BPH (benign prostatic hyperplasia) 01/04/2019  . Thyroid mass, left 01/04/2019  . Left-sided intracerebral hemorrhage ([place]) 01/04/2019  . 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 Chronic post-traumatic stress disorder (PTSD) 12/25/2018  . Major depressive disorder, recurrent episode, moderate ([place]) 12/04/2018  . GIB (gastrointestinal bleeding) 12/31/2017   Past Medical History:  Past Medical History:  Diagnosis Date  . Anxiety   . Colon polyps    s/p colonoscopy 12/2016 wtih polyp removal   . Depression    Past Surgical History: History reviewed. No pertinent surgical history. Social History:  reports that he quit smoking about 22 years ago. His smoking use included cigarettes. He quit after 37.00 years of use. He has never used smokeless tobacco. He reports previous alcohol use. He reports that he does not use drugs.  Family / Support Systems Marital Status: Married How Long?: almost 49 years Patient Roles: Spouse, Parent Spouse/Significant Other: Patrick Pollard - wife - [phone] Children: Patrick Pollard - son - [phone]; Patrick Pollard - dtr - [phone]; another dtr on the [employer] Anticipated Caregiver: wife, son and daughter Ability/Limitations of Caregiver: Pt's wife has limitations due to her back.  She could steady pt for tasks, but will not be able to lift him.  Dtr has some back issues, as well.  Will confirm with her what she can asssit pt with. Caregiver Availability: 24/7 Family Dynamics: Pt reports a very  close, supportive, loyal relationship with his family.  Social History Preferred language: English Religion:  Education: some college Read: Yes Write: Yes Employment Status: Retired Date Retired/Disabled/Unemployed: 2008 Age Retired: 40 Public relations account executive Issues: none reported Guardian/Conservator: N/A - MD has determined that pt is capable of making his own decisions.   Abuse/Neglect Abuse/Neglect Assessment Can Be Completed: Yes Physical Abuse: Denies Verbal Abuse: Denies Sexual Abuse: Denies Exploitation of patient/patient's resources: Denies Self-Neglect: Denies  Emotional Status Pt's affect, behavior and adjustment status: Pt is motivated to work hard so that he does not stay in his current condition.  He wants to get better.  Recent Psychosocial Issues: Pt recently moved to [place] from [place]. Psychiatric History: Pt has a hx of depression which is managed by medications.  He also sees a psychiatrist regularly. Substance Abuse History: none reported  Patient / Family Perceptions, Expectations & Goals Pt/Family understanding of illness & functional limitations: Pt/family have a good understanding of pt's illness and current condition.  Difficult to determine at this point their understanding of physical, hands on care that pt will require at home following CIR d/c. Premorbid pt/family roles/activities: Pt was very independent with driving, gardening, lapidary (polishing stones), working around the house, etc. Anticipated changes in roles/activities/participation: Pt would like to resume activities as he is able. Pt/family expectations/goals: Pt wants to get home and to regain use of his left side.  He'd like to resume lapidary and other activities.  Community Resources [employer]: None Premorbid Home Care/DME Agencies: None Transportation available at discharge: family Resource referrals recommended: Neuropsychology, Support group  (specify)  Discharge Planning Living Arrangements: Spouse/significant other, Children Support Systems: Spouse/significant other, Children Type of Residence: Private residence Insurance Resources: Multimedia programmer (specify)([employer] Medicare) Financial Resources: Social Security Financial Screen Referred: No Money Management: Patient, Spouse Does the patient have any problems obtaining your medications?: No Home Management: Pt, wife, dtr share these responsibilites.  Patient/Family Preliminary Plans: Pt plans to go home with his wife and dtr to assist him 24/7 with min to mod A.  Will need to confirm they can provide this level of care, as if they cannot, pt will need to new d/c plan. Social Work Anticipated Follow Up Needs: HH/OP, Support Group Expected length of stay: 3 to 4 weeks  Clinical Impression CSW met with pt and spoke with his wife via telephone to introduce self and role of CSW, as well as to complete assessment.  Pt was very open with CSW until our visit was interrupted when maintenance work needed to be completed in his room.  CSW then spoke with pt's wife and she was also talkative and informative.  Wife wants regular updates since she can't visit with pt and see his progress.  CSW encouraged her to call the bedside nurse for daily updates and that CSW would keep her informed at least weekly, but that she can call CSW anytime.  She was appreciative.  Pt will likely need to be on CIR for 3 to 4 weeks and will still need hands on assistance afterwards. Pt with a hx of depression, but seems to be coping well and is hopeful for recovery and motivated to work to rehabilitate, but acknowledges it's tiring.  CSW to refer pt for Neuropsychology services for support.  Pt can video chat with his family and has been to stay connected.  CSW will continue to follow and assist as needed.  Deziya Amero, Silvestre Mesi 01/08/2019, 11:00 AM

## 2019-01-07 NOTE — Progress Notes (Signed)
Occupational Therapy Session Note  Patient Details  Name: Patrick Pollard MRN: [id number] Date of Birth: 1946-10-01  Today's Date: 01/07/2019 OT Individual Time: 1230-1330 OT Individual Time Calculation (min): 60 min    Short Term Goals: Week 1:  OT Short Term Goal 1 (Week 1): Pt will maintain static sitting balance at EOB/EOM for 5 min wiht MOD A OT Short Term Goal 2 (Week 1): Pt will recall hemi dressing techniques wiht min VC OT Short Term Goal 3 (Week 1): Pt will sit to stand in stedy iwht MAX A of 1 to decrease caregiver burden OT Short Term Goal 4 (Week 1): Pt will manage LUE during transfers wiht no more than 1 VC for L attention OT Short Term Goal 5 (Week 1): pt will locate all items on L of sink iwht no VC during grooming tasks  Skilled Therapeutic Interventions/Progress Updates:    Upon entering the room, pt supine in bed with no c/o pain and agreeable to exiting the bed. Supine >sit with max A. Static sitting balance with mod - max A as pt needing assistance for midline orientation. Pt transferred into recliner chair to the R with max A squat pivot transfer. Pt needing assistance for repositioning for safety and to protect hemiplegic side. Self ROM exercises for L UE with focus on pt attending to L side as he performs movement. Pt needing min - mod cuing to attend to tasks and for redirection. Pt remained in recliner chair with chair alarm donned and call bell within reach.   Therapy Documentation Precautions:  Precautions Precautions: Fall Precaution Comments: L hemi, SbP <160 Restrictions Weight Bearing Restrictions: No General: General PT Missed Treatment Reason: Patient fatigue Vital Signs: Therapy Vitals Temp: 98.6 F (37 C) Temp Source: Oral Pulse Rate: 83 Resp: 18 BP: (!) 151/102 Patient Position (if appropriate): Lying Oxygen Therapy SpO2: 94 % O2 Device: Room Air Pain: Pain Assessment Pain Score: 0-No pain   Therapy/Group: Individual  Therapy  Alen Bleacher 01/07/2019, 4:21 PM

## 2019-01-07 NOTE — Progress Notes (Signed)
Patrick Pollard PHYSICAL MEDICINE & REHABILITATION PROGRESS NOTE   Subjective/Complaints: Patient seen laying in bed this morning.  He states he did not sleep well overnight and wants medications to help him sleep.  He states he did not have a good weekend, but cannot identify why.  He states he still has not had a BM.  ROS + constipation.  Denies CP, shortness of breath, nausea, vomiting, diarrhea.  Objective:   No results found. Recent Labs    01/07/19 0629  WBC 9.7  HGB 17.7*  HCT 51.3  PLT 342   Recent Labs    01/07/19 0629  NA 129*  K 4.0  CL 91*  CO2 25  GLUCOSE 156*  BUN 24*  CREATININE 0.75  CALCIUM 10.0    Intake/Output Summary (Last 24 hours) at 01/07/2019 1203 Last data filed at 01/07/2019 1129 Gross per 24 hour  Intake 592 ml  Output 1700 ml  Net -1108 ml     Physical Exam: Vital Signs Blood pressure (!) 146/93, pulse 90, temperature 98.1 F (36.7 C), temperature source Oral, resp. rate 17, height 6\' 1"  (1.854 m), weight 95.8 kg, SpO2 95 %. Constitutional: No distress . Vital signs reviewed. HENT: Normocephalic.  Atraumatic. Eyes: EOMI. No discharge. Cardiovascular: RRR. No JVD. Respiratory: CTA Bilaterally. Normal effort. GI: BS +. Non-distended. Musc: No edema or tenderness in extremities. Motor: Right upper extremity/right lower extremity: 5/5 proximal distal Left upper extremity: Approximately 0/5, handgrip 2-/5 Left lower extremity: 0/5 proximal distal Sensation absent to light touch left upper extremity/left lower extremity Skin: Warm and dry.  Intact.  Assessment/Plan: 1. 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 with left HP which require 3+ hours per day of interdisciplinary therapy in a comprehensive inpatient rehab setting.  Physiatrist is providing close team supervision and 24 hour management of active medical problems listed below.  Physiatrist and rehab team continue to assess barriers to discharge/monitor patient progress  toward functional and medical goals  Care Tool:  Bathing  Bathing activity did not occur: Safety/medical concerns           Bathing assist       Upper Body Dressing/Undressing Upper body dressing   What is the patient wearing?: Pull over shirt    Upper body assist Assist Level: Maximal Assistance - Patient 25 - 49%    Lower Body Dressing/Undressing Lower body dressing      What is the patient wearing?: Incontinence brief     Lower body assist Assist for lower body dressing: Moderate Assistance - Patient 50 - 74%     Toileting Toileting Toileting Activity did not occur Press photographer and hygiene only): Safety/medical concerns  Toileting assist Assist for toileting: 2 Helpers(in and out cath)     Transfers Chair/bed transfer  Transfers assist  Chair/bed transfer activity did not occur: Safety/medical concerns  Chair/bed transfer assist level: Moderate Assistance - Patient 50 - 74%     Locomotion Ambulation   Ambulation assist   Ambulation activity did not occur: Safety/medical concerns          Walk 10 feet activity   Assist  Walk 10 feet activity did not occur: Safety/medical concerns        Walk 50 feet activity   Assist Walk 50 feet with 2 turns activity did not occur: Safety/medical concerns         Walk 150 feet activity   Assist Walk 150 feet activity did not occur: Safety/medical concerns  Walk 10 feet on uneven surface  activity   Assist Walk 10 feet on uneven surfaces activity did not occur: Safety/medical concerns         Wheelchair     Assist     Wheelchair activity did not occur: Safety/medical concerns         Wheelchair 50 feet with 2 turns activity    Assist    Wheelchair 50 feet with 2 turns activity did not occur: Safety/medical concerns       Wheelchair 150 feet activity     Assist Wheelchair 150 feet activity did not occur: Safety/medical concerns         Medical Problem List and Plan: 1.Left side weakness with facial droopas well as left hemi-sensory losssecondary to right thalamic hemorrhage with IVH secondary to hypertensive crisis  Continue CIR Left WHO and PRAFO ordered, pending  Notes reviewed- stroke due to hypertension, images reviewed-relatively large right thalamic hemorrhage, labs reviewed  2. Antithrombotics: -DVT/anticoagulation:SCDs -antiplatelet therapy: N/A 3. Pain Management:Tylenol as needed 4. Mood:Abilify 5 mg daily, Effexor 300 mg daily. Provide emotional support -antipsychotic agents: N/A 5. Neuropsych: This patientiscapable of making decisions on hisown behalf. 6. Skin/Wound Care:Routine skin checks 7. Fluids/Electrolytes/Nutrition:Routine ins and outs  8.  Essential hypertension  Cont Lisinopril 20 mg daily, Maxide 37.5-25 mg daily. Monitor with increased mobility Vitals:   01/07/19 1023 01/07/19 1027  BP: (!) 146/103 (!) 146/93  Pulse: 89 90  Resp:    Temp:    SpO2:     Amlodipine increased to 5mg  on 3/29  Slightly labile on 3/30 9. BPH. Flomax 0.4 mg daily, removed  Foley  10. History of GI bleed . Continue Protonix -pt reports persistent nausea and is interested in bringing a supplement in from home which works for him. I told him we could review herb if/when it is brought in             -in the mean time may use zofran po disintegrating tab 11. Hyperlipidemia. Resume Lipitor on discharge. 12.  Constipation-bowel meds increased on 3/30 13.  Prediabetes  Diet changed to carb modified  14.  Hyponatremia  Sodium 129 on 3/30  Continue to monitor 15.  Sleep disturbance  ECG on 3/24 reviewed, trazodone 50 started on 3/30  LOS: 3 days A FACE TO FACE EVALUATION WAS PERFORMED  Patrick Pollard 01/07/2019, 12:03 PM

## 2019-01-07 NOTE — Progress Notes (Signed)
Inpatient Rehabilitation Center Individual Statement of Services  Patient Name:  Clovis Madge  Date:  01/07/2019  Welcome to the Inpatient Rehabilitation Center.  Our goal is to provide you with an individualized program based on your diagnosis and situation, designed to meet your specific needs.  With this comprehensive rehabilitation program, you will be expected to participate in at least 3 hours of rehabilitation therapies Monday-Friday, with modified therapy programming on the weekends.  Your rehabilitation program will include the following services:  Physical Therapy (PT), Occupational Therapy (OT), Speech Therapy (ST), 24 hour per day rehabilitation nursing, Neuropsychology, Case Management (Social Worker), Rehabilitation Medicine, Nutrition Services and Pharmacy Services  Weekly team conferences will be held on Wednesdays to discuss your progress.  Your Social Worker will talk with you frequently to get your input and to update you on team discussions.  Team conferences with you and your family in attendance may also be held.  Expected length of stay:  3 to 4 weeks  Overall anticipated outcome:  Minimal assistance with activities of daily living; moderate assistance for mobility activities  Depending on your progress and recovery, your program may change. Your Social Worker will coordinate services and will keep you informed of any changes. Your Social Worker's name and contact numbers are listed  below.  The following services may also be recommended but are not provided by the Inpatient Rehabilitation Center:   Driving Evaluations  Home Health Rehabiltiation Services  Outpatient Rehabilitation Services   Arrangements will be made to provide these services after discharge if needed.  Arrangements include referral to agencies that provide these services.  Your insurance has been verified to be:  [employer] Your primary doctor is:  Dr. Dina Rich  Pertinent  information will be shared with your doctor and your insurance company.  Social Worker:  Staci Acosta, LCSW  [phone] or ([phone]  Information discussed with and copy given to patient by: Elvera Lennox, 01/07/2019, 10:48 AM

## 2019-01-07 NOTE — Progress Notes (Signed)
Inpatient Rehabilitation  Patient information reviewed and entered into eRehab system by Travaughn Vue M. Haruye Lainez, M.A., CCC/SLP, PPS Coordinator.  Information including medical coding, functional ability and quality indicators will be reviewed and updated through discharge.    Per nursing patient was given "Data Collection Information Summary" for Patients in Inpatient Rehabilitation Facilities with attached "Privacy Act Statement-Health Care Records" upon admission.   

## 2019-01-07 NOTE — Progress Notes (Signed)
Speech Language Pathology Daily Session Note  Patient Details  Name: Patrick Pollard MRN: [id number] Date of Birth: 1946/02/12  Today's Date: 01/07/2019 SLP Individual Time: 0900-1000 SLP Individual Time Calculation (min): 60 min  Short Term Goals: Week 1: SLP Short Term Goal 1 (Week 1): Pt will complete semi-complex money and medication management tasks with supervision cues.  SLP Short Term Goal 2 (Week 1): Pt will complete semi-complex reasoning tasks with Min A cues.  SLP Short Term Goal 3 (Week 1): Pt will demonstrate selective attention in moderately distracting environment for ~ 30 minutes with Min A cues.  SLP Short Term Goal 4 (Week 1): Pt will improve recall of complex information after lapsed time in 8 out fo 10 opportunities with Min A cues.   Skilled Therapeutic Interventions: Skilled ST services focused on cognitive skills. SLP facilitated further assessment of high level cognitive skills focusing on executive function by administering CLQT, pt scored 13 out 40 indicating severe impairment. Pt states he was surprised by results, but has never been much of a "puzzle" guy. SLP will continue to address high level problem solving and executive function skills in functional contexts. SLp used remainder of session to create a list with pt, of higher level cognitive task pt was responsible for on a daily basis ( money management/debit card, medication management...etc.) Pt expressed concerns with constipation and increase anxeity, SLP communication with nurse, Dorisann Frames, upon leaving room. Pt was left in room with call bell within reach and bed alarm set. ST recommends to continue skilled ST services.      Pain Pain Assessment Pain Scale: 0-10 Pain Score: 0-No pain  Therapy/Group: Individual Therapy  Stacie Templin  [facility] 01/07/2019, 3:57 PM

## 2019-01-07 NOTE — Progress Notes (Addendum)
Physical Therapy Session Note  Patient Details  Name: Patrick Pollard MRN: [id number] Date of Birth: Sep 06, 1946  Today's Date: 01/07/2019 PT Individual Time: 1000-1054 PT Individual Time Calculation (min): 54 min  and Today's Date: 01/07/2019 PT Missed Time: 21 Minutes Missed Time Reason: Patient fatigue  Short Term Goals: Week 1:  PT Short Term Goal 1 (Week 1): Pt will perform bed mobility with max assist of 1 consistently  PT Short Term Goal 2 (Week 1): Pt Will trasnfer to WC with max assist + 2  PT Short Term Goal 3 (Week 1): Pt will propell WC x 90ft with min assist  PT Short Term Goal 4 (Week 1): Pt will perform sit<>stand with max assist and LRAD   Skilled Therapeutic Interventions/Progress Updates:    Pt received supine in bed and agreeable to therapy session despite feeling nauseous and overall not feeling well but pt unable to provide additional detail. Pt performed 3 trials of supine to sit with heavy max assist for B LE management and trunk control with cuing for sequencing of logroll technique and use of R UE to assist with trunk upright. Pt tolerated sitting on EOB for no more than 30seconds each trial with max assist for sitting balance -  pt reports increased headache pain from 3/10 at beginning of session in supine to 5/10 upon 1st sitting trial, then 3.5/10 during 2nd sitting trial and then 3/10 on 3rd trial with pt also reporting neck pain with pt maintaining head position in forward flexed posture. Therapist performed supine L LE heel cord stretching 2 x due to noted decreased ankle ROM with pt education on importance of maintaining ROM for functional activity. Pt performed repeated rolling R with mod to max assist with pt education on importance of using R hemibody to assist with L hemibody management and to ensure proper positioning for joint integrity - cuing and manual facilitation for trunk flexion to aid in rolling. Pt performed repeated rolling L with mod assist  for L hemibody management and cuing for proper L UE shoulder alignment prior to rolling. Performed supine scooting with bed in trendelenburg and cuing for use of R UE on bedrails to assist with movement and therapist assisting with  L LE placement and manual facilitation for increased weightbearing and hip elevation from bed then performed repeated bridging with same facilitation for muscular activation. Performed repeated L LE heel slide with cuing for technique and palpation of minimal to no hip flexor muscular activation and primarily abdominal muscle contraction. Pt left supine in bed, needs in reach, and therapeutically positioned with PRAFO donned and LE placed in neutral hip rotation as well as and L UE hand splint donned and positioned on pillow.  Therapy Documentation Precautions:  Precautions Precautions: Fall Precaution Comments: L hemi, SbP <160 Restrictions Weight Bearing Restrictions: No  Vital Signs: Supine at beginning of session: 143/89 Sydeling after sitting on EOB: 139/87 Sitting EOB: 143/104 Supine: 146/103 Supine: 146/93 Supine at end of session: 149/96  PA notified of vitals during therapy session  Pain: Pt reported headache pain ranging from 3/10 to 5/10 throughout session with pt reporting increased headache pain when sitting on EOB and light sensitivity - therapist turned off overhead lights and sat EOB to pt tolerance for pain management  - RN and PA notified of increased pain   Therapy/Group: Individual Therapy  Ginny Forth, PT, DPT 01/07/2019, 10:31 AM

## 2019-01-08 ENCOUNTER — Inpatient Hospital Stay (HOSPITAL_COMMUNITY): Payer: Self-pay

## 2019-01-08 ENCOUNTER — Inpatient Hospital Stay (HOSPITAL_COMMUNITY): Payer: Self-pay | Admitting: Occupational Therapy

## 2019-01-08 ENCOUNTER — Encounter (HOSPITAL_COMMUNITY): Payer: Self-pay | Admitting: Psychology

## 2019-01-08 DIAGNOSIS — F332 Major depressive disorder, recurrent severe without psychotic features: Secondary | ICD-10-CM

## 2019-01-08 MED ORDER — FLEET ENEMA 7-19 GM/118ML RE ENEM
1.0000 | ENEMA | Freq: Once | RECTAL | Status: AC
  Administered 2019-01-08: 1 via RECTAL
  Filled 2019-01-08: qty 1

## 2019-01-08 NOTE — Progress Notes (Addendum)
[facility] PHYSICAL MEDICINE & REHABILITATION PROGRESS NOTE   Subjective/Complaints: Patient seen laying in bed this morning.  He states he slept well overnight.  He states he did not done his orthosis overnight because he was told that there was not an order.  Discussed with nursing.  Also discussed constipation with nursing.  ROS: Denies CP, shortness of breath, nausea, vomiting, diarrhea.  Objective:   No results found. Recent Labs    01/07/19 0629  WBC 9.7  HGB 17.7*  HCT 51.3  PLT 342   Recent Labs    01/07/19 0629  NA 129*  K 4.0  CL 91*  CO2 25  GLUCOSE 156*  BUN 24*  CREATININE 0.75  CALCIUM 10.0    Intake/Output Summary (Last 24 hours) at 01/08/2019 0951 Last data filed at 01/08/2019 0445 Gross per 24 hour  Intake 40 ml  Output 1800 ml  Net -1760 ml     Physical Exam: Vital Signs Blood pressure 138/88, pulse 83, temperature (!) 97.5 F (36.4 C), resp. rate 18, height 6\' 1"  (1.854 m), weight 95.8 kg, SpO2 96 %. Constitutional: No distress . Vital signs reviewed. HENT: Normocephalic.  Atraumatic. Eyes: EOMI. No discharge. Cardiovascular: RRR.  No JVD. Respiratory: CTA bilaterally.  Normal effort. GI: BS +. Non-distended. Musc: No edema or tenderness in extremities. Motor: Right upper extremity/right lower extremity: 5/5 proximal distal Left upper extremity: Approximately 0/5, handgrip 2-/5, stable Left lower extremity: 0/5 proximal distal, stable No increase in tone appreciated Sensation absent to light touch left upper extremity/left lower extremity Skin: Warm and dry.  Intact.  Assessment/Plan: 1. 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 with left HP which require 3+ hours per day of interdisciplinary therapy in a comprehensive inpatient rehab setting.  Physiatrist is providing close team supervision and 24 hour management of active medical problems listed below.  Physiatrist and rehab team continue to assess barriers to  discharge/monitor patient progress toward functional and medical goals  Care Tool:  Bathing              Bathing assist Assist Level: Total Assistance - Patient < 25%(Per OT Eval)     Upper Body Dressing/Undressing Upper body dressing   What is the patient wearing?: Pull over shirt    Upper body assist Assist Level: Maximal Assistance - Patient 25 - 49%    Lower Body Dressing/Undressing Lower body dressing      What is the patient wearing?: Incontinence brief     Lower body assist Assist for lower body dressing: Moderate Assistance - Patient 50 - 74%     Toileting Toileting    Toileting assist Assist for toileting: 2 Helpers(in and out cath)     Transfers Chair/bed transfer  Transfers assist  Chair/bed transfer activity did not occur: Safety/medical concerns  Chair/bed transfer assist level: Maximal Assistance - Patient 25 - 49%     Locomotion Ambulation   Ambulation assist   Ambulation activity did not occur: Safety/medical concerns          Walk 10 feet activity   Assist  Walk 10 feet activity did not occur: Safety/medical concerns        Walk 50 feet activity   Assist Walk 50 feet with 2 turns activity did not occur: Safety/medical concerns         Walk 150 feet activity   Assist Walk 150 feet activity did not occur: Safety/medical concerns         Walk 10 feet on uneven surface  activity   Assist Walk 10 feet on uneven surfaces activity did not occur: Safety/medical Engineer, technical sales activity did not occur: Safety/medical concerns         Wheelchair 50 feet with 2 turns activity    Assist    Wheelchair 50 feet with 2 turns activity did not occur: Safety/medical concerns       Wheelchair 150 feet activity     Assist Wheelchair 150 feet activity did not occur: Safety/medical concerns        Medical Problem List and Plan: 1.Left side weakness with  facial droopas well as left hemi-sensory losssecondary to right thalamic hemorrhage with IVH secondary to hypertensive crisis  Continue CIR Left WHO and PRAFO ordered, discussed donning with patient and nursing  Discussed and updated family via phone 2. Antithrombotics: -DVT/anticoagulation:SCDs -antiplatelet therapy: N/A 3. Pain Management:Tylenol as needed 4. Mood:Abilify 5 mg daily, Effexor 300 mg daily. Provide emotional support -antipsychotic agents: N/A 5. Neuropsych: This patientiscapable of making decisions on hisown behalf. 6. Skin/Wound Care:Routine skin checks 7. Fluids/Electrolytes/Nutrition:Routine ins and outs  8.  Essential hypertension  Cont Lisinopril 20 mg daily, Maxide 37.5-25 mg daily. Monitor with increased mobility Vitals:   01/08/19 0413 01/08/19 0846  BP: (!) 144/96 138/88  Pulse: 83   Resp: 18   Temp: (!) 97.5 F (36.4 C)   SpO2: 96%    Amlodipine increased to 5mg  on 3/29  Slightly labile, but?  Improving on 3/31 9. BPH. Flomax 0.4 mg daily, removed  Foley  10. History of GI bleed . Continue Protonix 11. Hyperlipidemia. Resume Lipitor on discharge. 12.  Constipation-bowel meds increased on 3/30, again on 3/31 13.  Prediabetes  Diet changed to carb modified  14.  Hyponatremia  Sodium 129 on 3/30  Labs ordered for tomorrow  Continue to monitor 15.  Sleep disturbance  ECG on 3/24 reviewed, trazodone 50 started on 3/30  LOS: 4 days A FACE TO FACE EVALUATION WAS PERFORMED  Patrick Pollard 01/08/2019, 9:51 AM

## 2019-01-08 NOTE — Consult Note (Signed)
Neuropsychological Consultation   Patient:   Patrick Pollard   DOB:   Jan 28, 1946  MR Number:  [id number]  Location:  [facility] [address] CENTER A [id number] [place] [address] Dept: [phone] Loc: [phone]           Date of Service:   01/08/2019  Start Time:   3 PM End Time:   4 PM  Provider/Observer:  Arley Phenix, Psy.D.       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  The patient has been maintained on Abilify, Effexor and Wellbutrin in the past but the patient reports that he is not taking the Wellbutrin now.  The patient presented on 01/01/2019 with acute onset of left-sided weakness, mild dysarthria and facial droop.  The patient reports that he was at a stranger's house looking to buy something and had gone into the bathroom.  It was at this time that he began having the symptoms and required assistance and the other individual called 911.  The patient's blood pressure 155 systolic upon arrival at the ED.  Cranial CT scan showed acute hemorrhage in the right lateral thalamus/posterior limb internal capsule with mild surrounding edema.  The patient has a strong history of significant depression but denies it worsening with this acute event.  The patient was referred for and is been admitted into the comprehensive inpatient rehabilitation program due to functional deficits primarily related to left-sided motor deficits.  Reason for Service:  The patient was referred for neuropsychological consultation due to adjustment and coping issues.  Below is the HPI for the current admission.  EXM:[id number] [name] is a 73 year old right-handed male with past history of GI bleed due to  polyp status post resection 2018, hypertension maintained on Maxide, depression continues with Abilify, Effexor as well as Wellbutrin.per chart review patient lives with spouse. One level home with ramped entrance. Independent prior to admission. Presented 01/01/2019 with acute onset of left-sided weakness, mild dysarthria and facial droop while up to the bathroom. Blood pressure 155 systolic upon arrival to the ED. Denied any chest pain or shortness of breath. Cranial CT scan showed a 3.1 x 2.6 x 2.3 cm acute hemorrhage in the right lateral thalamus/posterior limb internal capsule mild surrounding edema. CT angiogram of head and neck negative for vascular malformation. 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 Echocardiogram with ejection fraction of 60%. Systolic function normal. Placed on Cardene for blood pressure control. Tolerating a regular diet. Therapy evaluations completed with recommendations of physical medicine rehabilitation consult. Patient was admitted for a comprehensive rehabilitation program.  Current Status:  The patient denies a severe or significant worsening of his depressive symptoms but does report that it is been very stressful and difficult for him to cope with his current status.  He does maintain a positive outlook and is expecting continued improvement in the improvements that he has made over the past couple of days are quite motivating for him.  The patient reports that he had been experiencing significant pain particularly in the evening but this is gotten much better over the past day or 2.  He reports that that has allowed his situation to be much more manageable.   Behavioral Observation: Patrick Pollard  presents  as a 73 y.o.-year-old Right Caucasian Male who appeared his stated age. his dress was Appropriate and he was Well Groomed and his manners were Appropriate to the situation.  his participation was indicative of Appropriate and Attentive behaviors.  There were  any physical disabilities noted.  he displayed an appropriate level of cooperation and motivation.     Interactions:    Active Appropriate and Attentive  Attention:   within normal limits and attention span and concentration were age appropriate  Memory:   within normal limits; recent and remote memory intact  Visuo-spatial:  not examined  Speech (Volume):  low  Speech:   slurred; garbled  Thought Process:  Coherent and Relevant  Though Content:  WNL; not suicidal and not homicidal  Orientation:   person, place, time/date and situation  Judgment:   Good  Planning:   Good  Affect:    Appropriate  Mood:    Dysphoric  Insight:   Good  Intelligence:   high  Medical History:   Past Medical History:  Diagnosis Date  . Anxiety   . Colon polyps    s/p colonoscopy 12/2016 wtih polyp removal   . Depression    Psychiatric History:  The patient has a long history of significant major depressive events without psychotic feature.  He has maintained on Effexor and Abilify and has taken Wellbutrin at times.  Family Med/Psych History: History reviewed. No pertinent family history.  Risk of Suicide/Violence: low the patient denies any suicidal or homicidal ideation.  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  The patient has been maintained on Abilify, Effexor and Wellbutrin in the past but the patient reports that he is not taking the Wellbutrin now.  The patient presented on 01/01/2019 with acute onset of left-sided weakness, mild dysarthria and facial droop.  The patient reports that he was at a stranger's house looking to buy something and had gone into the bathroom.  It was at this time that he began having the symptoms and required assistance and the other individual called 911.  The patient's blood pressure 155 systolic  upon arrival at the ED.  Cranial CT scan showed acute hemorrhage in the right lateral thalamus/posterior limb internal capsule with mild surrounding edema.  The patient has a strong history of significant depression but denies it worsening with this acute event.  The patient was referred for and is been admitted into the comprehensive inpatient rehabilitation program due to functional deficits primarily related to left-sided motor deficits.  The patient denies a severe or significant worsening of his depressive symptoms but does report that it is been very stressful and difficult for him to cope with his current status.  He does maintain a positive outlook and is expecting continued improvement in the improvements that he has made over the past couple of days are quite motivating for him.  The patient reports that he had been experiencing significant pain particularly in the evening but this is gotten much better over the past day or 2.  He reports that that has allowed his situation to be much more manageable.  Disposition/Plan:  The patient denies any significant worsening of his depressive symptoms and he does report that he is continuing to respond well to his current psychotropic regimen.  I will follow-up with the patient first of next week to continue to assess and address issues  related to his coping as well as issues related to his underlying depressive disorder as we want to make sure that this does not exacerbate his rehabilitative efforts.  Diagnosis:    Left-sided intracerebral hemorrhage (HCC) - Plan: Ambulatory referral to Neurology         Electronically Signed   _______________________ Arley Phenix, Psy.D.

## 2019-01-08 NOTE — Progress Notes (Signed)
Physical Therapy Session Note  Patient Details  Name: Patrick Pollard MRN: [id number] Date of Birth: 06-Mar-1946  Today's Date: 01/08/2019 PT Individual Time: 1330-1445 PT Individual Time Calculation (min): 75 min   Short Term Goals: Week 1:  PT Short Term Goal 1 (Week 1): Pt will perform bed mobility with max assist of 1 consistently  PT Short Term Goal 2 (Week 1): Pt Will trasnfer to WC with max assist + 2  PT Short Term Goal 3 (Week 1): Pt will propell WC x 76ft with min assist  PT Short Term Goal 4 (Week 1): Pt will perform sit<>stand with max assist and LRAD   Skilled Therapeutic Interventions/Progress Updates:    Pt seated in w/c upon PT arrival, agreeable to therapy tx and denies pain. Pt reports that his back, bottom and neck are hurting from sitting in standard w/c all morning. Therapist provided pt with TIS w/c this session to allow for better positioning when pt is OOB and to increase OOB activity tolerance. Pt performed slideboard transfer from w/c>bed mod assist +2 to the R, sitting balance mod-max assist at times secondary to L lateral lean and poor postural control. Pt able to come to midline briefly with tactile/verbal cues and manual facilitation. Pt performed slideboard transfer from bed>TIS w/c with max assist +2 to the R. Pt transported to the gym. Therapist adjusted w/c headrest for improved cervical positioning. Pt performed slideboard transfer from TIS>mat with slideboard and max assist +2. Session focused on sitting balance and postural control with use of mirror for visual feedback, therapist providing manual facilitation for increased R sided weightbearing and trunk extension. In sitting pt requires CGA-min assist at times for static sitting balance with max cues, up to mod-max assist for static/dynamic sitting balance activities, increased assist with fatigue. In sitting pt worked on increased R lateral weightshift and R lateral trunk extension in order to perform overhead  reaching activity on R side. Pt requiring x 3 reclined rest breaks throughout session secondary to fatigue when sitting upright edge of mat. Pt also reports discomfort from not having BM recently. Pt performed slideboard transfer back to TIS w/c with max assist +2 to the L, going to the L pt requiring increased cues and facilitation to maintain R lateral weightshift through hips. Pt transported back to room in w/c and transferred to bed lateral scoot with slideboard max assist +2. Pt transferred to supine max assist+2 and left supine with needs in reach and bed alarm set.   Therapy Documentation Precautions:  Precautions Precautions: Fall Precaution Comments: L hemi, SbP <160 Restrictions Weight Bearing Restrictions: No    Therapy/Group: Individual Therapy  Cresenciano Genre, PT, DPT 01/08/2019, 7:56 AM

## 2019-01-08 NOTE — Progress Notes (Signed)
Occupational Therapy Session Note  Patient Details  Name: Patrick Pollard MRN: [id number] Date of Birth: 1946/08/14  Today's Date: 01/08/2019 OT Individual Time: 5859-2924 OT Individual Time Calculation (min): 76 min    Short Term Goals: Week 1:  OT Short Term Goal 1 (Week 1): Pt will maintain static sitting balance at EOB/EOM for 5 min wiht MOD A OT Short Term Goal 2 (Week 1): Pt will recall hemi dressing techniques wiht min VC OT Short Term Goal 3 (Week 1): Pt will sit to stand in stedy iwht MAX A of 1 to decrease caregiver burden OT Short Term Goal 4 (Week 1): Pt will manage LUE during transfers wiht no more than 1 VC for L attention OT Short Term Goal 5 (Week 1): pt will locate all items on L of sink iwht no VC during grooming tasks  Skilled Therapeutic Interventions/Progress Updates:    Pt completed ADL sit to stand at the sink during session.  Max assist for supine to sit with max assist for static sitting balance EOB.  Pt with increased pusher tendencies to the left in sitting.  Max assist was needed for squat pivot transfer from bed to wheelchair.  Once in the chair he was able to complete UB bathing with mod assist in supported sitting as well as LB bathing with total assist +2 (pt 30%) sit to stand.  Increased trunk flexion with pushing to the left noted in standing.  He needed max assist for donning pullover with total assist +2 (pt 30%) for donning LB clothing sit to stand as well.  Total assist for gripper socks however.  He was able to complete oral hygiene in sitting with setup help only. Noted right head preference throughout session but pt would scan left to locate therapist during conversation spontaneously.  Finished session with therapist positioning the LUE on half lap tray and pt sitting up in the wheelchair at bedside.  Call button in reach as well as phone with chair alarm in place.    Therapy Documentation Precautions:  Precautions Precautions: Fall Precaution  Comments: L hemi, SbP <160 Restrictions Weight Bearing Restrictions: No  Pain: Pain Assessment Pain Scale: Faces Pain Score: 0-No pain ADL: See Care Tool Section for some details of ADL  Therapy/Group: Individual Therapy  Island Dohmen OTR/L 01/08/2019, 11:47 AM

## 2019-01-08 NOTE — Progress Notes (Signed)
Speech Language Pathology Daily Session Note  Patient Details  Name: Patrick Pollard MRN: [id number] Date of Birth: June 06, 1946  Today's Date: 01/08/2019 SLP Individual Time: 0910-1000 SLP Individual Time Calculation (min): 50 min  Short Term Goals: Week 1: SLP Short Term Goal 1 (Week 1): Pt will complete semi-complex money and medication management tasks with supervision cues.  SLP Short Term Goal 2 (Week 1): Pt will complete semi-complex reasoning tasks with Min A cues.  SLP Short Term Goal 3 (Week 1): Pt will demonstrate selective attention in moderately distracting environment for ~ 30 minutes with Min A cues.  SLP Short Term Goal 4 (Week 1): Pt will improve recall of complex information after lapsed time in 8 out fo 10 opportunities with Min A cues.   Skilled Therapeutic Interventions: Skilled ST services focused on cognitive skills. SLP facilitated semi-complex problem solving skills utilizing money management (accout balancing without complex change), pt required mod A for organization fade to min A verbal cues for problem solving/error awareness. SLP facilitated semi-complex to more complex problem solving skills utilizing planning task (garden plot) pt required mod A verbal cues for organization, note taking to aid in recall and impulsivity. Pt demonstrated poor frustration tolerance when unable to correct error in more complex task stating " I am done with this. It is wearing me out." SLP provided education and encouragement, pt supported motivation stating " I am a fighter."  Pt was left in room with call bell within reach and bed alarm set. ST recommends to continue skilled ST services.      Pain Pain Assessment Pain Scale: Faces Pain Score: 0-No pain  Therapy/Group: Individual Therapy  Orell Hurtado  [facility] 01/08/2019, 12:34 PM

## 2019-01-09 ENCOUNTER — Inpatient Hospital Stay (HOSPITAL_COMMUNITY): Payer: Self-pay | Admitting: Occupational Therapy

## 2019-01-09 ENCOUNTER — Inpatient Hospital Stay (HOSPITAL_COMMUNITY): Payer: Self-pay | Admitting: Speech Pathology

## 2019-01-09 ENCOUNTER — Inpatient Hospital Stay (HOSPITAL_COMMUNITY): Payer: Self-pay | Admitting: Physical Therapy

## 2019-01-09 ENCOUNTER — Inpatient Hospital Stay (HOSPITAL_COMMUNITY): Payer: Self-pay

## 2019-01-09 DIAGNOSIS — F332 Major depressive disorder, recurrent severe without psychotic features: Secondary | ICD-10-CM

## 2019-01-09 LAB — BASIC METABOLIC PANEL
Anion gap: 13 (ref 5–15)
BUN: 39 mg/dL — ABNORMAL HIGH (ref 8–23)
CO2: 24 mmol/L (ref 22–32)
Calcium: 9.8 mg/dL (ref 8.9–10.3)
Chloride: 92 mmol/L — ABNORMAL LOW (ref 98–111)
Creatinine, Ser: 1.11 mg/dL (ref 0.61–1.24)
GFR calc Af Amer: >60 mL/min (ref >60)
Glucose, Bld: 145 mg/dL — ABNORMAL HIGH (ref 70–99)
POTASSIUM: 4.7 mmol/L (ref 3.5–5.1)
Sodium: 129 mmol/L — ABNORMAL LOW (ref 135–145)

## 2019-01-09 NOTE — Progress Notes (Signed)
Physical Therapy Session Note  Patient Details  Name: Patrick Pollard MRN: [id number] Date of Birth: 18-Dec-1945  Today's Date: 01/09/2019 PT Individual Time: 1330-1445 PT Individual Time Calculation (min): 75 min   Short Term Goals: Week 1:  PT Short Term Goal 1 (Week 1): Pt will perform bed mobility with max assist of 1 consistently  PT Short Term Goal 2 (Week 1): Pt Will trasnfer to WC with max assist + 2  PT Short Term Goal 3 (Week 1): Pt will propell WC x 15ft with min assist  PT Short Term Goal 4 (Week 1): Pt will perform sit<>stand with max assist and LRAD   Skilled Therapeutic Interventions/Progress Updates:    Pt supine in bed upon PT arrival, agreeable to therapy tx and reports neck/back pain this session as well as stomach discomfort. Therapist educated pt on positioning techniques for pain relief. Pt transferred to sitting EOB with use of bed rails and mod assist, cues for techniques. Pt with improved seated balance noted compared to yesterday, pt maintains static balance with supervision-CGA this session, occasional cues for midline orientation/correcting L lateral lean. Lateral scoot to w/c with slideboard and max assist, cues for techniques and manual facilitation for weightshifting. Pt used standing frame x 2 trials this session, in standing therapist provided manual facilitation for trunk/hip extension, pt used mirror for visual feedback to maintain midline while performed R UE reaching task. Pt transported to gym. Pt performed slideboard transfers this session mod assist +2 transferring to R and max assist +2 when transferring to L. Sitting edge of mat pt worked on maintaining midline this session while performing R lateral reaching task with mirror for visual feedback, pt maintains dynamic balance with CGA-min assist. Pt performed sit<>stand from elevated mat within stedy max A +2  this session and x 1 sit<>stand from elevated stedy  seat this session Max A +2. In standing pt  requires max assist for static standing balance and max cues for midline orientation, mirror for visual feedback, therapist providing manual facilitation for hip extension, trunk extension and external feedback/cues for R lateral weightshift to touch pt's pelvis to therapist's. Pt transferred back to w/c and transported back to room. Max assist +2 slideboard back to bed, sit>supine max assist+2. Pt left supine with needs in reach and bed alarm set.  Vitals monitored during session- BP in sitting 110/83, HR 101 bpm.   Therapy Documentation Precautions:  Precautions Precautions: Fall Precaution Comments: L hemi, SbP <160 Restrictions Weight Bearing Restrictions: No   Therapy/Group: Individual Therapy  Cresenciano Genre, PT, DPT 01/09/2019, 11:23 AM

## 2019-01-09 NOTE — Progress Notes (Signed)
Speech Language Pathology Daily Session Note  Patient Details  Name: Patrick Pollard MRN: [id number] Date of Birth: 16-Jul-1946  Today's Date: 01/09/2019 SLP Individual Time: 5374-8270 SLP Individual Time Calculation (min): 45 min  Short Term Goals: Week 1: SLP Short Term Goal 1 (Week 1): Pt will complete semi-complex money and medication management tasks with supervision cues.  SLP Short Term Goal 2 (Week 1): Pt will complete semi-complex reasoning tasks with Min A cues.  SLP Short Term Goal 3 (Week 1): Pt will demonstrate selective attention in moderately distracting environment for ~ 30 minutes with Min A cues.  SLP Short Term Goal 4 (Week 1): Pt will improve recall of complex information after lapsed time in 8 out fo 10 opportunities with Min A cues.   Skilled Therapeutic Interventions:  Skilled treatment session focused on cognition goals. SLP facilitated session by providing Mod A to pre-plan/identify barriers that exist when attempting medication management tasks. Mod A cues needed to develop strategies to target barriers. Pt is verbose with alternative rationalizations for areas of deficits. He is able to redirected to task and with Min A he is able to utilize previously created strategies to begin completing medication management. Pt left upright in bed, bed alarm on and all needs within reach. Continue per current plan of care.      Pain Pain Assessment Pain Scale: 0-10 Pain Score: 0-No pain  Therapy/Group: Individual Therapy  Patrick Pollard 01/09/2019, 12:43 PM

## 2019-01-09 NOTE — Plan of Care (Signed)
Due to the current state of emergency, patients may not be receiving their 3-hours of Medicare-mandated therapy.   

## 2019-01-09 NOTE — Progress Notes (Signed)
Physical Therapy Session Note  Patient Details  Name: Patrick Pollard MRN: [id number] Date of Birth: [birth year]  Today's Date: 01/09/2019 PT Individual Time: 1200-1230 PT Individual Time Calculation (min): 30 min   Short Term Goals: Week 1:  PT Short Term Goal 1 (Week 1): Pt will perform bed mobility with max assist of 1 consistently  PT Short Term Goal 2 (Week 1): Pt Will trasnfer to WC with max assist + 2  PT Short Term Goal 3 (Week 1): Pt will propell WC x 86f with min assist  PT Short Term Goal 4 (Week 1): Pt will perform sit<>stand with max assist and LRAD   Skilled Therapeutic Interventions/Progress Updates: [place]. Pt denies pain but states discomfort/bloating feeling in abdominals (per pt had small BM this am). Pt transported to [employer] participated in [employer] mode A 2 minute trials with arm rests removed for pt to participate in sitting balance activities. Pt required cues to return to midline after reaching on L. Pt did demonstrate intermittent correction of midline during activity. Pt returned to room after activity and performed SB transfer to L maxA x 2 with max tactile cues for increasing anterior wt shift. Pt was able to perform roll to R with modA to permit PTA to adjust draw sheet. Pt repositioned to comfort and left with call bell within reach and needs met.      Therapy Documentation Precautions:  Precautions Precautions: Fall Precaution Comments: L hemi, SbP <160 Restrictions Weight Bearing Restrictions: No General:   Vital Signs:  Pain: Pain Assessment Pain Scale: 0-10 Pain Score: 0-No pain Pain Type: Acute pain Pain Location: Head Pain Orientation: Posterior;Lower Pain Descriptors / Indicators: Headache Pain Frequency: Intermittent Pain Onset: Gradual Patients Stated Pain Goal: 0 Pain Intervention(s): Medication (See eMAR)    Therapy/Group: Individual Therapy  [place]  [name],  PTA  01/09/2019, 12:46 PM

## 2019-01-09 NOTE — Progress Notes (Addendum)
[facility] PHYSICAL MEDICINE & REHABILITATION PROGRESS NOTE   Subjective/Complaints: Patient seen laying in bed this morning.  He states he did not sleep well overnight.  ROS: Denies CP, shortness of breath, nausea, vomiting, diarrhea.  Objective:   No results found. Recent Labs    01/07/19 0629  WBC 9.7  HGB 17.7*  HCT 51.3  PLT 342   Recent Labs    01/07/19 0629 01/09/19 0545  NA 129* 129*  K 4.0 4.7  CL 91* 92*  CO2 25 24  GLUCOSE 156* 145*  BUN 24* 39*  CREATININE 0.75 1.11  CALCIUM 10.0 9.8    Intake/Output Summary (Last 24 hours) at 01/09/2019 1354 Last data filed at 01/09/2019 0700 Gross per 24 hour  Intake 322 ml  Output 1600 ml  Net -1278 ml     Physical Exam: Vital Signs Blood pressure (!) 140/94, pulse 77, temperature 97.8 F (36.6 C), resp. rate 18, height 6\' 1"  (1.854 m), weight 95.8 kg, SpO2 94 %. Constitutional: No distress . Vital signs reviewed. HENT: Normocephalic.  Atraumatic. Eyes: EOMI. No discharge. Cardiovascular: RRR.  No JVD. Respiratory: CTA bilaterally.  Normal effort. GI: BS +. Non-distended. Musc: No edema or tenderness in extremities. Motor: Right upper extremity/right lower extremity: 5/5 proximal distal Left upper extremity: Approximately 0/5, handgrip 2-/5, unchanged Left lower extremity: 0/5 proximally, 1/5 ankle dorsiflexion Skin: Warm and dry.  Intact.  Assessment/Plan: 1. 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 with left HP which require 3+ hours per day of interdisciplinary therapy in a comprehensive inpatient rehab setting.  Physiatrist is providing close team supervision and 24 hour management of active medical problems listed below.  Physiatrist and rehab team continue to assess barriers to discharge/monitor patient progress toward functional and medical goals  Care Tool:  Bathing    Body parts bathed by patient: Left arm, Chest, Abdomen, Right upper leg, Left upper leg, Right lower leg, Face   Body parts bathed by helper: Buttocks, Front perineal area, Right arm, Left lower leg Body parts n/a: Left lower leg   Bathing assist Assist Level: 2 Helpers     Upper Body Dressing/Undressing Upper body dressing   What is the patient wearing?: Pull over shirt    Upper body assist Assist Level: Maximal Assistance - Patient 25 - 49%    Lower Body Dressing/Undressing Lower body dressing      What is the patient wearing?: Incontinence brief, Pants     Lower body assist Assist for lower body dressing: 2 Helpers     Toileting Toileting    Toileting assist Assist for toileting: 2 Helpers(in and out cath)     Transfers Chair/bed transfer  Transfers assist  Chair/bed transfer activity did not occur: Safety/medical concerns  Chair/bed transfer assist level: 2 Helpers(sliding board)     Locomotion Ambulation   Ambulation assist   Ambulation activity did not occur: Safety/medical concerns          Walk 10 feet activity   Assist  Walk 10 feet activity did not occur: Safety/medical concerns        Walk 50 feet activity   Assist Walk 50 feet with 2 turns activity did not occur: Safety/medical concerns         Walk 150 feet activity   Assist Walk 150 feet activity did not occur: Safety/medical concerns         Walk 10 feet on uneven surface  activity   Assist Walk 10 feet on uneven surfaces activity did not  occur: Safety/medical Engineer, technical sales activity did not occur: Safety/medical concerns         Wheelchair 50 feet with 2 turns activity    Assist    Wheelchair 50 feet with 2 turns activity did not occur: Safety/medical concerns       Wheelchair 150 feet activity     Assist Wheelchair 150 feet activity did not occur: Safety/medical concerns        Medical Problem List and Plan: 1.Left side weakness with facial droopas well as left hemi-sensory losssecondary to right  thalamic hemorrhage with IVH secondary to hypertensive crisis  Continue CIR Left WHO and PRAFO ordered, have discussed donning with patient and nursing  Team conference today to discuss current and goals and coordination of care, home and environmental barriers, and discharge planning with nursing, case manager, and therapies.  2. Antithrombotics: -DVT/anticoagulation:SCDs -antiplatelet therapy: N/A 3. Pain Management:Tylenol as needed 4. Mood:Abilify 5 mg daily, Effexor 300 mg daily. Provide emotional support -antipsychotic agents: N/A 5. Neuropsych: This patientiscapable of making decisions on hisown behalf. 6. Skin/Wound Care:Routine skin checks 7. Fluids/Electrolytes/Nutrition:Routine ins and outs  8.  Essential hypertension  Cont Lisinopril 20 mg daily, Maxide 37.5-25 mg daily. Monitor with increased mobility Vitals:   01/08/19 2001 01/09/19 0545  BP: (!) 140/94 (!) 140/94  Pulse: 82 77  Resp: 19 18  Temp: 98 F (36.7 C) 97.8 F (36.6 C)  SpO2: 95% 94%   Amlodipine increased to 5mg  on 3/29  Relatively controlled on 4/1 9. BPH. Flomax 0.4 mg daily, removed  Foley  10. History of GI bleed . Continue Protonix 11. Hyperlipidemia. Resume Lipitor on discharge. 12.  Constipation-bowel meds increased on 3/30, again on 3/31, again on 4/1  Extensive discussion with wife who explained that patient had a intra-abdominal mass that was?  That caused significant constipation and was resected and was to have a follow-up colonoscopy prior to being admitted in the hospital  Will consider repeat imaging if no improvement by tomorrow 13.  Prediabetes  Diet changed to carb modified   CBGs ordered 14.  Hyponatremia  Sodium 129 on 4/1  Continue to monitor 15.  Sleep disturbance  ECG on 3/24 reviewed, trazodone 50 started on 3/30  Sleep chart ordered 16.  AKI  Creatinine 1.11 on 4/1  Encourage fluids  LOS: 5 days A FACE TO FACE EVALUATION  WAS PERFORMED  Ankit Karis Juba 01/09/2019, 1:54 PM

## 2019-01-09 NOTE — Progress Notes (Signed)
Occupational Therapy Session Note  Patient Details  Name: Patrick Pollard MRN: [id number] Date of Birth: 04/26/1946  Today's Date: 01/09/2019 OT Individual Time: 7564-3329 OT Individual Time Calculation (min): 58 min    Short Term Goals: Week 1:  OT Short Term Goal 1 (Week 1): Pt will maintain static sitting balance at EOB/EOM for 5 min wiht MOD A OT Short Term Goal 2 (Week 1): Pt will recall hemi dressing techniques wiht min VC OT Short Term Goal 3 (Week 1): Pt will sit to stand in stedy iwht MAX A of 1 to decrease caregiver burden OT Short Term Goal 4 (Week 1): Pt will manage LUE during transfers wiht no more than 1 VC for L attention OT Short Term Goal 5 (Week 1): pt will locate all items on L of sink iwht no VC during grooming tasks  Skilled Therapeutic Interventions/Progress Updates:    Pt worked on bathing and dressing during session.  Max assist for supine to sit EOB with total assist +2 (pt 30%) for sliding board transfers.  Once in the tilt in space wheelchair, he was taken over to the sink.  Increased head turn to the right with left lean noted, requiring max assist for static/dynamic sitting balance secondary to LOB to the left.  He was able to complete UB bathing with overall mod assist as well as UB dressing with max assist.  Total +2 (pt 25%) for LB bathing and dressing secondary to pushing to the left side in standing.  Decreased activation of the LLE noted in standing.  Pt also exhibiting flexed head with rotation to the right.  Finished session with total assist for donning gripper socks.  Pt left reclined in tilt in space wheelchair with call button and phone in reach and safety alarm belt in place.    Therapy Documentation Precautions:  Precautions Precautions: Fall Precaution Comments: L hemi, SbP <160 Restrictions Weight Bearing Restrictions: No  Pain: Pain Assessment Pain Scale: Faces Pain Score: 0-No pain Pain Type: Acute pain Pain Location: Head Pain  Orientation: Posterior;Lower Pain Descriptors / Indicators: Headache Pain Frequency: Intermittent Pain Onset: Gradual Patients Stated Pain Goal: 0 Pain Intervention(s): Medication (See eMAR) ADL: See Care Tool Section for some details of ADLs  Therapy/Group: Individual Therapy  Timur Nibert OTR/L 01/09/2019, 11:14 AM

## 2019-01-10 ENCOUNTER — Inpatient Hospital Stay (HOSPITAL_COMMUNITY): Payer: Self-pay

## 2019-01-10 ENCOUNTER — Inpatient Hospital Stay (HOSPITAL_COMMUNITY): Payer: Self-pay | Admitting: Occupational Therapy

## 2019-01-10 ENCOUNTER — Inpatient Hospital Stay (HOSPITAL_COMMUNITY): Payer: Self-pay | Admitting: Speech Pathology

## 2019-01-10 ENCOUNTER — Inpatient Hospital Stay (HOSPITAL_COMMUNITY): Payer: Medicare Other

## 2019-01-10 ENCOUNTER — Inpatient Hospital Stay (HOSPITAL_COMMUNITY): Payer: Self-pay | Admitting: Physical Therapy

## 2019-01-10 DIAGNOSIS — N179 Acute kidney failure, unspecified: Secondary | ICD-10-CM

## 2019-01-10 DIAGNOSIS — K6389 Other specified diseases of intestine: Secondary | ICD-10-CM

## 2019-01-10 LAB — GLUCOSE, CAPILLARY
Glucose-Capillary: 155 mg/dL — ABNORMAL HIGH (ref 70–99)
Glucose-Capillary: 171 mg/dL — ABNORMAL HIGH (ref 70–99)
Glucose-Capillary: 179 mg/dL — ABNORMAL HIGH (ref 70–99)
Glucose-Capillary: 163 mg/dL — ABNORMAL HIGH (ref 70–99)

## 2019-01-10 MED ORDER — INSULIN ASPART 100 UNIT/ML ~~LOC~~ SOLN
0.0000 [IU] | Freq: Three times a day (TID) | SUBCUTANEOUS | Status: DC
  Administered 2019-01-10 – 2019-01-11 (×4): 2 [IU] via SUBCUTANEOUS
  Administered 2019-01-12: 3 [IU] via SUBCUTANEOUS
  Administered 2019-01-12 (×2): 2 [IU] via SUBCUTANEOUS
  Administered 2019-01-13 – 2019-01-14 (×4): 1 [IU] via SUBCUTANEOUS
  Administered 2019-01-14: 13:00:00 2 [IU] via SUBCUTANEOUS
  Administered 2019-01-15 (×2): 1 [IU] via SUBCUTANEOUS
  Administered 2019-01-16 – 2019-01-17 (×4): 2 [IU] via SUBCUTANEOUS
  Administered 2019-01-17: 1 [IU] via SUBCUTANEOUS
  Administered 2019-01-17: 18:00:00 2 [IU] via SUBCUTANEOUS
  Administered 2019-01-18: 08:00:00 1 [IU] via SUBCUTANEOUS
  Administered 2019-01-18: 3 [IU] via SUBCUTANEOUS
  Administered 2019-01-18 – 2019-01-19 (×3): 1 [IU] via SUBCUTANEOUS
  Administered 2019-01-19: 08:00:00 2 [IU] via SUBCUTANEOUS
  Administered 2019-01-20: 1 [IU] via SUBCUTANEOUS
  Administered 2019-01-20: 2 [IU] via SUBCUTANEOUS
  Administered 2019-01-21 (×3): 1 [IU] via SUBCUTANEOUS
  Administered 2019-01-22: 09:00:00 2 [IU] via SUBCUTANEOUS
  Administered 2019-01-22 – 2019-01-23 (×3): 1 [IU] via SUBCUTANEOUS
  Administered 2019-01-23: 08:00:00 2 [IU] via SUBCUTANEOUS
  Administered 2019-01-23 – 2019-01-25 (×4): 1 [IU] via SUBCUTANEOUS
  Administered 2019-01-25: 08:00:00 2 [IU] via SUBCUTANEOUS
  Administered 2019-01-25 – 2019-01-26 (×2): 1 [IU] via SUBCUTANEOUS
  Administered 2019-01-26: 2 [IU] via SUBCUTANEOUS
  Administered 2019-01-27: 18:00:00 1 [IU] via SUBCUTANEOUS
  Administered 2019-01-27 – 2019-01-28 (×3): 2 [IU] via SUBCUTANEOUS
  Administered 2019-01-28: 18:00:00 3 [IU] via SUBCUTANEOUS
  Administered 2019-01-29 (×3): 1 [IU] via SUBCUTANEOUS
  Administered 2019-01-30: 08:00:00 2 [IU] via SUBCUTANEOUS
  Administered 2019-01-30 – 2019-01-31 (×4): 1 [IU] via SUBCUTANEOUS
  Administered 2019-01-31: 08:00:00 2 [IU] via SUBCUTANEOUS
  Administered 2019-02-01 – 2019-02-04 (×6): 1 [IU] via SUBCUTANEOUS
  Administered 2019-02-04: 09:00:00 2 [IU] via SUBCUTANEOUS
  Administered 2019-02-05: 1 [IU] via SUBCUTANEOUS
  Administered 2019-02-06: 08:00:00 2 [IU] via SUBCUTANEOUS
  Administered 2019-02-07: 10:00:00 1 [IU] via SUBCUTANEOUS

## 2019-01-10 MED ORDER — INSULIN ASPART 100 UNIT/ML ~~LOC~~ SOLN
0.0000 [IU] | Freq: Every day | SUBCUTANEOUS | Status: DC
  Administered 2019-01-27: 22:00:00 2 [IU] via SUBCUTANEOUS

## 2019-01-10 NOTE — Plan of Care (Signed)
  Problem: RH BOWEL ELIMINATION Goal: RH STG MANAGE BOWEL W/MEDICATION W/ASSISTANCE Description STG Manage Bowel with Medication with mod I Assistance.  Outcome: Not Progressing; LBM 3/31; px with constipation

## 2019-01-10 NOTE — Progress Notes (Signed)
Physical Therapy Session Note  Patient Details  Name: Patrick Pollard MRN: [id number] Date of Birth: [birth year]  Today's Date: 01/10/2019 PT Individual Time: [phone] and 1347-1450 PT Individual Time Calculation (min): 26 min and 63 min  Short Term Goals: Week 1:  PT Short Term Goal 1 (Week 1): Pt will perform bed mobility with max assist of 1 consistently  PT Short Term Goal 2 (Week 1): Pt Will trasnfer to WC with max assist + 2  PT Short Term Goal 3 (Week 1): Pt will propell WC x 19ft with min assist  PT Short Term Goal 4 (Week 1): Pt will perform sit<>stand with max assist and LRAD   Skilled Therapeutic Interventions/Progress Updates:    Session 1: Pt received asleep, supine in bed and agreeable to therapy session with minimal encouragement upon awakening. Reports generalized neck pain at beginning of session but not rated. Performed rolling R/L using bed rails to adjust LB clothing with mod assist for L hemibody placement and for complete rolling. Performed 3 sets, 10-15 reps of supine bridging with therapist providing weightbearing through L LE and manual facilitation/tactile cuing for improved gluteal activation with pt demonstrating ability to clear bottom from bed with min assist for lifting. Performed supine to sit with cuing for logroll technique and use of R LE to assist with L LE management and max assist for trunk upright with cuing for use of R UE to assist with trunk support. Pt demonstrates improving sitting balance with therapist providing visual reference for midline positioning with pt able to maintain static sitting in midline for 10-20 seconds without UE support and close supervision. Progressed to sitting while performing R UE reaching to external target with therapist continuing to provide visual reference for midline and cuing for pt to regain balance prior to performing next reach with primarily close supervision and occasional min assist for recovery. Pt performed sit to  supine with max assist for trunk descent and LE management. Pt left supine in bed with needs in reach and bed alarm on.   Session 2: Pt received supine in bed and continues to report generalized neck and L shoulder pain with RN present and aware. Pt performed supine to sit with max assist of 1-2 for trunk upright with cuing for sequencing of logroll technique for increased independence, use of R LE to assist L LE to EOB, and use of R UE to assist with trunk upright. Pt performed R lateral scoot transfer from bed to w/c using transfer board with max assist for scooting hips and for trunk control with cuing for sequencing. Performed L lateral scoot transfer from w/c to mat table with mod assist for scooting and trunk balance with cuing for sequencing.  Pt requires total assist with transfer board placement for all transfers with cuing for weight shifting and head/hips relationship - pt able to recall head/hips relationship at end of session without cuing. Therapist educated pt on contribution of impaired muscular strength on his shoulder pain and applied kinesiotape for muscular facilitation at the shoulder and educated pt on purpose of tape, duration to leave tape donned, and to remove tape in event of skin irritation. Pt performed seated balance cross body reaching task on EOM using R UE from high to low level with mirror feedback and verbal cuing for improved midline orientation and to recover balance between reaches - pt required primarily min to mod assist for trunk control throughout - when reaching down required max assist to recover from L lateral LOB.  Pt performed R lateral scoot EOM to w/c and L lateral scoot w/c to EOB both using transfer board with mod assist of 1 and cuing for sequencing. Performed sit to supine with max assist for eccentric trunk control and B LE management. Pt left supine in bed with needs in reach and nursing staff present to assume care of patient.   Therapy  Documentation Precautions:  Precautions Precautions: Fall Precaution Comments: L hemi, SbP <160 Restrictions Weight Bearing Restrictions: No  Pain:  Reports generalized posterior neck and L shoulder pain during both sessions with RN aware. During 2nd session therapist applied kinesiotape for pain management.    Therapy/Group: Individual Therapy  Ginny Forth, PT, DPT 01/10/2019, 7:54 AM

## 2019-01-10 NOTE — Progress Notes (Signed)
Patient does not have any appetite; refused breakfast and lunch claims if he eats he will be nauseated. Nausea medicine given but only asked for ensure and water at this time.

## 2019-01-10 NOTE — Progress Notes (Signed)
[facility] PHYSICAL MEDICINE & REHABILITATION PROGRESS NOTE   Subjective/Complaints: Patient seen laying in bed this morning.  He states he did not sleep at all overnight.  However, per sleep chart patient slept well.  When expressed with the patient patient states "Oh".  ROS: Denies CP, shortness of breath, nausea, vomiting, diarrhea.  Objective:   No results found. No results for input(s): WBC, HGB, HCT, PLT in the last 72 hours. Recent Labs    01/09/19 0545  NA 129*  K 4.7  CL 92*  CO2 24  GLUCOSE 145*  BUN 39*  CREATININE 1.11  CALCIUM 9.8    Intake/Output Summary (Last 24 hours) at 01/10/2019 1345 Last data filed at 01/10/2019 0830 Gross per 24 hour  Intake 360 ml  Output 1350 ml  Net -990 ml     Physical Exam: Vital Signs Blood pressure (!) 133/91, pulse 81, temperature 98.3 F (36.8 C), resp. rate 19, height 6\' 1"  (1.854 m), weight 95.8 kg, SpO2 97 %. Constitutional: No distress . Vital signs reviewed. HENT: Normocephalic.  Atraumatic. Eyes: EOMI. No discharge. Cardiovascular: RRR.  No JVD. Respiratory: CTA bilaterally.  Normal effort. GI: BS +. Non-distended. Musc: No edema or tenderness in extremities. Neuro: Alert Motor:  Left upper extremity: Approximately 0/5, handgrip 2-/5, stable Left lower extremity: 0/5 proximally, 0/5 ankle dorsiflexion Skin: Warm and dry.  Intact.  Assessment/Plan: 1. 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 with left HP which require 3+ hours per day of interdisciplinary therapy in a comprehensive inpatient rehab setting.  Physiatrist is providing close team supervision and 24 hour management of active medical problems listed below.  Physiatrist and rehab team continue to assess barriers to discharge/monitor patient progress toward functional and medical goals  Care Tool:  Bathing    Body parts bathed by patient: Left arm, Chest, Abdomen, Right upper leg, Left upper leg, Right lower leg, Face   Body parts  bathed by helper: Buttocks, Front perineal area, Right arm, Left lower leg Body parts n/a: Left lower leg   Bathing assist Assist Level: 2 Helpers     Upper Body Dressing/Undressing Upper body dressing   What is the patient wearing?: Pull over shirt    Upper body assist Assist Level: Moderate Assistance - Patient 50 - 74%    Lower Body Dressing/Undressing Lower body dressing      What is the patient wearing?: Incontinence brief, Pants     Lower body assist Assist for lower body dressing: Total Assistance - Patient < 25%     Toileting Toileting    Toileting assist Assist for toileting: 2 Helpers(in and out cath)     Transfers Chair/bed transfer  Transfers assist  Chair/bed transfer activity did not occur: Safety/medical concerns  Chair/bed transfer assist level: Total Assistance - Patient < 25%(sliding board)     Locomotion Ambulation   Ambulation assist   Ambulation activity did not occur: Safety/medical concerns          Walk 10 feet activity   Assist  Walk 10 feet activity did not occur: Safety/medical concerns        Walk 50 feet activity   Assist Walk 50 feet with 2 turns activity did not occur: Safety/medical concerns         Walk 150 feet activity   Assist Walk 150 feet activity did not occur: Safety/medical concerns         Walk 10 feet on uneven surface  activity   Assist Walk 10 feet on uneven  surfaces activity did not occur: Safety/medical Engineer, technical sales activity did not occur: Safety/medical concerns         Wheelchair 50 feet with 2 turns activity    Assist    Wheelchair 50 feet with 2 turns activity did not occur: Safety/medical concerns       Wheelchair 150 feet activity     Assist Wheelchair 150 feet activity did not occur: Safety/medical concerns        Medical Problem List and Plan: 1.Left side weakness with facial droopas well as left  hemi-sensory losssecondary to right thalamic hemorrhage with IVH secondary to hypertensive crisis  Continue CIR Left WHO and PRAFO ordered, have discussed donning with patient and nursing 2. Antithrombotics: -DVT/anticoagulation:SCDs -antiplatelet therapy: N/A 3. Pain Management:Tylenol as needed 4. Mood:Abilify 5 mg daily, Effexor 300 mg daily. Provide emotional support -antipsychotic agents: N/A 5. Neuropsych: This patientiscapable of making decisions on hisown behalf. 6. Skin/Wound Care:Routine skin checks 7. Fluids/Electrolytes/Nutrition:Routine ins and outs  8.  Essential hypertension  Cont Lisinopril 20 mg daily, Maxide 37.5-25 mg daily. Monitor with increased mobility Vitals:   01/09/19 1942 01/10/19 0517  BP: 113/80 (!) 133/91  Pulse: 86 81  Resp: 18 19  Temp: 98.3 F (36.8 C) 98.3 F (36.8 C)  SpO2: 95% 97%   Amlodipine increased to 5mg  on 3/29  Labile on 4/2 9. BPH. Flomax 0.4 mg daily, removed  Foley  10. History of GI bleed . Continue Protonix 11. Hyperlipidemia. Resume Lipitor on discharge. 12.  Constipation-bowel meds increased on 3/30, again on 3/31, again on 4/1  Extensive discussion with wife who explained that patient had a intra-abdominal mass that was?  That caused significant constipation and was resected and was to have a follow-up colonoscopy prior to being admitted in the hospital  Will order CT abd/pelvis due to history of colonic mass 13.  Prediabetes  Diet changed to carb modified   SSI ordered on 4/2  Elevated on 4/2 14.  Hyponatremia  Sodium 129 on 4/1  Labs ordered for tomorrow  Continue to monitor 15.  Sleep disturbance  ECG on 3/24 reviewed, trazodone 50 started on 3/30  Sleep chart ordered 16.  AKI  Creatinine 1.11 on 4/1  Labs ordered for tomorrow  Encourage fluids  LOS: 6 days A FACE TO FACE EVALUATION WAS PERFORMED  Ankit Karis Juba 01/10/2019, 1:45 PM

## 2019-01-10 NOTE — Progress Notes (Addendum)
Speech Language Pathology Weekly Progress and Session Note  Patient Details  Name: Patrick Pollard MRN: [id number] Date of Birth: 1945/10/16  Beginning of progress report period: January 05, 2019 End of progress report period: January 10, 2019  Today's Date: 01/10/2019 SLP Individual Time: 1601-0932 SLP Individual Time Calculation (min): 60 min  Short Term Goals: Week 1: SLP Short Term Goal 1 (Week 1): Pt will complete semi-complex money and medication management tasks with supervision cues.  SLP Short Term Goal 1 - Progress (Week 1): Not met SLP Short Term Goal 2 (Week 1): Pt will complete semi-complex reasoning tasks with Min A cues.  SLP Short Term Goal 2 - Progress (Week 1): Progressing toward goal SLP Short Term Goal 3 (Week 1): Pt will demonstrate selective attention in moderately distracting environment for ~ 30 minutes with Min A cues.  SLP Short Term Goal 3 - Progress (Week 1): Progressing toward goal SLP Short Term Goal 4 (Week 1): Pt will improve recall of complex information after lapsed time in 8 out fo 10 opportunities with Min A cues.  SLP Short Term Goal 4 - Progress (Week 1): Progressing toward goal    New Short Term Goals: Week 2: SLP Short Term Goal 1 (Week 2): Pt will complete semi-complex money and medication management tasks with supervision cues.  SLP Short Term Goal 2 (Week 2): Pt will complete semi-complex reasoning tasks with Min A cues.  SLP Short Term Goal 3 (Week 2): Pt will demonstrate selective attention in moderately distracting environment for ~ 30 minutes with Min A cues.  SLP Short Term Goal 4 (Week 2): Pt will improve recall of complex information after lapsed time in 8 out fo 10 opportunities with Min A cues.   Weekly Progress Updates: Pt has made progress in skilled ST sessions and as a result he is progressing towards his goals. The Cognitive Linguistic Quick Test was administered during this reporting period with pt obtaining severe rating for executive  functions. Therapy has focused on organization of tasks such as identifying potential barriers to task, creating a strategy to use and then attempting problem solving activity. Pt with progress but his progress fluctuates d/t verbose distractions and pain. Skilled ST continues to be required to target these areas, increase pt's functional independence and reduce caregiver burden.      Intensity: Minumum of 1-2 x/day, 30 to 90 minutes Frequency: 3 to 5 out of 7 days Duration/Length of Stay: 4/21 Treatment/Interventions: Cognitive remediation/compensation;Medication managment;Functional tasks;Patient/family education;Therapeutic Activities   Daily Session  Skilled Therapeutic Interventions: Skilled treatment session focused on cognition goals. SLP facilitated session by providing Min A cues to recall strategies to utilize prior to attempting medication management task. Strategies included opening pill bottle and placing bead on paper to visual color of bead and opening all of the compartments and closing them as he administers each bead/pill. Pt was very internally distracted by back pain and increased depression (CSW made aware of worsening depression). Pt required more than a reasonable amount of time and Mod A to effectively use strategies to problem solve. Encouragement and support given, pt left semi-reclined in bed, bed alarm on and all needs within reach.      General    Pain Pain Assessment Pain Scale: 0-10 Pain Score: 0-No pain  Therapy/Group: Individual Therapy  Lugene Beougher 01/10/2019, 10:04 AM

## 2019-01-10 NOTE — Progress Notes (Signed)
Occupational Therapy Session Note  Patient Details  Name: Patrick Pollard MRN: [id number] Date of Birth: Sep 06, 1946  Today's Date: 01/10/2019 OT Individual Time: 8185-6314 OT Individual Time Calculation (min): 76 min    Short Term Goals: Week 1:  OT Short Term Goal 1 (Week 1): Pt will maintain static sitting balance at EOB/EOM for 5 min wiht MOD A OT Short Term Goal 2 (Week 1): Pt will recall hemi dressing techniques wiht min VC OT Short Term Goal 3 (Week 1): Pt will sit to stand in stedy iwht MAX A of 1 to decrease caregiver burden OT Short Term Goal 4 (Week 1): Pt will manage LUE during transfers wiht no more than 1 VC for L attention OT Short Term Goal 5 (Week 1): pt will locate all items on L of sink iwht no VC during grooming tasks  Skilled Therapeutic Interventions/Progress Updates:    Pt completed transfer from supine to sit EOB with max assist.  He was able to maintain static sitting balance with supervision for less than 5 seconds with RUE support before exhibiting pushing to the left and LOB, requiring max assist to correct.  Sliding board transfer completed from bed to tilt in space wheelchair with total assist.  He then focused on bathing and dressing sit to stand at the sink.  Utilized Ship broker for feedback with work on dynamic sitting balance EOC for some tasks such as washing his face, squeezing out the washcloth and for some UB bathing.  Increased LOB to the left when sitting during these tasks with overall max assist to maintain balance.  Total assist +2 (pt 30%) for LB selfcare sit to stand.  Worked on maintaining head midline alignment as well as pt keeps his head turned to the right majority of the time.  Max instructional cueing for hemi techniques to donn a pullover shirt with mod facilitation as well as for donning brief and pants.  Therapist assisted with TEDs and gripper socks at total assist level.  Finished session with pt in the tilt in space wheelchair with call button and  phone in reach and safety alarm belt in place.    Therapy Documentation Precautions:  Precautions Precautions: Fall Precaution Comments: L hemi, SbP <160 Restrictions Weight Bearing Restrictions: No   Pain: Pain Assessment Pain Scale: Faces Pain Score: 0-No pain Faces Pain Scale: Hurts a little bit Pain Type: Acute pain Pain Location: Shoulder Pain Orientation: Left Pain Descriptors / Indicators: Discomfort Pain Onset: With Activity Pain Intervention(s): Repositioned ADL: See Care Tool Section for some details of ADL  Therapy/Group: Individual Therapy  Thu Baggett OTR/L 01/10/2019, 12:08 PM

## 2019-01-11 ENCOUNTER — Inpatient Hospital Stay (HOSPITAL_COMMUNITY): Payer: Self-pay | Admitting: Speech Pathology

## 2019-01-11 ENCOUNTER — Inpatient Hospital Stay (HOSPITAL_COMMUNITY): Payer: Self-pay | Admitting: Physical Therapy

## 2019-01-11 ENCOUNTER — Inpatient Hospital Stay (HOSPITAL_COMMUNITY): Payer: Self-pay | Admitting: Occupational Therapy

## 2019-01-11 DIAGNOSIS — F321 Major depressive disorder, single episode, moderate: Secondary | ICD-10-CM

## 2019-01-11 DIAGNOSIS — D72829 Elevated white blood cell count, unspecified: Secondary | ICD-10-CM

## 2019-01-11 LAB — CBC WITH DIFFERENTIAL/PLATELET
Abs Immature Granulocytes: 0.09 10*3/uL — ABNORMAL HIGH (ref 0.00–0.07)
Basophils Absolute: 0 10*3/uL (ref 0.0–0.1)
Basophils Relative: 0 %
Eosinophils Absolute: 0 10*3/uL (ref 0.0–0.5)
Eosinophils Relative: 0 %
HCT: 53.9 % — ABNORMAL HIGH (ref 39.0–52.0)
Hemoglobin: 18.3 g/dL — ABNORMAL HIGH (ref 13.0–17.0)
Immature Granulocytes: 1 %
Lymphocytes Relative: 11 %
Lymphs Abs: 1.4 10*3/uL (ref 0.7–4.0)
MCH: 29.1 pg (ref 26.0–34.0)
MCHC: 34 g/dL (ref 30.0–36.0)
MCV: 85.8 fL (ref 80.0–100.0)
Monocytes Absolute: 0.8 10*3/uL (ref 0.1–1.0)
Monocytes Relative: 7 %
Neutro Abs: 10.1 10*3/uL — ABNORMAL HIGH (ref 1.7–7.7)
Neutrophils Relative %: 81 %
Platelets: 356 10*3/uL (ref 150–400)
RBC: 6.28 MIL/uL — ABNORMAL HIGH (ref 4.22–5.81)
RDW: 12.7 % (ref 11.5–15.5)
WBC: 12.5 10*3/uL — ABNORMAL HIGH (ref 4.0–10.5)
nRBC: 0 % (ref 0.0–0.2)

## 2019-01-11 LAB — BASIC METABOLIC PANEL
Anion gap: 10 (ref 5–15)
BUN: 39 mg/dL — ABNORMAL HIGH (ref 8–23)
CO2: 24 mmol/L (ref 22–32)
Calcium: 9.9 mg/dL (ref 8.9–10.3)
Chloride: 91 mmol/L — ABNORMAL LOW (ref 98–111)
Creatinine, Ser: 0.89 mg/dL (ref 0.61–1.24)
GFR calc Af Amer: >60 mL/min (ref >60)
GFR calc non Af Amer: >60 mL/min (ref >60)
Glucose, Bld: 161 mg/dL — ABNORMAL HIGH (ref 70–99)
Potassium: 4.8 mmol/L (ref 3.5–5.1)
Sodium: 125 mmol/L — ABNORMAL LOW (ref 135–145)

## 2019-01-11 LAB — GLUCOSE, CAPILLARY
Glucose-Capillary: 153 mg/dL — ABNORMAL HIGH (ref 70–99)
Glucose-Capillary: 199 mg/dL — ABNORMAL HIGH (ref 70–99)
Glucose-Capillary: 180 mg/dL — ABNORMAL HIGH (ref 70–99)
Glucose-Capillary: 207 mg/dL — ABNORMAL HIGH (ref 70–99)

## 2019-01-11 MED ORDER — SODIUM CHLORIDE 0.9 % IV SOLN
INTRAVENOUS | Status: DC
  Administered 2019-01-11 – 2019-01-12 (×2): via INTRAVENOUS

## 2019-01-11 MED ORDER — MIRTAZAPINE 15 MG PO TABS
7.5000 mg | ORAL_TABLET | Freq: Every day | ORAL | Status: DC
  Administered 2019-01-11 – 2019-01-23 (×13): 7.5 mg via ORAL
  Filled 2019-01-11 (×13): qty 1

## 2019-01-11 NOTE — Progress Notes (Addendum)
Social Work Patient ID: Patrick Pollard, male   DOB: 1946/07/04, 73 y.o.   MRN: [id number]   CSW met with pt 01-09-19 and spoke with his family via telephone to update them on team conference discussion and targeted d/c date of 02-01-19.  CSW answered their questions and dtr remained adamant that she would care for her father once he is d/c'd.  CSW will continue to follow and assist as needed.  ST has some concerns about pt's mood/behavior the last couple of days.  She initiated a video call with pt and his family and she and CSW talked with RN and PA/MD about concerns and psychiatry will be consulted.  CSW had already placed pt on the neuropsychology list for Monday, 01-14-19.  Also talked with PT about getting pt outside today for some fresh air and sunshine.

## 2019-01-11 NOTE — Patient Care Conference (Signed)
Inpatient RehabilitationTeam Conference and Plan of Care Update Date: 01/09/2019   Time: 11:00 AM    Patient Name: Patrick Pollard      Medical Record Number: [id number]  Date of Birth: 1946/01/20 Sex: Male         Room/Bed: 4W19C/4W19C-01 Payor Info: Payor: [employer]vertising copy[employer] MEDICARE / Plan: [employer] MEDICARE / Product Type: *No Product type* /    Admitting Diagnosis: ICH  Admit Date/Time:  01/04/2019  3:36 PM Admission Comments: No comment available   Primary Diagnosis:  <principal problem not specified> Principal Problem: <principal problem not specified>  Patient Active Problem List   Diagnosis Date Noted  . Leukocytosis   . AKI (acute kidney injury) (HCC)   . Colonic mass   . Severe episode of recurrent major depressive disorder, without psychotic features (HCC)   . Sleep disturbance   . Hyponatremia   . Prediabetes   . Slow transit constipation   . Essential hypertension   . Labile blood pressure   . Hypertensive emergency 01/04/2019  . Hyperlipemia 01/04/2019  . BPH (benign prostatic hyperplasia) 01/04/2019  . Thyroid mass, left 01/04/2019  . Left-sided intracerebral hemorrhage (HCC) 01/04/2019  . 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 Chronic post-traumatic stress disorder (PTSD) 12/25/2018  . Major depressive disorder, recurrent episode, moderate (HCC) 12/04/2018  . GIB (gastrointestinal bleeding) 12/31/2017    Expected Discharge Date: Expected Discharge Date: 02/01/19  Team Members Present: Physician leading conference: Dr. Maryla Morrow Social Worker Present: Staci Acosta, LCSW Nurse Present: Kennyth Arnold, RN PT Present: Woodfin Ganja, PT OT Present: Perrin Maltese, OT SLP Present: Reuel Derby, SLP PPS Coordinator present : Edson Snowball, PT     Current Status/Progress Goal Weekly Team Focus  Medical    Left side weakness with facial droop as well as left hemi-sensory loss secondary to right thalamic hemorrhage with IVH  secondary to hypertensive crisis  Improve mobility, HTN, constipation, sleep, urinary retention, hyponatremia  See above   Bowel/Bladder   Occasional bowel incontinence, Cude intermittent cath [facility] constipation   enema 03/31 LBM 03/31  able to void continent of B/B normal bowel pattern free of constipation  cath [facility] laxatives prn assist and offer BRP prn   Swallow/Nutrition/ Hydration             ADL's   max assist for sitting balance, total +2 for standing balance with LB selfcare.  Mod assist for UB bathing with total assist +2 (pt 25%) for LB dressing, Max hand over hand for LUE functional use.  Brunnstrum stage III in the arm and hand  min assist overall  selfcare retraining, neuromuscular re-education, balance retraining, transfer training, pt/family education   Mobility             Communication             Safety/Cognition/ Behavioral Observations  Mod-Min A   Supervision A  semi-complex problem solving, complex recall, selective attention    Pain   Pt c/o occasional neck pain   free of pain   assess for pain Qshift and prn medicate prn assess for relief    Skin   skin intact ecchymosis Right wrist  skin remain intact   assess skin qshift and prn    Rehab Goals Patient on target to meet rehab goals: Yes Rehab Goals Revised: none *See Care Plan and progress notes for long and short-term goals.     Barriers to Discharge  Current Status/Progress Possible Resolutions Date Resolved  Physician    Medical stability     See above  Therapies, optimize BP/bowel meds, follow labs, bladder meds      Nursing                  PT                    OT                  SLP Behavior verbose and off-topic            SW                Discharge Planning/Teaching Needs:  Pt to return to his home where his wife and dtr will provide necessary care.  Pt's dtr to come for family education closer to d/c.   Team Discussion:  Pt's blood pressure is up and down and MD is adjusting  medications.  Sleep med added, but pt stated it didn't help him.  Sleep chart initiated.  Pt with a long hx of depression and constipation.  Pt is continent of bowel and requires in and out cath every 7-8 hours.  Checking PVRs.  Pt has head and neck pain and across his shoulders.  Pt is total +2 for bathing and dressing and pushes to the left.  He has a head preference to the right.  OT goals set for min A.  Pt needs max A to sit and min to mod A for other PT tasks.  He has minimal movement in his left arm.  Pt is min to mod A with ST for semi-complex tasks.  He needs strategies.  Revisions to Treatment Plan:  none    Continued Need for Acute Rehabilitation Level of Care: The patient requires daily medical management by a physician with specialized training in physical medicine and rehabilitation for the following conditions: Daily direction of a multidisciplinary physical rehabilitation program to ensure safe treatment while eliciting the highest outcome that is of practical value to the patient.: Yes Daily medical management of patient stability for increased activity during participation in an intensive rehabilitation regime.: Yes Daily analysis of laboratory values and/or radiology reports with any subsequent need for medication adjustment of medical intervention for : Neurological problems;Blood pressure problems;Urological problems;Other   I attest that I was present, lead the team conference, and concur with the assessment and plan of the team.Team conference was held via web/ teleconference due to COVID - 19.   Edra Riccardi, [place] 01/11/2019, 2:33 PM

## 2019-01-11 NOTE — Progress Notes (Signed)
Occupational Therapy Weekly Progress Note  Patient Details  Name: Patrick Pollard MRN: [id number] Date of Birth: Aug 13, 1946  Beginning of progress report period: January 05, 2019 End of progress report period: January 10, 2019  Today's Date: 01/11/2019 OT Individual Time: 7353-2992 OT Individual Time Calculation (min): 73 min    Patient has met 0 of 5 short term goals.  Pt has made slow progress toward OT goals this week secondary to the significance of his CVA.  He continues to demonstrate dependency for static sitting balance, currently needing max assist secondary to pusher tendencies to the left.  He also exhibits right head turn as well.  LUE functional use is at a max assist for bathing tasks with current movement at a Brunstrum stage III level in the hand and arm.  He can exhibit some active shoulder adduction and flexion as well as gross digit flexion with slight trace extension.  Transfers are currently total assist +2 (pt 30%) for sliding board from bed to tilt in space wheelchair.  Min assist for UB bathing in supported sitting with max instructional cueing to sequence.  He needs max assist for UB dressing as well as he cannot carryover hemi techniques from session to session.  He is able to complete LB bathing and dressing at total assist +2 (pt 25%) at the sink.  Feel overall secondary to the significance of his CVA that he will continue to benefit from CIR level therapies.  Overall goals set at min assist level.  Feel he will need greater assist than this at discharge so will downgrade to mod assist overall.    Patient continues to demonstrate the following deficits: muscle weakness, impaired timing and sequencing, unbalanced muscle activation, motor apraxia, decreased coordination and decreased motor planning, decreased midline orientation, decreased attention to left, left side neglect and decreased motor planning, decreased attention, decreased awareness, decreased problem solving,  decreased memory and delayed processing and decreased sitting balance, decreased standing balance, decreased postural control, hemiplegia and decreased balance strategies and therefore will continue to benefit from skilled OT intervention to enhance overall performance with BADL.  Patient not progressing toward long term goals.  See goal revision..  Continue plan of care.  OT Short Term Goals Week 2:  OT Short Term Goal 1 (Week 2): Pt will maintain static sitting balance at EOB/EOM for 5 min wiht MOD A OT Short Term Goal 2 (Week 2): Pt will recall hemi dressing techniques wiht min VC OT Short Term Goal 3 (Week 2): Pt will sit to stand in stedy with MAX A of 1 to decrease caregiver burden OT Short Term Goal 4 (Week 2): Pt will manage LUE during transfers wiht no more than 1 VC for L attention. OT Short Term Goal 5 (Week 2): Pt will locate all items on L of sink iwht no more than min instructional cueing during grooming tasks.  Skilled Therapeutic Interventions/Progress Updates:    Pt completed bathing and dressing sit to stand at the sink this session.  Max assist for supine to sit EOB with max assist needed for static sitting balance prior to transfer to the tilt in space wheelchair.  He needed total assist +2 (pt 30%) for sliding board transfers Bobath method to the wheelchair.  Once in the chair, he was positioned at the sink where he completed bathing and dressing.  Max instructional cueing for sequencing bathing with max hand over hand assist for LUE integration for washing the right arm.  He continues to exhibit moderate  pushing to the left with decreased midline orientation and decreased spatial awareness.  Head turn to the right is present 99% of the time as well but he will scan across midline to the left when given instructional cueing to do so.  He needed total assist +2 (pt 25%) for standing balance during LB selfcare.  Increased trunk flexion as well as cervical flexion with left side pushing  is present.  Max instructional cueing for all hemi dressing techniques throughout.  Finished session with transfer back to the bed with total assist +2 (pt 30%) via sliding board for pt to rest and nursing to administer treatment if necessary.  Pt with reports of nausea and abdominal pain secondary to constipation during session.  Call button and phone in reach with safety alarm on as well.    Therapy Documentation Precautions:  Precautions Precautions: Fall Precaution Comments: L hemi, SbP <160 Restrictions Weight Bearing Restrictions: No  Pain: Pain Assessment Pain Scale: Faces Pain Score: 0-No pain ADL: See Care Tool Section for some details of ADL  Therapy/Group: Individual Therapy  Ethelwyn Gilbertson OTR/L 01/11/2019, 10:49 AM

## 2019-01-11 NOTE — Progress Notes (Signed)
Speech Language Pathology Daily Session Note  Patient Details  Name: Patrick Pollard MRN: [id number] Date of Birth: Aug 02, 1946  Today's Date: 01/11/2019 SLP Individual Time: 1300-1400 SLP Individual Time Calculation (min): 60 min  Short Term Goals: Week 2: SLP Short Term Goal 1 (Week 2): Pt will complete semi-complex money and medication management tasks with supervision cues.  SLP Short Term Goal 2 (Week 2): Pt will complete semi-complex reasoning tasks with Min A cues.  SLP Short Term Goal 3 (Week 2): Pt will demonstrate selective attention in moderately distracting environment for ~ 30 minutes with Min A cues.  SLP Short Term Goal 4 (Week 2): Pt will improve recall of complex information after lapsed time in 8 out fo 10 opportunities with Min A cues.   Skilled Therapeutic Interventions:  Skilled treatment session focused on cognition goals. SLP received pt lying in bed with his head resting on his socks. Although pt knows this Clinical research associate he appeared startled and didn't really engage with Clinical research associate. Pt very out of sorts/confused and perseverated on need to have bowel movement for last 14 days. Pt has refused meals for last 2 days. Pt with statements that he is "tired of living this way" meaning acute deficits related to CVA. Pt difficult to engage, no eye contact. Nursing made aware and she stated that earlier in morning pt requested Dr Ermalinda Barrios phone number. CSW and PA made aware. This Clinical research associate further facilitated session by placing facetime call with pt, his wife and daughter. Pt continued to be confused as he attempted to remove his lower partials but he was pulling on his natural teeth as his partials were not in his mouth. Additionally, he seemed distant and didn't make eye contact with family. Family offers that pt benefits from change of scenery, going outside, having the blinds open, lights on etc when he starts to experience increased s/s of depression. SLP created place with charge nurse for pt  to be taken to dayroom in am and pm to facilitate increased social opportunities. Also coordinated with PT to take pt outside in afternoon session this day as well as opened pt's blinds. Although pt agrees with all of the above plans, he frequently offers reasons as to why he can't be up in wheelchair etc. His family endorses that pt frequently provides alternative rationale for most tasks.      Pain    Therapy/Group: Individual Therapy  Nahlia Hellmann 01/11/2019, 2:44 PM

## 2019-01-11 NOTE — Progress Notes (Signed)
[place] PHYSICAL MEDICINE & REHABILITATION PROGRESS NOTE   Subjective/Complaints: Patient seen laying in bed this morning.  He states he did not sleep well overnight due to being sore all over.  He does not recall if he asked for any pain medications.  Sleep chart not updated.  ROS: Denies CP, shortness of breath, nausea, vomiting, diarrhea.  Objective:   Ct Abdomen Pelvis Wo Contrast  Result Date: 01/10/2019 CLINICAL DATA:  Nausea, constipation. EXAM: CT ABDOMEN AND PELVIS WITHOUT CONTRAST TECHNIQUE: Multidetector CT imaging of the abdomen and pelvis was performed following the standard protocol without IV contrast. COMPARISON:  CT scan of December 31, 2017. FINDINGS: Lower chest: Left lower lobe atelectasis or pneumonia is noted. Hepatobiliary: No focal liver abnormality is seen. No gallstones, gallbladder wall thickening, or biliary dilatation. Pancreas: Unremarkable. No pancreatic ductal dilatation or surrounding inflammatory changes. Spleen: Normal in size without focal abnormality. Adrenals/Urinary Tract: Adrenal glands are unremarkable. Kidneys are normal, without renal calculi, focal lesion, or hydronephrosis. Bladder is unremarkable. Stomach/Bowel: The stomach appears normal. There is no evidence of bowel obstruction or inflammation. The appendix is not visualized. Vascular/Lymphatic: Aortic atherosclerosis. No enlarged abdominal or pelvic lymph nodes. Reproductive: Stable mild prostatic enlargement is noted. Other: No abdominal wall hernia or abnormality. No abdominopelvic ascites. Musculoskeletal: No acute or significant osseous findings. IMPRESSION: Left lower lobe atelectasis or pneumonia. No acute abnormality seen in the abdomen or pelvis. Stable mild prostatic enlargement. Aortic Atherosclerosis (ICD10-I70.0). Electronically Signed   By: Lupita Raider, M.D.   On: 01/10/2019 19:33   Recent Labs    01/11/19 0535  WBC 12.5*  HGB 18.3*  HCT 53.9*  PLT 356   Recent Labs     01/09/19 0545 01/11/19 0535  NA 129* 125*  K 4.7 4.8  CL 92* 91*  CO2 24 24  GLUCOSE 145* 161*  BUN 39* 39*  CREATININE 1.11 0.89  CALCIUM 9.8 9.9    Intake/Output Summary (Last 24 hours) at 01/11/2019 1021 Last data filed at 01/11/2019 0837 Gross per 24 hour  Intake 640 ml  Output 1900 ml  Net -1260 ml     Physical Exam: Vital Signs Blood pressure (!) 145/94, pulse 91, temperature 98.4 F (36.9 C), temperature source Oral, resp. rate 16, height 6\' 1"  (1.854 m), weight 95.8 kg, SpO2 98 %. Constitutional: No distress . Vital signs reviewed. HENT: Normocephalic.  Atraumatic. Eyes: EOMI. No discharge. Cardiovascular: RRR.  No JVD. Respiratory: CTA bilaterally.  Normal effort. GI: BS +. Non-distended. Musc: No edema or tenderness in extremities. Neuro: Alert Motor:  Left upper extremity: Approximately 0/5, handgrip 2+/5 Left lower extremity: 0/5 proximally, 0/5 ankle dorsiflexion, unchanged Skin: Warm and dry.  Intact.  Assessment/Plan: 1. 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 with left HP which require 3+ hours per day of interdisciplinary therapy in a comprehensive inpatient rehab setting.  Physiatrist is providing close team supervision and 24 hour management of active medical problems listed below.  Physiatrist and rehab team continue to assess barriers to discharge/monitor patient progress toward functional and medical goals  Care Tool:  Bathing    Body parts bathed by patient: Left arm, Chest, Abdomen, Right upper leg, Left upper leg, Right lower leg, Face   Body parts bathed by helper: Buttocks, Front perineal area, Right arm, Left lower leg Body parts n/a: Left lower leg   Bathing assist Assist Level: 2 Helpers     Upper Body Dressing/Undressing Upper body dressing   What is the patient wearing?: Pull over  shirt    Upper body assist Assist Level: Moderate Assistance - Patient 50 - 74%    Lower Body Dressing/Undressing Lower body  dressing      What is the patient wearing?: Incontinence brief, Pants     Lower body assist Assist for lower body dressing: Total Assistance - Patient < 25%     Toileting Toileting    Toileting assist Assist for toileting: 2 Helpers(in and out cath)     Transfers Chair/bed transfer  Transfers assist  Chair/bed transfer activity did not occur: Safety/medical concerns  Chair/bed transfer assist level: Maximal Assistance - Patient 25 - 49%(using transfer board)     Locomotion Ambulation   Ambulation assist   Ambulation activity did not occur: Safety/medical concerns          Walk 10 feet activity   Assist  Walk 10 feet activity did not occur: Safety/medical concerns        Walk 50 feet activity   Assist Walk 50 feet with 2 turns activity did not occur: Safety/medical concerns         Walk 150 feet activity   Assist Walk 150 feet activity did not occur: Safety/medical concerns         Walk 10 feet on uneven surface  activity   Assist Walk 10 feet on uneven surfaces activity did not occur: Safety/medical concerns         Wheelchair     Assist     Wheelchair activity did not occur: Safety/medical concerns         Wheelchair 50 feet with 2 turns activity    Assist    Wheelchair 50 feet with 2 turns activity did not occur: Safety/medical concerns       Wheelchair 150 feet activity     Assist Wheelchair 150 feet activity did not occur: Safety/medical concerns        Medical Problem List and Plan: 1.Left side weakness with facial droopas well as left hemi-sensory losssecondary to right thalamic hemorrhage with IVH secondary to hypertensive crisis  Continue CIR Left WHO and PRAFO ordered, have discussed donning with patient and nursing 2. Antithrombotics: -DVT/anticoagulation:SCDs -antiplatelet therapy: N/A 3. Pain Management:Tylenol as needed 4. Mood:Abilify 5 mg daily, Effexor  300 mg daily. Provide emotional support -antipsychotic agents: N/A 5. Neuropsych: This patientiscapable of making decisions on hisown behalf. 6. Skin/Wound Care:Routine skin checks 7. Fluids/Electrolytes/Nutrition:Routine ins and outs   Poor p.o. intake- Remeron started on 4/3 8.  Essential hypertension  Cont Lisinopril 20 mg daily,   Maxide 37.5-25 mg daily DC'd on 4/4 due to hyponatremia  Monitor with increased mobility Vitals:   01/10/19 1937 01/11/19 0523  BP: (!) 152/98 (!) 145/94  Pulse: 79 91  Resp: 16 16  Temp: 98.7 F (37.1 C) 98.4 F (36.9 C)  SpO2: 97% 98%   Amlodipine increased to  on 3/29  ?  Trending up on 4/3 9. BPH. Flomax 0.4 mg daily, removed  Foley  10. History of GI bleed . Continue Protonix 11. Hyperlipidemia. Resume Lipitor on discharge. 12.  Constipation-bowel meds increased on 3/30, again on 3/31, again on 4/1  Extensive discussion with wife who explained that patient had a intra-abdominal mass that was?  That caused significant constipation and was resected and was to have a follow-up colonoscopy prior to being admitted in the hospital  CT abd/pelvis reviewed, relatively unremarkable 13.  Prediabetes  Diet changed to carb modified   SSI ordered on 4/2  Elevated on 4/3 14.  Hyponatremia  Sodium 125 on 4/3  Labs ordered for tomorrow  Labs ordered for Monday  Continue to monitor 15.  Sleep disturbance  ECG on 3/24 reviewed, trazodone 50 started on 3/30, DC'd on 4/3  Remeron started on 4/3  Sleep chart ordered 16.  AKI  Creatinine 0.89 on 4/3  IVF nightly started on 4/3-echo reviewed, EF stable  Encourage fluids 17.  Leukocytosis-possibly hemoconcentrated  WBCs 12.5 on 4/3  Labs ordered for Monday  Afebrile  Continue to monitor  LOS: 7 days A FACE TO FACE EVALUATION WAS PERFORMED  Ankit Karis Juba 01/11/2019, 10:21 AM

## 2019-01-11 NOTE — Consult Note (Addendum)
[facility] Face-to-Face Psychiatry Consult   Reason for Consult:  SI Referring Physician:  Dr. Claudette Laws  Patient Identification: Patrick Pollard MRN:  [id number] Principal Diagnosis: MDD (major depressive disorder), recurrent episode, moderate (HCC) Diagnosis:  Active Problems:   Left-sided intracerebral hemorrhage (HCC)   Sleep disturbance   Hyponatremia   Prediabetes   Slow transit constipation   Essential hypertension   Labile blood pressure   Severe episode of recurrent major depressive disorder, without psychotic features (HCC)   AKI (acute kidney injury) (HCC)   Colonic mass   Leukocytosis   Total Time spent with patient: 1 hour  Subjective:   Patrick Pollard is a 73 y.o. male patient admitted to CIR following hospitalization for right thalamic hemorrhage with IVH secondary to hypertension.  HPI:   Per chart review, patient was admitted on 3/24 after he became weak on his left side and fell in the bathroom while at a friend's place. He was admitted to the hospital for treatment of right thalamic hemorrhage with IVH secondary to hypertensive crisis. He was admitted to CIR on 3/27. 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 He has a history of depression. Home medications include Effexor 300 mg daily and Abilify  Mg daily. He was started on Remeron for poor PO intake and sleep today and Trazodone was discontinued.   Of note, he was last seen by his outpatient psychiatrist at [facility] on 3/17. He reported a history of chronic depression without full remission and intermittent SI. He reported an improvement in his mood after starting Abilify at a prior visit (2/25). Abilify was increased to 5 mg daily with a plan to follow up in 3 months.   On interview, Patrick Pollard is oriented to person, place and year. He believes the month is March. He is knowledgeable of his medical conditions and medications. He does not demonstrate overt cognitive deficits. He reports feeling frustrated due  to his medical condition. He was previously independent of his ADLs. He reports that he feels like his occupational therapist can tell when he is frustrated during OT. He was provided with supportive therapy related to ongoing stressors. He denies SI, HI or AVH. Of note, he reportedly made a statement to his nurse this morning that he "wanted to see Dr. [place] Lions." He was taken outside and his mood appeared to improve. He reports an improvement in his mood after the addition of Abilify but a decline in his mood since hospitalization. He reports poor appetite secondary to nausea and poor sleep since hospitalization. He is receptive to speaking to a therapist. He would like to journal about his emotions.  Past Psychiatric History: Depression and anxiety.   Risk to Self:  None. Denies SI.  Risk to Others:  None. Denies HI. Prior Inpatient Therapy:  Denies  Prior Outpatient Therapy:  He is followed by Dr. Louisa Second. He was previously followed at the [facility].   Past Medical History:  Past Medical History:  Diagnosis Date  . Anxiety   . Colon polyps    s/p colonoscopy 12/2016 wtih polyp removal   . Depression    History reviewed. No pertinent surgical history. Family History: History reviewed. No pertinent family history. Family Psychiatric  History: Denies  Social History:  Social History   Substance and Sexual Activity  Alcohol Use Not Currently     Social History   Substance and Sexual Activity  Drug Use Never    Social History   Socioeconomic History  .  Marital status: Married    Spouse name: Not on file  . Number of children: 3  . Years of education: Not on file  . Highest education level: Some college, no degree  Occupational History  . Not on file  Social Needs  . Financial resource strain: Not hard at all  . Food insecurity:    Worry: Never true    Inability: Never true  . Transportation needs:    Medical: No    Non-medical: Not on file  Tobacco Use  .  Smoking status: Former Smoker    Years: 37.00    Types: Cigarettes    Last attempt to quit: 12/31/1996    Years since quitting: 22.0  . Smokeless tobacco: Never Used  Substance and Sexual Activity  . Alcohol use: Not Currently  . Drug use: Never  . Sexual activity: Not on file  Lifestyle  . Physical activity:    Days per week: Not on file    Minutes per session: Not on file  . Stress: Only a little  Relationships  . Social connections:    Talks on phone: Not on file    Gets together: Not on file    Attends religious service: More than 4 times per year    Active member of club or organization: No    Attends meetings of clubs or organizations: Never    Relationship status: Married  Other Topics Concern  . Not on file  Social History Narrative  . Not on file   Additional Social History: He lives at home with his wife of 50 years and 31 y/o daughter in [place]. He has another adult daughter and son who do not live at home. 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 He denies alcohol or illicit substance use.     Allergies:   Allergies  Allergen Reactions  . Rocephin [Ceftriaxone] Other (See Comments)    Light headed, and nearly passed out    Labs:  Results for orders placed or performed during the hospital encounter of 01/04/19 (from the past 48 hour(s))  Glucose, capillary     Status: Abnormal   Collection Time: 01/10/19  6:48 AM  Result Value Ref Range   Glucose-Capillary 155 (H) 70 - 99 mg/dL  Glucose, capillary     Status: Abnormal   Collection Time: 01/10/19 11:39 AM  Result Value Ref Range   Glucose-Capillary 171 (H) 70 - 99 mg/dL   Comment 1 Notify RN   Glucose, capillary     Status: Abnormal   Collection Time: 01/10/19  5:56 PM  Result Value Ref Range   Glucose-Capillary 179 (H) 70 - 99 mg/dL   Comment 1 Notify RN   Glucose, capillary     Status: Abnormal   Collection Time: 01/10/19  9:12 PM  Result Value Ref Range    Glucose-Capillary 163 (H) 70 - 99 mg/dL  Basic metabolic panel     Status: Abnormal   Collection Time: 01/11/19  5:35 AM  Result Value Ref Range   Sodium 125 (L) 135 - 145 mmol/L   Potassium 4.8 3.5 - 5.1 mmol/L   Chloride 91 (L) 98 - 111 mmol/L   CO2 24 22 - 32 mmol/L   Glucose, Bld 161 (H) 70 - 99 mg/dL   BUN 39 (H) 8 - 23 mg/dL   Creatinine, Ser 4.09 0.61 - 1.24 mg/dL   Calcium 9.9 8.9 - 81.1 mg/dL   GFR calc non Af Amer >  60 >60 mL/min   GFR calc Af Amer >60 >60 mL/min   Anion gap 10 5 - 15    Comment: Performed at [facility], [address], [place], [place] [address]  CBC with Differential/Platelet     Status: Abnormal   Collection Time: 01/11/19  5:35 AM  Result Value Ref Range   WBC 12.5 (H) 4.0 - 10.5 K/uL   RBC 6.28 (H) 4.22 - 5.81 MIL/uL   Hemoglobin 18.3 (H) 13.0 - 17.0 g/dL   HCT 79.8 (H) 92.1 - 19.4 %    Comment: REPEATED TO VERIFY AGREES WITH PREVIOUS RESULTS    MCV 85.8 80.0 - 100.0 fL   MCH 29.1 26.0 - 34.0 pg   MCHC 34.0 30.0 - 36.0 g/dL   RDW 17.4 08.1 - 44.8 %   Platelets 356 150 - 400 K/uL   nRBC 0.0 0.0 - 0.2 %   Neutrophils Relative % 81 %   Neutro Abs 10.1 (H) 1.7 - 7.7 K/uL   Lymphocytes Relative 11 %   Lymphs Abs 1.4 0.7 - 4.0 K/uL   Monocytes Relative 7 %   Monocytes Absolute 0.8 0.1 - 1.0 K/uL   Eosinophils Relative 0 %   Eosinophils Absolute 0.0 0.0 - 0.5 K/uL   Basophils Relative 0 %   Basophils Absolute 0.0 0.0 - 0.1 K/uL   Immature Granulocytes 1 %   Abs Immature Granulocytes 0.09 (H) 0.00 - 0.07 K/uL    Comment: Performed at [facility], [address] [place], [place] [address]  Glucose, capillary     Status: Abnormal   Collection Time: 01/11/19  6:27 AM  Result Value Ref Range   Glucose-Capillary 153 (H) 70 - 99 mg/dL  Glucose, capillary     Status: Abnormal   Collection Time: 01/11/19 11:41 AM  Result Value Ref Range   Glucose-Capillary 199 (H) 70 - 99 mg/dL    Current Facility-Administered Medications   Medication Dose Route Frequency Provider Last Rate Last Dose  . 0.9 %  sodium chloride infusion   Intravenous Continuous Marcello Fennel, MD      . acetaminophen (TYLENOL) tablet 650 mg  650 mg Oral Q4H PRN Charlton Amor, PA-C   650 mg at 01/11/19 1497   Or  . acetaminophen (TYLENOL) solution 650 mg  650 mg Per Tube Q4H PRN Angiulli, Mcarthur Rossetti, PA-C       Or  . acetaminophen (TYLENOL) suppository 650 mg  650 mg Rectal Q4H PRN Angiulli, Mcarthur Rossetti, PA-C      . amLODipine (NORVASC) tablet 5 mg  5 mg Oral Daily Kirsteins, Victorino Sparrow, MD   5 mg at 01/11/19 0859  . ARIPiprazole (ABILIFY) tablet 5 mg  5 mg Oral Daily Charlton Amor, PA-C   5 mg at 01/11/19 0859  . bisacodyl (DULCOLAX) suppository 10 mg  10 mg Rectal Daily PRN Erick Colace, MD   10 mg at 01/11/19 1141  . cloNIDine (CATAPRES) tablet 0.1 mg  0.1 mg Oral Q4H PRN Erick Colace, MD   0.1 mg at 01/06/19 0534  . feeding supplement (ENSURE ENLIVE) (ENSURE ENLIVE) liquid 237 mL  237 mL Oral BID BM Kirsteins, Victorino Sparrow, MD   237 mL at 01/11/19 1356  . insulin aspart (novoLOG) injection 0-5 Units  0-5 Units Subcutaneous QHS Patel, Maryln Gottron, MD      . insulin aspart (novoLOG) injection 0-9 Units  0-9 Units Subcutaneous TID WC Marcello Fennel, MD  2 Units at 01/11/19 1357  . lidocaine (XYLOCAINE) 2 % jelly 1 application  1 application Urethral PRN Kirsteins, Victorino Sparrow, MD      . lisinopril (PRINIVIL,ZESTRIL) tablet 20 mg  20 mg Oral Daily Charlton Amor, PA-C   20 mg at 01/11/19 0859  . mirtazapine (REMERON) tablet 7.5 mg  7.5 mg Oral QHS Patel, Maryln Gottron, MD      . ondansetron (ZOFRAN-ODT) disintegrating tablet 4 mg  4 mg Oral Q6H PRN Erick Colace, MD   4 mg at 01/11/19 0900  . pantoprazole (PROTONIX) EC tablet 40 mg  40 mg Oral QHS Charlton Amor, PA-C   40 mg at 01/10/19 2118  . polyethylene glycol (MIRALAX / GLYCOLAX) packet 17 g  17 g Oral BID Marcello Fennel, MD   17 g at 01/11/19 0859  .  senna-docusate (Senokot-S) tablet 2 tablet  2 tablet Oral QHS Marcello Fennel, MD   2 tablet at 01/10/19 2117  . sorbitol 70 % solution 30 mL  30 mL Oral Daily PRN Charlton Amor, PA-C   30 mL at 01/10/19 2043  . tamsulosin (FLOMAX) capsule 0.4 mg  0.4 mg Oral QHS AngiulliMcarthur Rossetti, PA-C   0.4 mg at 01/10/19 2118  . venlafaxine XR (EFFEXOR-XR) 24 hr capsule 300 mg  300 mg Oral Q breakfast AngiulliMcarthur Rossetti, PA-C   300 mg at 01/11/19 0900    Musculoskeletal: Strength & Muscle Tone: Left sided weakness. Gait & Station: unable to stand Patient leans: N/A  Psychiatric Specialty Exam: Physical Exam  Nursing note and vitals reviewed. Constitutional: He is oriented to person, place, and time. He appears well-developed and well-nourished.  HENT:  Head: Normocephalic and atraumatic.  Neck: Normal range of motion.  Respiratory: Effort normal.  Musculoskeletal: Normal range of motion.  Neurological: He is alert and oriented to person, place, and time.  Psychiatric: His speech is normal and behavior is normal. Judgment and thought content normal. Cognition and memory are normal. He exhibits a depressed mood.    Review of Systems  Gastrointestinal: Positive for abdominal pain and nausea.  Musculoskeletal: Positive for back pain.  Psychiatric/Behavioral: Positive for depression. Negative for hallucinations, substance abuse and suicidal ideas.  All other systems reviewed and are negative.   Blood pressure 139/88, pulse 93, temperature 98.2 F (36.8 C), temperature source Oral, resp. rate 18, height  (1.854 m), weight 95.8 kg, SpO2 97 %.Body mass index is 27.86 kg/m.  General Appearance: Fairly Groomed, elderly, Caucasian male who is wearing casual clothes and sitting in a wheelchair. NAD.   Eye Contact:  Good  Speech:  Clear and Coherent and Normal Rate  Volume:  Normal  Mood:  Depressed  Affect:  Constricted  Thought Process:  Goal Directed, Linear and Descriptions of  Associations: Intact  Orientation:  Full (Time, Place, and Person)  Thought Content:  Logical  Suicidal Thoughts:  No  Homicidal Thoughts:  No  Memory:  Immediate;   Good Recent;   Good Remote;   Good  Judgement:  Fair  Insight:  Fair  Psychomotor Activity:  Normal  Concentration:  Concentration: Good and Attention Span: Good  Recall:  Good  Fund of Knowledge:  Good  Language:  Good  Akathisia:  No  Handed:  Right  AIMS (if indicated):   N/A  Assets:  Communication Skills Desire for Improvement Financial Resources/Insurance Intimacy Resilience Social Support  ADL's:  Impaired  Cognition:  WNL  Sleep:   Poor  Assessment:  Patrick Pollard is a 73 y.o. male who was admitted to CIR following hospitalization for right thalamic hemorrhage with IVH secondary to hypertensive crisis. He endorses depressed mood and feeling of frustration secondary to medical condition. He denies SI, HI or AVH. Would not recommend further medication changes at this time since Abilify was recently increased and has potential for further mood stabilization effect. Remeron will be started tonight for poor appetite and insomnia. Patient will likely benefit from outpatient therapy to develop healthy coping skills to manage acute lifestyle changes secondary to stroke.   Treatment Plan Summary: -Continue Abilify 5 mg daily for mood augmentation. Could consider increasing Abilify to 7.5 mg daily in the near future if mood does not improve at current dose. Would hold at current dose for an additional 1-2 weeks prior to further increase.   -Agree with starting Remeron 7.5 mg qhs for poor appetite/insomnia.  -Continue Effexor XR 300 mg daily for depression.  -EKG reviewed and QTc 439 on 3/24. Please closely monitor when starting or increasing QTc prolonging agents.  -Please have SW provide patient with outpatient therapy resources.  -Patient will continue to follow up with outpatient psychiatrist.  -Psychiatry will  sign off on patient at this time. Please consult psychiatry again as needed.    Disposition: No evidence of imminent risk to self or others at present.    Cherly Beach, DO 01/11/2019 2:14 PM

## 2019-01-11 NOTE — Progress Notes (Addendum)
Physical Therapy Session Note  Patient Details  Name: Patrick Pollard MRN: [id number] Date of Birth: [birth year]  Today's Date: 01/11/2019 PT Individual Time: 1400-1515 PT Individual Time Calculation (min): 75 min   Short Term Goals: Week 1:  PT Short Term Goal 1 (Week 1): Pt will perform bed mobility with max assist of 1 consistently  PT Short Term Goal 2 (Week 1): Pt Will trasnfer to WC with max assist + 2  PT Short Term Goal 3 (Week 1): Pt will propell WC x 4ft with min assist  PT Short Term Goal 4 (Week 1): Pt will perform sit<>stand with max assist and LRAD   Skilled Therapeutic Interventions/Progress Updates:   Pt in w/c and agreeable to therapy, no c/o pain but c/o increased soreness from prolonged sitting/laying supine. Max-total assist x1 supine>sit and slide board transfer to TIS. Pt appropriately initiating set-up of slide board transfer w/o prompting from therapist. Max manual and tactile cues for lateral and anterior weight shifting during transfer for improved head/hips relationship. Assisted w/ repositioning in TIS, total assist. Transported to outside w/ total assist in TIS. Worked on postural control in seated w/o UE and back support while tossing horseshoes. Required pt to reach towards edge of BOS for horseshoes w/ mod-max assist for dynamic sitting balance, and anticipatory postural control w/ tossing horseshoes. Tactile and verbal cues throughout to maintain midline orientation during static rest and when returning to upright sitting from reaching. Returned to unit and pt requesting to toilet. Stedy transfer to/from [facility] w/ max assist +2 to stand and total assist +2 for pericare and LE garment management. Pt able to self-correct L lateral lean in standing and unsupported sitting, but needs constant multimodal cues. Pt w/ trace continent BM, made RN aware. Returned to [facility] and transported to day room to assist w/ uplifting pt's mood. Ended session tilted in TIS and chair alarm  engaged. In care of psychiatry MD who will let RN know when she is finished.   Therapy Documentation Precautions:  Precautions Precautions: Fall Precaution Comments: L hemi, SbP <160 Restrictions Weight Bearing Restrictions: No Pain:    Therapy/Group: Individual Therapy  Lyndel Sarate Melton Krebs 01/11/2019, 3:24 PM

## 2019-01-12 ENCOUNTER — Inpatient Hospital Stay (HOSPITAL_COMMUNITY): Payer: Self-pay | Admitting: Physical Therapy

## 2019-01-12 DIAGNOSIS — F331 Major depressive disorder, recurrent, moderate: Secondary | ICD-10-CM

## 2019-01-12 LAB — BASIC METABOLIC PANEL
Anion gap: 11 (ref 5–15)
BUN: 31 mg/dL — ABNORMAL HIGH (ref 8–23)
CO2: 22 mmol/L (ref 22–32)
Calcium: 9.4 mg/dL (ref 8.9–10.3)
Chloride: 93 mmol/L — ABNORMAL LOW (ref 98–111)
Creatinine, Ser: 0.88 mg/dL (ref 0.61–1.24)
GFR calc Af Amer: >60 mL/min (ref >60)
GFR calc non Af Amer: >60 mL/min (ref >60)
Glucose, Bld: 149 mg/dL — ABNORMAL HIGH (ref 70–99)
Potassium: 4.4 mmol/L (ref 3.5–5.1)
Sodium: 126 mmol/L — ABNORMAL LOW (ref 135–145)

## 2019-01-12 LAB — GLUCOSE, CAPILLARY
Glucose-Capillary: 171 mg/dL — ABNORMAL HIGH (ref 70–99)
Glucose-Capillary: 161 mg/dL — ABNORMAL HIGH (ref 70–99)
Glucose-Capillary: 172 mg/dL — ABNORMAL HIGH (ref 70–99)
Glucose-Capillary: 208 mg/dL — ABNORMAL HIGH (ref 70–99)
Glucose-Capillary: 163 mg/dL — ABNORMAL HIGH (ref 70–99)

## 2019-01-12 NOTE — Progress Notes (Signed)
Physical Therapy Weekly Progress Note  Patient Details  Name: Patrick Pollard MRN: [id number] Date of Birth: 08/26/1946  Beginning of progress report period: January 05, 2019 End of progress report period: January 12, 2019  Today's Date: 01/12/2019 PT Individual Time: 0902-0949 PT Individual Time Calculation (min): 47 min   Patient has met 2 of 4 short term goals. Patient making progress with therapy; however, has not yet performed w/c mobility or sit<>stand transfers without use of device. Pt has progressed from performing bed mobility with 2 helpers and lateral scoot transfers with +2 max assist to now performing supine<>sit with max assist of 1 and lateral scoot transfers with mod assist of 2. Pt continues to demonstrate impaired L hemibody strength and sensation, impaired trunk control/sitting balance,  Impaired midline orientation, and impaired activity tolerance.  Patient continues to demonstrate the following deficits muscle weakness and muscle paralysis, decreased cardiorespiratoy endurance, impaired timing and sequencing, abnormal tone, unbalanced muscle activation and decreased motor planning, decreased midline orientation and decreased attention to left, decreased initiation, decreased attention, decreased awareness, decreased problem solving, decreased safety awareness and delayed processing and decreased sitting balance, decreased standing balance, decreased postural control, hemiplegia and decreased balance strategies and therefore will continue to benefit from skilled PT intervention to increase functional independence with mobility.  Patient progressing toward long term goals..  Continue plan of care.  PT Short Term Goals Week 1:  PT Short Term Goal 1 (Week 1): Pt will perform bed mobility with max assist of 1 consistently  PT Short Term Goal 1 - Progress (Week 1): Met PT Short Term Goal 2 (Week 1): Pt Will trasnfer to WC with max assist + 2  PT Short Term Goal 2 - Progress (Week 1):  Met PT Short Term Goal 3 (Week 1): Pt will propell WC x 32f with min assist  PT Short Term Goal 3 - Progress (Week 1): Not met PT Short Term Goal 4 (Week 1): Pt will perform sit<>stand with max assist and LRAD  PT Short Term Goal 4 - Progress (Week 1): Not met Week 2:  PT Short Term Goal 1 (Week 2): Pt will perform supine<>sit with no more than mod assist of 1 PT Short Term Goal 2 (Week 2): Pt will perform bed<>chair transfers using LRAD with no more than mod assist of 1 PT Short Term Goal 3 (Week 2): Patient will perform sit<>stand with +2 maximal assistance PT Short Term Goal 4 (Week 2): Pt will maintain seated midline orientation for at least 1 minute with supervision  Skilled Therapeutic Interventions/Progress Updates:  Ambulation/gait training;Balance/vestibular training;Cognitive remediation/compensation;Community reintegration;Discharge planning;Disease management/prevention;DME/adaptive equipment instruction;Functional electrical stimulation;Functional mobility training;Neuromuscular re-education;Patient/family education;Pain management;Psychosocial support;Skin care/wound management;Splinting/orthotics;Stair training;Therapeutic Activities;Therapeutic Exercise;UE/LE Coordination activities;UE/LE Strength taining/ROM;Visual/perceptual remediation/compensation;Wheelchair propulsion/positioning   Pt received supine in bed and agreeable to therapy session reporting he now has no pain in his neck or back and he associates that with the fact that he was able to have a BM. Pt performed supine to sit with max assist of 1 for L LE management and trunk upright with cuing throughout for sequencing of logroll technique for increased pt independence and use of R UE to assist with trunk control. Pt able to verbalize proper placement of transfer board to prepare for lateral scoot transfer - therapist placed board. Pt performed R lateral scoot from bed to w/c with mod assist of 2 for scooting hips and for  trunk control to prevent L lateral trunk LOB - cuing throughout for sequencing. Performed L lateral  scoot w/c to EOM with mod assist of 2 as described above. Pt performed seated balance task on EOM using visual mirror feedback with initial max assist to prevent L posterolateral trunk LOB progressed to pt able to maintain midline orientation for 30 seconds with close supervision - therapist provided maximal visual, verbal, and tactile cuing for improved midline orientation and upright posture. Progressed to dynamic sitting balance task of reaching with R UE toward high external target to promote R weight shift, R trunk lengthening, and to challenge pt's trunk limits of stability - close guarding to min assist provided to prevent LOB and 1 instance of max assist for recovery due to L lateral trunk LOB. Performed R lateral scoot transfer EOM to w/c with pt verbalizing proper placement of transfer board and placing it with min assist and mod assist of 2 for lateral scooting. Pt performed 2x sit<>stand in standing frame with pt demonstrating left lateral and anterior trunk lean with R lateral hip posture and L knee positioning in a curved posture with maximal verbal and tactile cuing for improved midline trunk orientation and manual facilitation for improved hip and knee positioning - pt unable to sustain trunk in midline during facilitation of LE positioning resulting in pt's trunk leaning L and anterior - pt also demonstrated R UE pushing in standing requiring therapist to hold UE off table to improve midline orientation. Pt seated in w/c and transported back to room. Pt left sitting, reclined in TIS w/c with needs in reach and seat belt on.  Therapy Documentation Precautions:  Precautions Precautions: Fall Precaution Comments: L hemi, SbP <160 Restrictions Weight Bearing Restrictions: No  Pain: Patient denies pain throughout therapy session.  Therapy/Group: Individual Therapy  Tawana Scale, PT,  DPT 01/12/2019, 12:41 PM

## 2019-01-12 NOTE — Progress Notes (Signed)
[facility] PHYSICAL MEDICINE & REHABILITATION PROGRESS NOTE   Subjective/Complaints: Patient seen laying in bed this morning.  He states he slept well overnight.  He states he feels much better this morning.  Sleep confirmed with sleep chart.  ROS: Denies CP, shortness of breath, nausea, vomiting, diarrhea.  Objective:   Ct Abdomen Pelvis Wo Contrast  Result Date: 01/10/2019 CLINICAL DATA:  Nausea, constipation. EXAM: CT ABDOMEN AND PELVIS WITHOUT CONTRAST TECHNIQUE: Multidetector CT imaging of the abdomen and pelvis was performed following the standard protocol without IV contrast. COMPARISON:  CT scan of December 31, 2017. FINDINGS: Lower chest: Left lower lobe atelectasis or pneumonia is noted. Hepatobiliary: No focal liver abnormality is seen. No gallstones, gallbladder wall thickening, or biliary dilatation. Pancreas: Unremarkable. No pancreatic ductal dilatation or surrounding inflammatory changes. Spleen: Normal in size without focal abnormality. Adrenals/Urinary Tract: Adrenal glands are unremarkable. Kidneys are normal, without renal calculi, focal lesion, or hydronephrosis. Bladder is unremarkable. Stomach/Bowel: The stomach appears normal. There is no evidence of bowel obstruction or inflammation. The appendix is not visualized. Vascular/Lymphatic: Aortic atherosclerosis. No enlarged abdominal or pelvic lymph nodes. Reproductive: Stable mild prostatic enlargement is noted. Other: No abdominal wall hernia or abnormality. No abdominopelvic ascites. Musculoskeletal: No acute or significant osseous findings. IMPRESSION: Left lower lobe atelectasis or pneumonia. No acute abnormality seen in the abdomen or pelvis. Stable mild prostatic enlargement. Aortic Atherosclerosis (ICD10-I70.0). Electronically Signed   By: Lupita Raider, M.D.   On: 01/10/2019 19:33   Recent Labs    01/11/19 0535  WBC 12.5*  HGB 18.3*  HCT 53.9*  PLT 356   Recent Labs    01/11/19 0535 01/12/19 0529  NA 125* 126*   K 4.8 4.4  CL 91* 93*  CO2 24 22  GLUCOSE 161* 149*  BUN 39* 31*  CREATININE 0.89 0.88  CALCIUM 9.9 9.4    Intake/Output Summary (Last 24 hours) at 01/12/2019 0942 Last data filed at 01/12/2019 0700 Gross per 24 hour  Intake 947.21 ml  Output 1500 ml  Net -552.79 ml     Physical Exam: Vital Signs Blood pressure (!) 146/90, pulse 93, temperature 98.2 F (36.8 C), temperature source Oral, resp. rate 20, height 6\' 1"  (1.854 m), weight 95.8 kg, SpO2 98 %. Constitutional: No distress . Vital signs reviewed. HENT: Normocephalic.  Atraumatic. Eyes: EOMI. No discharge. Cardiovascular: RRR.  No JVD. Respiratory: CTA bilaterally.  Normal effort. GI: BS +. Non-distended. Musc: No edema or tenderness in extremities. Neuro: Alert Motor:  Left upper extremity: Approximately 0/5, handgrip 2+/5 Left lower extremity: 2/5 hip flexion, 0/5 distally Right lower extremity: 5/5 proximal to distal Skin: Warm and dry.  Intact.  Assessment/Plan: 1. 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  Physiatrist is providing close team supervision and 24 hour management of active medical problems listed below.  Physiatrist and rehab team continue to assess barriers to discharge/monitor patient progress toward functional and medical goals  Care Tool:  Bathing    Body parts bathed by patient: Left arm, Chest, Abdomen, Right upper leg, Left upper leg, Right lower leg, Face   Body parts bathed by helper: Buttocks, Front perineal area, Right arm, Left lower leg Body parts n/a: Left lower leg   Bathing assist Assist Level: 2 Helpers     Upper Body Dressing/Undressing Upper body dressing   What is the patient wearing?: Pull over shirt    Upper body assist  Assist Level: Maximal Assistance - Patient 25 - 49%    Lower Body Dressing/Undressing Lower body dressing      What is the patient  wearing?: Incontinence brief, Pants     Lower body assist Assist for lower body dressing: 2 Helpers     Toileting Toileting    Toileting assist Assist for toileting: 2 Helpers(in and out cath)     Transfers Chair/bed transfer  Transfers assist  Chair/bed transfer activity did not occur: Safety/medical concerns  Chair/bed transfer assist level: 2 Helpers(slide board and then stedy )     Locomotion Ambulation   Ambulation assist   Ambulation activity did not occur: Safety/medical concerns          Walk 10 feet activity   Assist  Walk 10 feet activity did not occur: Safety/medical concerns        Walk 50 feet activity   Assist Walk 50 feet with 2 turns activity did not occur: Safety/medical concerns         Walk 150 feet activity   Assist Walk 150 feet activity did not occur: Safety/medical concerns         Walk 10 feet on uneven surface  activity   Assist Walk 10 feet on uneven surfaces activity did not occur: Safety/medical concerns         Wheelchair     Assist     Wheelchair activity did not occur: Safety/medical concerns         Wheelchair 50 feet with 2 turns activity    Assist    Wheelchair 50 feet with 2 turns activity did not occur: Safety/medical concerns       Wheelchair 150 feet activity     Assist Wheelchair 150 feet activity did not occur: Safety/medical concerns        Medical Problem List and Plan: 1.Left side weakness with facial droopas well as left hemi-sensory losssecondary to right thalamic hemorrhage with IVH secondary to hypertensive crisis  Continue CIR Left WHO and PRAFO ordered, have discussed donning with patient and nursing 2. Antithrombotics: -DVT/anticoagulation:SCDs -antiplatelet therapy: N/A 3. Pain Management:Tylenol as needed 4. Mood:Abilify 5 mg daily, Effexor 300 mg daily. Provide emotional support -antipsychotic agents:  N/A 5. Neuropsych: This patientiscapable of making decisions on hisown behalf. 6. Skin/Wound Care:Routine skin checks 7. Fluids/Electrolytes/Nutrition:Routine ins and outs   Poor p.o. intake- Remeron started on 4/3   Improving 8.  Essential hypertension  Cont Lisinopril 20 mg daily,   Maxide 37.5-25 mg daily DC'd on 4/4 due to hyponatremia  Monitor with increased mobility Vitals:   01/11/19 2009 01/12/19 0459  BP: (!) 144/95 (!) 146/90  Pulse: 100 93  Resp: 18 20  Temp: 98.9 F (37.2 C) 98.2 F (36.8 C)  SpO2: 95% 98%   Amlodipine increased to 5mg  on 3/29  Remains slight elevated on 4/4, will consider further increase in medications tomorrow 9. BPH. Flomax 0.4 mg daily, removed  Foley  10. History of GI bleed . Continue Protonix 11. Hyperlipidemia. Resume Lipitor on discharge. 12.  Constipation-bowel meds increased on 3/30, again on 3/31, again on 4/1  Extensive discussion with wife who explained that patient had a intra-abdominal mass that was?  That caused significant constipation and was resected and was to have a follow-up colonoscopy prior to being admitted in the hospital  CT abd/pelvis reviewed, relatively unremarkable 13.  Prediabetes  Diet changed to carb modified   SSI ordered on 4/2  Labile and elevated on 4/4, will consider medications if  persistently elevated 14.  Hyponatremia  Sodium 126 on 4/4  Labs ordered for Monday  Continue to monitor 15.  Sleep disturbance  ECG on 3/24 reviewed, trazodone 50 started on 3/30, DC'd on 4/3  Remeron started on 4/3  Sleep chart ordered 16.  AKI  Creatinine 0.88 on 4/4  IVF nightly started on 4/3-echo reviewed, EF stable  Encourage fluids  Labs ordered for Monday 17.  Leukocytosis-possibly hemoconcentrated  WBCs 12.5 on 4/3  Labs ordered for Monday  Afebrile  Continue to monitor  LOS: 8 days A FACE TO FACE EVALUATION WAS PERFORMED  Natashia Roseman Karis Juba 01/12/2019, 9:42 AM

## 2019-01-13 ENCOUNTER — Inpatient Hospital Stay (HOSPITAL_COMMUNITY): Payer: Self-pay

## 2019-01-13 LAB — GLUCOSE, CAPILLARY
Glucose-Capillary: 146 mg/dL — ABNORMAL HIGH (ref 70–99)
Glucose-Capillary: 143 mg/dL — ABNORMAL HIGH (ref 70–99)
Glucose-Capillary: 131 mg/dL — ABNORMAL HIGH (ref 70–99)
Glucose-Capillary: 192 mg/dL — ABNORMAL HIGH (ref 70–99)

## 2019-01-13 MED ORDER — SODIUM CHLORIDE 0.9 % IV SOLN
INTRAVENOUS | Status: AC
  Administered 2019-01-13: 17:00:00 via INTRAVENOUS

## 2019-01-13 NOTE — Progress Notes (Signed)
Physical Therapy Session Note  Patient Details  Name: Patrick Pollard MRN: [id number] Date of Birth: Nov 16, 1945  Today's Date: 01/13/2019 PT Individual Time: 1015-1115 PT Individual Time Calculation (min): 60 min   Short Term Goals: Week 2:  PT Short Term Goal 1 (Week 2): Pt will perform supine<>sit with no more than mod assist of 1 PT Short Term Goal 2 (Week 2): Pt will perform bed<>chair transfers using LRAD with no more than mod assist of 1 PT Short Term Goal 3 (Week 2): Patient will perform sit<>stand with +2 maximal assistance PT Short Term Goal 4 (Week 2): Pt will maintain seated midline orientation for at least 1 minute with supervision  Skilled Therapeutic Interventions/Progress Updates:    Pt supine in bed upon PT arrival, agreeable to therapy tx and denies pain. Pt asleep, arouses to verbal/tactile stimuli. Pt transferred to sitting EOB with max assist, cues for techniques. Pt with improved sitting balance and midline awareness this session, able to maintain seated balance with supervision-CGA. Pt performed slideboard transfer to the R with max assist +1. Pt transported to the gym in TIS w/c total assist. Pt performed slideboard transfer to the mat max assist. Pt worked on sitting balance with supervision and verbal cues to perform R lateral reaching activity, mirror for visual feedback. Pt performed sit<>stand from mat within stedy, max assist and performed x 3 sit<>stands from elevated stedy seat with mod assist this session. Within the stedy pt worked on standing balance with and without UE support, mirror for visual feedback, wall on pt's R side for vertical orientation and cues to lean towards wall, pt performed reaching tasks in standing, min-mod assist for standing balance. Pt transferred to w/c with stedy. Pt performed x 3 sit<>stands this session using the R rail in hallway with max assist. In standing at the rail pt worked on standing balance with mirror for visual feedback and  pre-gait stepping in place with R LE while therapist blocked L LE. During standing at the rail, therapist providing verbal cues for midline and upright posture, therapist providing manual facilitation for increased hip extension and lateral weightshifting. Pt ambulated x 10 ft using R Rail for UE support with max assist +2 (second helper for w/c follow), pt requiring assist to advance L LE (DF ace wrap used) and pt requiring max assist for L knee blocking during L stance phase. Pt transported back to room at end of session and left in w/c with needs in reach encouraged pt to stay OOB until after lunch at least.   Therapy Documentation Precautions:  Precautions Precautions: Fall Precaution Comments: L hemi, SbP <160 Restrictions Weight Bearing Restrictions: No    Therapy/Group: Individual Therapy  Cresenciano Genre, PT, DPT 01/13/2019, 7:58 AM

## 2019-01-13 NOTE — Progress Notes (Signed)
[place] PHYSICAL MEDICINE & REHABILITATION PROGRESS NOTE   Subjective/Complaints: Patient seen laying in bed this morning.  He states he slept well overnight, confirmed with sleep chart.  He states he feels better.  He denies complaints.  Patient requests that I speak to his wife.  When I informed him that I previously spoke to her and asked if she wanted to speak to me again or he was referring to the previous conversation, patient states he was referring to the previous conversation.  ROS: Denies CP, shortness of breath, nausea, vomiting, diarrhea.  Objective:   No results found. Recent Labs    01/11/19 0535  WBC 12.5*  HGB 18.3*  HCT 53.9*  PLT 356   Recent Labs    01/11/19 0535 01/12/19 0529  NA 125* 126*  K 4.8 4.4  CL 91* 93*  CO2 24 22  GLUCOSE 161* 149*  BUN 39* 31*  CREATININE 0.89 0.88  CALCIUM 9.9 9.4    Intake/Output Summary (Last 24 hours) at 01/13/2019 0829 Last data filed at 01/12/2019 2318 Gross per 24 hour  Intake 120 ml  Output 1050 ml  Net -930 ml     Physical Exam: Vital Signs Blood pressure 125/85, pulse 90, temperature 98.2 F (36.8 C), resp. rate 18, height 6\' 1"  (1.854 m), weight 95.8 kg, SpO2 93 %. Constitutional: NAD.  Vital signs reviewed.  Well-developed. HENT: Normocephalic.  Atraumatic. Eyes: EOMI. No discharge. Cardiovascular: No JVD. Respiratory: Normal effort. GI: BS +. Non-distended. Musc: No edema or tenderness in extremities. Neuro: Alert Dysarthria Motor:  Left upper extremity: Approximately 0/5, handgrip 2+/5, stable Left lower extremity: 1/5 hip flexion, 0/5 distally Skin: Warm and dry.  Intact.  Assessment/Plan: 1. 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  Physiatrist is providing close team supervision and 24 hour management of active medical problems listed below.  Physiatrist and rehab team  continue to assess barriers to discharge/monitor patient progress toward functional and medical goals  Care Tool:  Bathing    Body parts bathed by patient: Left arm, Chest, Abdomen, Right upper leg, Left upper leg, Right lower leg, Face   Body parts bathed by helper: Buttocks, Front perineal area, Right arm, Left lower leg Body parts n/a: Left lower leg   Bathing assist Assist Level: 2 Helpers     Upper Body Dressing/Undressing Upper body dressing   What is the patient wearing?: Pull over shirt    Upper body assist Assist Level: Maximal Assistance - Patient 25 - 49%    Lower Body Dressing/Undressing Lower body dressing      What is the patient wearing?: Incontinence brief, Pants     Lower body assist Assist for lower body dressing: 2 Helpers     Toileting Toileting    Toileting assist Assist for toileting: 2 Helpers     Transfers Chair/bed transfer  Transfers assist  Chair/bed transfer activity did not occur: Safety/medical concerns  Chair/bed transfer assist level: 2 Helpers     Locomotion Ambulation   Ambulation assist   Ambulation activity did not occur: Safety/medical concerns          Walk 10 feet activity   Assist  Walk 10 feet activity did not occur: Safety/medical concerns        Walk 50 feet activity   Assist Walk 50 feet with 2 turns activity did not occur: Safety/medical concerns  Walk 150 feet activity   Assist Walk 150 feet activity did not occur: Safety/medical concerns         Walk 10 feet on uneven surface  activity   Assist Walk 10 feet on uneven surfaces activity did not occur: Safety/medical concerns         Wheelchair     Assist     Wheelchair activity did not occur: Safety/medical concerns         Wheelchair 50 feet with 2 turns activity    Assist    Wheelchair 50 feet with 2 turns activity did not occur: Safety/medical concerns       Wheelchair 150 feet activity      Assist Wheelchair 150 feet activity did not occur: Safety/medical concerns        Medical Problem List and Plan: 1.Left side weakness with facial droopas well as left hemi-sensory losssecondary to right thalamic hemorrhage with IVH secondary to hypertensive crisis  Continue CIR Left WHO and PRAFO ordered, have discussed donning with patient and nursing 2. Antithrombotics: -DVT/anticoagulation:SCDs -antiplatelet therapy: N/A 3. Pain Management:Tylenol as needed 4. Mood:Abilify 5 mg daily, Effexor 300 mg daily. Provide emotional support -antipsychotic agents: N/A 5. Neuropsych: This patientiscapable of making decisions on hisown behalf. 6. Skin/Wound Care:Routine skin checks 7. Fluids/Electrolytes/Nutrition:Routine ins and outs   Poor p.o. intake- Remeron started on 4/3   Improving 8.  Essential hypertension  Cont Lisinopril 20 mg daily,   Maxide 37.5-25 mg daily DC'd on 4/4 due to hyponatremia  Monitor with increased mobility Vitals:   01/12/19 1940 01/13/19 0434  BP: (!) 136/97 125/85  Pulse: 94 90  Resp: 16 18  Temp: 98.1 F (36.7 C) 98.2 F (36.8 C)  SpO2: 96% 93%   Amlodipine increased to 5mg  on 3/29  Controlled on 4/5 9. BPH. Flomax 0.4 mg daily, removed  Foley  10. History of GI bleed . Continue Protonix 11. Hyperlipidemia. Resume Lipitor on discharge. 12.  Constipation-bowel meds increased on 3/30, again on 3/31, again on 4/1  Extensive discussion with wife who explained that patient had a intra-abdominal mass that was?  That caused significant constipation and was resected and was to have a follow-up colonoscopy prior to being admitted in the hospital  CT abd/pelvis reviewed, relatively unremarkable 13.  Prediabetes  Diet changed to carb modified   SSI ordered on 4/2  Overall?  Improving on 4/5 14.  Hyponatremia  Sodium 126 on 4/4  Labs ordered for tomorrow  Continue to monitor 15.  Sleep  disturbance  ECG on 3/24 reviewed, trazodone 50 started on 3/30, DC'd on 4/3  Remeron started on 4/3  Sleep chart ordered  Improving 16.  AKI  Creatinine 0.88 on 4/4  IVF nightly started on 4/3-4/5-echo reviewed, EF stable  Encourage fluids  Labs ordered for tomorrow 17.  Leukocytosis-possibly hemoconcentrated  WBCs 12.5 on 4/3  Labs ordered for tomorrow  Afebrile  Continue to monitor  LOS: 9 days A FACE TO FACE EVALUATION WAS PERFORMED   Patrick Pollard 01/13/2019, 8:29 AM

## 2019-01-14 ENCOUNTER — Inpatient Hospital Stay (HOSPITAL_COMMUNITY): Payer: Self-pay

## 2019-01-14 ENCOUNTER — Inpatient Hospital Stay (HOSPITAL_COMMUNITY): Payer: Self-pay | Admitting: Occupational Therapy

## 2019-01-14 ENCOUNTER — Encounter (HOSPITAL_COMMUNITY): Payer: Self-pay | Admitting: Psychology

## 2019-01-14 DIAGNOSIS — G8104 Flaccid hemiplegia affecting left nondominant side: Secondary | ICD-10-CM

## 2019-01-14 DIAGNOSIS — I69054 Hemiplegia and hemiparesis following nontraumatic subarachnoid hemorrhage affecting left non-dominant side: Secondary | ICD-10-CM

## 2019-01-14 DIAGNOSIS — I619 Nontraumatic intracerebral hemorrhage, unspecified: Secondary | ICD-10-CM

## 2019-01-14 HISTORY — DX: Nontraumatic intracerebral hemorrhage, unspecified: G81.04

## 2019-01-14 LAB — CBC WITH DIFFERENTIAL/PLATELET
Abs Immature Granulocytes: 0.07 10*3/uL (ref 0.00–0.07)
Basophils Absolute: 0.1 10*3/uL (ref 0.0–0.1)
Basophils Relative: 0 %
Eosinophils Absolute: 0.2 10*3/uL (ref 0.0–0.5)
Eosinophils Relative: 1 %
HCT: 47.7 % (ref 39.0–52.0)
Hemoglobin: 16.2 g/dL (ref 13.0–17.0)
Immature Granulocytes: 1 %
Lymphocytes Relative: 14 %
Lymphs Abs: 1.9 10*3/uL (ref 0.7–4.0)
MCH: 29.8 pg (ref 26.0–34.0)
MCHC: 34 g/dL (ref 30.0–36.0)
MCV: 87.8 fL (ref 80.0–100.0)
Monocytes Absolute: 1 10*3/uL (ref 0.1–1.0)
Monocytes Relative: 7 %
Neutro Abs: 10.6 10*3/uL — ABNORMAL HIGH (ref 1.7–7.7)
Neutrophils Relative %: 77 %
Platelets: 305 10*3/uL (ref 150–400)
RBC: 5.43 MIL/uL (ref 4.22–5.81)
RDW: 13 % (ref 11.5–15.5)
WBC: 13.8 10*3/uL — ABNORMAL HIGH (ref 4.0–10.5)
nRBC: 0 % (ref 0.0–0.2)

## 2019-01-14 LAB — BASIC METABOLIC PANEL
Anion gap: 9 (ref 5–15)
BUN: 22 mg/dL (ref 8–23)
CO2: 23 mmol/L (ref 22–32)
Calcium: 9.3 mg/dL (ref 8.9–10.3)
Chloride: 97 mmol/L — ABNORMAL LOW (ref 98–111)
Creatinine, Ser: 0.86 mg/dL (ref 0.61–1.24)
GFR calc Af Amer: >60 mL/min (ref >60)
GFR calc non Af Amer: >60 mL/min (ref >60)
Glucose, Bld: 213 mg/dL — ABNORMAL HIGH (ref 70–99)
Potassium: 4.2 mmol/L (ref 3.5–5.1)
Sodium: 129 mmol/L — ABNORMAL LOW (ref 135–145)

## 2019-01-14 LAB — GLUCOSE, CAPILLARY
Glucose-Capillary: 120 mg/dL — ABNORMAL HIGH (ref 70–99)
Glucose-Capillary: 189 mg/dL — ABNORMAL HIGH (ref 70–99)
Glucose-Capillary: 122 mg/dL — ABNORMAL HIGH (ref 70–99)
Glucose-Capillary: 146 mg/dL — ABNORMAL HIGH (ref 70–99)

## 2019-01-14 NOTE — Progress Notes (Signed)
Physical Therapy Session Note  Patient Details  Name: Patrick Pollard MRN: [id number] Date of Birth: 1946/02/20  Today's Date: 01/14/2019 PT Individual Time: 7253-6644 PT Individual Time Calculation (min): 68 min   Short Term Goals: Week 2:  PT Short Term Goal 1 (Week 2): Pt will perform supine<>sit with no more than mod assist of 1 PT Short Term Goal 2 (Week 2): Pt will perform bed<>chair transfers using LRAD with no more than mod assist of 1 PT Short Term Goal 3 (Week 2): Patient will perform sit<>stand with +2 maximal assistance PT Short Term Goal 4 (Week 2): Pt will maintain seated midline orientation for at least 1 minute with supervision  Skilled Therapeutic Interventions/Progress Updates:    Pt supine in bed upon PT arrival, agreeable to therapy tx and denies pain. Pt talking with family on video chat upon PT arrival, therapist provided education to family regarding pt progress and d/c planning. Pt transferred to sitting EOB with max assist with cues for techniques. Pt performed slideboard transfer to the TIS w/c with max assist and cues for techniques, transported to the gym. Slideboard transfer to the mat with max assist and cues for techniques/head hips relationship. Pt performed sit<>stand from mat within stedy, max assist and cues for techniques. Throughout the session pt performed sit<>stands x6 from elevated stedy seat with mod assist, cues for techniques and mirror for visual feedback of midline. Within the stedy pt worked on standing balance with and without UE support, mirror for visual feedback, wall on pt's R side for vertical orientation and cues to lean towards wall, pt performed reaching tasks in standing, min-mod assist for standing balance. Pt transferred to w/c with stedy. X3 sit<>stands from elevated stedy seat with emphasis on maintaining midline throughout task, cues to touch R fingertips to R wall to limit pushing, mod assist. Pt reports feeling very tired this afternoon,  ready to go back to his room. Pt transferred to w/c with stedy. Pt worked on L hand grip, pt able to move fingers and gently squeeze. Also worked on L active assisted biceps activation for neuro re-ed. Pt transported back to room and left seated in TIS with needs in reach and chair alarm set.   Therapy Documentation Precautions:  Precautions Precautions: Fall Precaution Comments: L hemi, SbP <160 Restrictions Weight Bearing Restrictions: No   Therapy/Group: Individual Therapy  Cresenciano Genre, PT, DPT 01/14/2019, 7:56 AM

## 2019-01-14 NOTE — Consult Note (Signed)
Neuropsychological Consultation   Patient:   Patrick Pollard   DOB:   Oct 26, 1945  MR Number:  [id number]  Location:  [facility] [address] CENTER A [id number] [place] [address] Dept: [phone] Loc: [phone]           Date of Service:   01/14/2019  Start Time:   9 AM End Time:   10 AM  Provider/Observer:  Arley Phenix, Psy.D.       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  The patient has been maintained on Abilify, Effexor and Wellbutrin in the past but the patient reports that he is not taking the Wellbutrin now.  The patient presented on 01/01/2019 with acute onset of left-sided weakness, mild dysarthria and facial droop.  The patient reports that he was at a stranger's house looking to buy something and had gone into the bathroom.  It was at this time that he began having the symptoms and required assistance and the other individual called 911.  The patient's blood pressure 155 systolic upon arrival at the ED.  Cranial CT scan showed acute hemorrhage in the right lateral thalamus/posterior limb internal capsule with mild surrounding edema.  The patient has a strong history of significant depression but denies it worsening with this acute event.  The patient was referred for and is been admitted into the comprehensive inpatient rehabilitation program due to functional deficits primarily related to left-sided motor deficits.  01/14/2019:  The patient had reported indications that he either wanted to die or was frustrated and did not care if he died.  Lengthy discussion with patient today, he reports that he did not have any intention of trying to harm himself.  He reports that  his pain, which has been improving, loss of motor function, and being asked over and over how he was doing lead him to make the comments he did this past Friday.  He denied SI but was feeling frustrated by pain etc.  Patient was instructed how to communicate any feelings of SI if they were to develop.    Reason for Service:  The patient was referred for neuropsychological consultation due to adjustment and coping issues.  Below is the HPI for the current admission.  TRV:[id number] Hubbard is a 73 year old right-handed male with past history of GI bleed due to polyp status post resection 2018, hypertension maintained on Maxide, depression continues with Abilify, Effexor as well as Wellbutrin.per chart review patient lives with spouse. One level home with ramped entrance. Independent prior to admission. Presented 01/01/2019 with acute onset of left-sided weakness, mild dysarthria and facial droop while up to the bathroom. Blood pressure 155 systolic upon arrival to the ED. Denied any chest pain or shortness of breath. Cranial CT scan showed a 3.1 x 2.6 x 2.3 cm acute hemorrhage in the right lateral thalamus/posterior limb internal capsule mild surrounding edema. CT angiogram of head and neck negative for vascular malformation. 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 Echocardiogram with ejection fraction of 60%. Systolic function normal. Placed on Cardene for blood pressure control. Tolerating a regular diet. Therapy evaluations completed with recommendations of physical medicine rehabilitation consult. Patient was admitted for a comprehensive rehabilitation program.  Current Status:  The patient  denies a severe or significant worsening of his depressive symptoms but does report that it is been very stressful and difficult for him to cope with his current status.  He does maintain a positive outlook and is expecting continued improvement in the improvements that he has made over the past couple of days  are quite motivating for him.  The patient reports that he had been experiencing significant pain particularly in the evening but this is gotten much better over the past day or 2.  He reports that that has allowed his situation to be much more manageable.   Behavioral Observation: [name]  presents as a 73 y.o.-year-old Right Caucasian Male who appeared his stated age. his dress was Appropriate and he was Well Groomed and his manners were Appropriate to the situation.  his participation was indicative of Appropriate and Attentive behaviors.  There were any physical disabilities noted.  he displayed an appropriate level of cooperation and motivation.     Interactions:    Active Appropriate and Attentive  Attention:   within normal limits and attention span and concentration were age appropriate  Memory:   within normal limits; recent and remote memory intact  Visuo-spatial:  not examined  Speech (Volume):  low  Speech:   slurred; garbled  Thought Process:  Coherent and Relevant  Though Content:  WNL; not suicidal and not homicidal  Orientation:   person, place, time/date and situation  Judgment:   Good  Planning:   Good  Affect:    Appropriate  Mood:    Dysphoric  Insight:   Good  Intelligence:   high  Medical History:   Past Medical History:  Diagnosis Date  . Anxiety   . Colon polyps    s/p colonoscopy 12/2016 wtih polyp removal   . Depression    Psychiatric History:  The patient has a long history of significant major depressive events without psychotic feature.  He has maintained on Effexor and Abilify and has taken Wellbutrin at times.  Family Med/Psych History: History reviewed. No pertinent family history.  Risk of Suicide/Violence: low the patient denies any suicidal or homicidal ideation.  Impression/DX:  Patrick Pollard is a 73 year old right-handed male with a history of GI bleed in 2018, hypertension, and recurrent major depressive episodes with  severe events without psychotic features.  The patient has been maintained on Abilify, Effexor and Wellbutrin in the past but the patient reports that he is not taking the Wellbutrin now.  The patient presented on 01/01/2019 with acute onset of left-sided weakness, mild dysarthria and facial droop.  The patient reports that he was at a stranger's house looking to buy something and had gone into the bathroom.  It was at this time that he began having the symptoms and required assistance and the other individual called 911.  The patient's blood pressure 155 systolic upon arrival at the ED.  Cranial CT scan showed acute hemorrhage in the right lateral thalamus/posterior limb internal capsule with mild surrounding edema.  The patient has a strong history of significant depression but denies it worsening with this acute event.  The patient was referred for and is been admitted into the comprehensive inpatient rehabilitation program due to functional deficits primarily related to left-sided motor deficits.  The patient denies a severe or significant worsening of his depressive symptoms but does report that it is been very stressful and difficult for him to cope with his current status.  He does maintain a positive outlook and is expecting  continued improvement in the improvements that he has made over the past couple of days are quite motivating for him.  The patient reports that he had been experiencing significant pain particularly in the evening but this is gotten much better over the past day or 2.  He reports that that has allowed his situation to be much more manageable.  01/14/2019:  The patient had reported indications that he either wanted to die or was frustrated and did not care if he died.  Lengthy discussion with patient today, he reports that he did not have any intention of trying to harm himself.  He reports that his pain, which has been improving, loss of motor function, and being asked over and over how  he was doing lead him to make the comments he did this past Friday.  He denied SI but was feeling frustrated by pain etc.  Patient was instructed how to communicate any feelings of SI if they were to develop.     Disposition/Plan:  Patient continues to report that his depression has not worsened and that he actually feels better with the Abilify added.  Patient will be seen again later this week.  Diagnosis:    Left-sided intracerebral hemorrhage (HCC) - Plan: Ambulatory referral to Neurology         Electronically Signed   _______________________ Arley Phenix, Psy.D.

## 2019-01-14 NOTE — Progress Notes (Signed)
Occupational Therapy Session Note  Patient Details  Name: Patrick Pollard MRN: [id number] Date of Birth: 11/24/1945  Today's Date: 01/14/2019 OT Individual Time: 6144-3154 OT Individual Time Calculation (min): 73 min    Short Term Goals: Week 2:  OT Short Term Goal 1 (Week 2): Pt will maintain static sitting balance at EOB/EOM for 5 min wiht MOD A OT Short Term Goal 2 (Week 2): Pt will recall hemi dressing techniques wiht min VC OT Short Term Goal 3 (Week 2): Pt will sit to stand in stedy with MAX A of 1 to decrease caregiver burden OT Short Term Goal 4 (Week 2): Pt will manage LUE during transfers wiht no more than 1 VC for L attention. OT Short Term Goal 5 (Week 2): Pt will locate all items on L of sink iwht no more than min instructional cueing during grooming tasks.  Skilled Therapeutic Interventions/Progress Updates:    Pt completed bathing and dressing sit to stand at the sink this session.  Max assist for supine to sit with total assist +2 (pt 30%) for squat pivot transfer.  Once in the chair, he was positioned at the sink for bathing.  Max instructional cueing for maintaining head and trunk at midline.  He needed max assist for dynamic sitting balance on the edge of the chair while washing.  Max hand over hand for using the LUE to wash the right arm.  He was then able to wash all of his other UB areas.  He does exhibit more extensive left hand movement this session however compared to last week.  Now with 90% gross digit flexion and 60% of extension.  Still with Brunnstrum stage III in the elbow and hand however.  He was able to complete LB bathing and dressing sit to stand with total assist +2 (pt 30%).  Therapist provided shoe buttons for adaptation to tying shoes.  He was able to donn the left over the foot with mod assist for crossing and maintaining the left leg over the right knee.  He was then able to donn the right with max instructional cueing for initiation and completion.   Finished session with call button and phone in reach and safety alarm belt in place.    Therapy Documentation Precautions:  Precautions Precautions: Fall Precaution Comments: L hemi, SbP <160 Restrictions Weight Bearing Restrictions: No   Pain: Pain Assessment Pain Scale: Faces Pain Score: 0-No pain ADL:  See Care Tool Section for some details of ADL   Therapy/Group: Individual Therapy  Natahlia Hoggard OTR/L 01/14/2019, 11:40 AM

## 2019-01-14 NOTE — Progress Notes (Signed)
Speech Language Pathology Daily Session Note  Patient Details  Name: Patrick Pollard MRN: [id number] Date of Birth: Feb 21, 1946  Today's Date: 01/14/2019 SLP Individual Time:  SLP Individual Time Calculation (min): 57 min  Short Term Goals: Week 2: SLP Short Term Goal 1 (Week 2): Pt will complete semi-complex money and medication management tasks with supervision cues.  SLP Short Term Goal 2 (Week 2): Pt will complete semi-complex reasoning tasks with Min A cues.  SLP Short Term Goal 3 (Week 2): Pt will demonstrate selective attention in moderately distracting environment for ~ 30 minutes with Min A cues.  SLP Short Term Goal 4 (Week 2): Pt will improve recall of complex information after lapsed time in 8 out fo 10 opportunities with Min A cues.   Skilled Therapeutic Interventions: Skilled ST services focused on cognitive skills. Pt was asleep upon entering room with blanket over head, blinds open. Pt agreeable to participate in therapy and to get up in [facility] to complete session in dayroom, however pt fell asleep during preparation of transfer. SLP completed therapy session in room with pt sitting in bed. SLP facilitated recall of medication management, pt demonstrated recall of 3 out 7 medications names/function, following instruction in recall strategy, chunking and reciting, pt demonstrated recall of 7 out 7 medication name/function with min A semantic cues. SLP facilitated semi-complex problem solving utilizing medication management task, pt required mod A verbal cues, for error awareness, recall and attention. Pt stated that his "sleepiness" was impacting his performance. Pt was left in room with call bell within reach and bed alarm set. ST recommends to continue skilled ST services.      Pain Pain Assessment Pain Scale: 0-10 Pain Score: 0-No pain Pain Type: Acute pain Pain Location: Head Pain Descriptors / Indicators: Headache Pain Frequency: Intermittent Pain Onset: Gradual Patients  Stated Pain Goal: 0 Pain Intervention(s): Medication (See eMAR)  Therapy/Group: Individual Therapy  Patrick Pollard  [facility] 01/14/2019, 3:29 PM

## 2019-01-14 NOTE — Progress Notes (Signed)
[facility] PHYSICAL MEDICINE & REHABILITATION PROGRESS NOTE   Subjective/Complaints: Pt met with Neuropsych this am.  Pt not oriented to time Appreciate psych note   ROS: Denies CP, shortness of breath, nausea, vomiting, diarrhea.  Objective:   No results found. No results for input(s): WBC, HGB, HCT, PLT in the last 72 hours. Recent Labs    01/12/19 0529  NA 126*  K 4.4  CL 93*  CO2 22  GLUCOSE 149*  BUN 31*  CREATININE 0.88  CALCIUM 9.4    Intake/Output Summary (Last 24 hours) at 01/14/2019 0938 Last data filed at 01/14/2019 0600 Gross per 24 hour  Intake 1380 ml  Output 2650 ml  Net -1270 ml     Physical Exam: Vital Signs Blood pressure 107/62, pulse 81, temperature 97.9 F (36.6 C), resp. rate 16, height 6' 1"  (1.854 m), weight 95.8 kg, SpO2 94 %. Constitutional: NAD.  Vital signs reviewed.  Well-developed. HENT: Normocephalic.  Atraumatic. Eyes: EOMI. No discharge. Cardiovascular: No JVD. Respiratory: Normal effort. GI: BS +. Non-distended. Musc: No edema or tenderness in extremities. Neuro: Alert Dysarthria Motor:  Left upper extremity: Approximately 0/5, handgrip 2+/5, stable Left lower extremity: 1/5 hip flexion, 0/5 distally unchanged Skin: Warm and dry.  Intact.  Assessment/Plan: 1. 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  Physiatrist is providing close team supervision and 24 hour management of active medical problems listed below.  Physiatrist and rehab team continue to assess barriers to discharge/monitor patient progress toward functional and medical goals  Care Tool:  Bathing    Body parts bathed by patient: Left arm, Chest, Abdomen, Right upper leg, Left upper leg, Right lower leg, Face   Body parts bathed by helper: Buttocks, Front perineal area, Right arm, Left lower leg Body parts n/a: Left lower leg   Bathing assist  Assist Level: 2 Helpers     Upper Body Dressing/Undressing Upper body dressing   What is the patient wearing?: Pull over shirt    Upper body assist Assist Level: Maximal Assistance - Patient 25 - 49%    Lower Body Dressing/Undressing Lower body dressing      What is the patient wearing?: Incontinence brief, Pants     Lower body assist Assist for lower body dressing: 2 Helpers     Toileting Toileting    Toileting assist Assist for toileting: 2 Helpers     Transfers Chair/bed transfer  Transfers assist  Chair/bed transfer activity did not occur: Safety/medical concerns  Chair/bed transfer assist level: Maximal Assistance - Patient 25 - 49%     Locomotion Ambulation   Ambulation assist   Ambulation activity did not occur: Safety/medical concerns  Assist level: 2 helpers Assistive device: Other (comment)(rail) Max distance: 10 ft   Walk 10 feet activity   Assist  Walk 10 feet activity did not occur: Safety/medical concerns  Assist level: 2 helpers Assistive device: Other (comment)(R rail)   Walk 50 feet activity   Assist Walk 50 feet with 2 turns activity did not occur: Safety/medical concerns         Walk 150 feet activity   Assist Walk 150 feet activity did not occur: Safety/medical concerns         Walk 10 feet on uneven surface  activity   Assist Walk 10 feet on uneven surfaces activity did not occur: Safety/medical concerns         Wheelchair  Assist     Wheelchair activity did not occur: Safety/medical concerns         Wheelchair 50 feet with 2 turns activity    Assist    Wheelchair 50 feet with 2 turns activity did not occur: Safety/medical concerns       Wheelchair 150 feet activity     Assist Wheelchair 150 feet activity did not occur: Safety/medical concerns        Medical Problem List and Plan: 1.Left side weakness with facial droopas well as left hemi-sensory losssecondary to right  thalamic hemorrhage with IVH secondary to hypertensive crisis  Continue CIR Left WHO and PRAFO ordered, have discussed donning with patient and nursing 2. Antithrombotics: -DVT/anticoagulation:SCDs -antiplatelet therapy: N/A 3. Pain Management:Tylenol as needed 4. Mood:Abilify 5 mg daily, Effexor 300 mg daily. Provide emotional support appreciate psychiatry note -antipsychotic agents: Abilify Major depressive disorder 5. Neuropsych: This patientiscapable of making decisions on hisown behalf. 6. Skin/Wound Care:Routine skin checks 7. Fluids/Electrolytes/Nutrition:Routine ins and outs   Poor p.o. intake- Remeron started on 4/3   Improving 8.  Essential hypertension  Cont Lisinopril 20 mg daily,   Maxide 37.5-25 mg daily DC'd on 4/4 due to hyponatremia  Monitor with increased mobility Vitals:   01/13/19 2031 01/14/19 0434  BP: 133/77 107/62  Pulse: 92 81  Resp: 20 16  Temp: (!) 97.5 F (36.4 C) 97.9 F (36.6 C)  SpO2: 94% 94%   Amlodipine increased to 24m on 3/29  Controlled on 4/6 9. BPH. Flomax 0.4 mg daily, removed  Foley  10. History of GI bleed . Continue Protonix 11. Hyperlipidemia. Resume Lipitor on discharge. 12.  Constipation-bowel meds increased on 3/30, again on 3/31, again on 4/1     13.  Prediabetes  Diet changed to carb modified   SSI ordered on 4/2   CBG (last 3)  Recent Labs    01/13/19 1704 01/13/19 2110 01/14/19 0640  GLUCAP 131* 192* 120*   14.  Hyponatremia  Sodium 126 on 4/4  Labs ordered for 4/8  Continue to monitor 15.  Sleep disturbance  ECG on 3/24 reviewed, trazodone 50 started on 3/30, DC'd on 4/3  Remeron started on 4/3  Sleep chart ordered  Improving 16.  AKI  Creatinine 0.88 on 4/4  IVF nightly started on 4/3-4/5-echo reviewed, EF stable  Encourage fluids  Labs ordered for 4/8 17.  Leukocytosis- WBCs 12.5 on 4/3  Labs ordered 4/8  Afebrile  Continue to monitor  LOS: 10  days A FACE TO FACE EVALUATION WAS PERFORMED  ACharlett Blake4/03/2019, 9:38 AM

## 2019-01-15 ENCOUNTER — Inpatient Hospital Stay (HOSPITAL_COMMUNITY): Payer: Self-pay | Admitting: Speech Pathology

## 2019-01-15 ENCOUNTER — Inpatient Hospital Stay (HOSPITAL_COMMUNITY): Payer: Self-pay | Admitting: Occupational Therapy

## 2019-01-15 ENCOUNTER — Inpatient Hospital Stay (HOSPITAL_COMMUNITY): Payer: Self-pay | Admitting: Physical Therapy

## 2019-01-15 ENCOUNTER — Inpatient Hospital Stay (HOSPITAL_COMMUNITY): Payer: Self-pay

## 2019-01-15 LAB — GLUCOSE, CAPILLARY
Glucose-Capillary: 150 mg/dL — ABNORMAL HIGH (ref 70–99)
Glucose-Capillary: 150 mg/dL — ABNORMAL HIGH (ref 70–99)
Glucose-Capillary: 130 mg/dL — ABNORMAL HIGH (ref 70–99)

## 2019-01-15 NOTE — Progress Notes (Signed)
[place] PHYSICAL MEDICINE & REHABILITATION PROGRESS NOTE   Subjective/Complaints: Patient states he cannot feel anything on the left side still. No new complaints overnight.  ROS: Denies CP, shortness of breath, nausea, vomiting, diarrhea.  Objective:   No results found. Recent Labs    01/14/19 1007  WBC 13.8*  HGB 16.2  HCT 47.7  PLT 305   Recent Labs    01/14/19 1007  NA 129*  K 4.2  CL 97*  CO2 23  GLUCOSE 213*  BUN 22  CREATININE 0.86  CALCIUM 9.3    Intake/Output Summary (Last 24 hours) at 01/15/2019 1002 Last data filed at 01/15/2019 0538 Gross per 24 hour  Intake 300 ml  Output 1850 ml  Net -1550 ml     Physical Exam: Vital Signs Blood pressure (!) 146/95, pulse 66, temperature 98.3 F (36.8 C), temperature source Oral, resp. rate 18, height 6\' 1"  (1.854 m), weight 95.8 kg, SpO2 95 %. Constitutional: NAD.  Vital signs reviewed.  Well-developed. HENT: Normocephalic.  Atraumatic. Eyes: EOMI. No discharge. Cardiovascular: No JVD. Respiratory: Normal effort. GI: BS +. Non-distended. Musc: No edema or tenderness in extremities. Neuro: Alert Dysarthria Motor:  Left upper extremity: Approximately 0/5, handgrip 2+/5, stable Left lower extremity: 1/5 hip flexion, 0/5 distally unchanged Skin: Warm and dry.  Intact.  Assessment/Plan: 1. 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  Physiatrist is providing close team supervision and 24 hour management of active medical problems listed below.  Physiatrist and rehab team continue to assess barriers to discharge/monitor patient progress toward functional and medical goals  Care Tool:  Bathing    Body parts bathed by patient: Left arm, Chest, Abdomen, Front perineal area, Right upper leg, Left upper leg, Right lower leg   Body parts bathed by helper: Left lower leg, Face, Right arm Body parts  n/a: Left lower leg   Bathing assist Assist Level: 2 Helpers     Upper Body Dressing/Undressing Upper body dressing   What is the patient wearing?: Pull over shirt    Upper body assist Assist Level: Moderate Assistance - Patient 50 - 74%    Lower Body Dressing/Undressing Lower body dressing      What is the patient wearing?: Incontinence brief, Pants     Lower body assist Assist for lower body dressing: 2 Helpers     Toileting Toileting    Toileting assist Assist for toileting: 2 Helpers     Transfers Chair/bed transfer  Transfers assist  Chair/bed transfer activity did not occur: Safety/medical concerns  Chair/bed transfer assist level: Maximal Assistance - Patient 25 - 49%     Locomotion Ambulation   Ambulation assist   Ambulation activity did not occur: Safety/medical concerns  Assist level: 2 helpers Assistive device: Other (comment)(rail) Max distance: 10 ft   Walk 10 feet activity   Assist  Walk 10 feet activity did not occur: Safety/medical concerns  Assist level: 2 helpers Assistive device: Other (comment)(R rail)   Walk 50 feet activity   Assist Walk 50 feet with 2 turns activity did not occur: Safety/medical concerns         Walk 150 feet activity   Assist Walk 150 feet activity did not occur: Safety/medical concerns         Walk 10 feet on uneven surface  activity   Assist Walk 10 feet on uneven surfaces activity did not occur: Safety/medical concerns  Engineer, drilling activity did not occur: Safety/medical concerns         Wheelchair 50 feet with 2 turns activity    Assist    Wheelchair 50 feet with 2 turns activity did not occur: Safety/medical concerns       Wheelchair 150 feet activity     Assist Wheelchair 150 feet activity did not occur: Safety/medical concerns        Medical Problem List and Plan: 1.Left side weakness with facial droopas well as  left hemi-sensory losssecondary to right thalamic hemorrhage with IVH secondary to hypertensive crisis  Continue CIR Left WHO and PRAFO ordered, have discussed donning with patient and nursing 2. Antithrombotics: -DVT/anticoagulation:SCDs -antiplatelet therapy: N/A 3. Pain Management:Tylenol as needed 4. Mood:Abilify 5 mg daily, Effexor 300 mg daily. Provide emotional support appreciate psychiatry note -antipsychotic agents: Abilify Major depressive disorder 5. Neuropsych: This patientiscapable of making decisions on hisown behalf. 6. Skin/Wound Care:Routine skin checks 7. Fluids/Electrolytes/Nutrition:Routine ins and outs   Poor p.o. intake- Remeron started on 4/3   Improving 8.  Essential hypertension  Cont Lisinopril 20 mg daily,   Maxide 37.5-25 mg daily DC'd on 4/4 due to hyponatremia  Monitor with increased mobility Vitals:   01/14/19 1909 01/15/19 0323  BP: (!) 142/85 (!) 146/95  Pulse: 87 66  Resp: 16 18  Temp: 98 F (36.7 C) 98.3 F (36.8 C)  SpO2: 98% 95%   Amlodipine increased to 5mg  on 3/29  Controlled on 4/6 9. BPH. Flomax 0.4 mg daily, removed  Foley  10. History of GI bleed . Continue Protonix 11. Hyperlipidemia. Resume Lipitor on discharge. 12.  Constipation-bowel meds increased on 3/30, again on 3/31, again on 4/1     13.  Prediabetes  Diet changed to carb modified   SSI ordered on 4/2   CBG (last 3)  Recent Labs    01/14/19 1725 01/14/19 2122 01/15/19 0640  GLUCAP 122* 146* 150*  Controlled, 01/15/2019 14.  Hyponatremia  Sodium 126 on 4/4, 129 on 4/6, asymptomatic  Labs ordered for 4/8  Continue to monitor 15.  Sleep disturbance  ECG on 3/24 reviewed, trazodone 50 started on 3/30, DC'd on 4/3  Remeron started on 4/3  Sleep chart ordered  Improving 16.  AKI  Creatinine 0.88 on 4/4  IVF nightly started on 4/3-4/5-echo reviewed, EF stable, BUN normalized 4/6 will DC IV fluids  Encourage  fluids  Labs ordered for 4/8 17.  Leukocytosis- WBCs 12.5 on 4/3, 13.8 on 4/6, remains afebrile  Labs ordered 4/8   Continue to monitor  LOS: 11 days A FACE TO FACE EVALUATION WAS PERFORMED  Erick Colace 01/15/2019, 10:02 AM

## 2019-01-15 NOTE — Progress Notes (Signed)
Speech Language Pathology Daily Session Note  Patient Details  Name: Patrick Pollard MRN: [id number] Date of Birth: 1945-12-25  Today's Date: 01/15/2019 SLP Individual Time: 6301-6010 SLP Individual Time Calculation (min): 20 min  Short Term Goals: Week 2: SLP Short Term Goal 1 (Week 2): Pt will complete semi-complex money and medication management tasks with supervision cues.  SLP Short Term Goal 2 (Week 2): Pt will complete semi-complex reasoning tasks with Min A cues.  SLP Short Term Goal 3 (Week 2): Pt will demonstrate selective attention in moderately distracting environment for ~ 30 minutes with Min A cues.  SLP Short Term Goal 4 (Week 2): Pt will improve recall of complex information after lapsed time in 8 out fo 10 opportunities with Min A cues.   Skilled Therapeutic Interventions:  Pt was seen for skilled ST targeting cognitive goals.  Upon arrival, pt was sleeping in bed, briefly awakened to voice but would trail off and start falling asleep mid-sentence when communicating with therapist.  Blinds were opened, pt repositioned in bed, and lights to room turned on to maximize alertness for participation in therapy.  Pt was able to recall function of medications when named for ~80% accuracy independently, which improved to slightly with min cues for recall.  Despite interventions mentioned above, lethargy continued to be limiting during today's therapy session; as a result, session was ended early.  Pt was left in bed with bed alarm set and call bell within reach.  Continue per current plan of care.    Pain Pain Assessment Pain Scale: 0-10 Pain Score: 0-No pain  Therapy/Group: Individual Therapy  Katlynn Naser, Melanee Spry 01/15/2019, 4:06 PM

## 2019-01-15 NOTE — Progress Notes (Signed)
Physical Therapy Session Note  Patient Details  Name: Patrick Pollard MRN: [id number] Date of Birth: October 25, 1945  Today's Date: 01/15/2019 PT Individual Time: 9892-1194 PT Individual Time Calculation (min): 38 min   Short Term Goals: Week 2:  PT Short Term Goal 1 (Week 2): Pt will perform supine<>sit with no more than mod assist of 1 PT Short Term Goal 2 (Week 2): Pt will perform bed<>chair transfers using LRAD with no more than mod assist of 1 PT Short Term Goal 3 (Week 2): Patient will perform sit<>stand with +2 maximal assistance PT Short Term Goal 4 (Week 2): Pt will maintain seated midline orientation for at least 1 minute with supervision  Skilled Therapeutic Interventions/Progress Updates:    Pt received asleep, supine in bed and agreeable to therapy session, appearing in good spirits this morning. Pt performed supine to sit via logroll technique with heavy max assist of 1 and cuing for sequencing with increased use of R UE to assist with trunk support. Pt requires close supervision with intermittent min to mod assist for static sitting balance on EOB due to LOB. Pt performed sit<>stand using stedy from elevated bed surface with max assist for lifting and for trunk control due to pt demonstrating heavy L lateral trunk lean. Performed static standing in stedy at sink with mirror for visual feedback of midline with pt unable to perform L LE knee extension despite max assist. Performed sit<>stand x2 using stedy at EOB with max assist for lifting - therapist readjusted L LE alignment and foot positioning for improved positioning in stance, then pt able to perform static standing in stedy with max assist for L LE knee extension, possible trace quadriceps muscular activation, and assist for trunk control with visual feedback for midline orientation due to L lateral trunk lean - with improved positioning pt able to tolerate up to ~79minute of standing x 2. Performed seated EOB R lateral reaching with  focus on R trunk elongation, R weight shifting, and sitting balance of recovery without using R UE support after reaching - close supervision throughout with pt requiring use of R UE support x 3 to prevent LOB. Pt transported EOB to w/c in stedy and left seated, reclined in TIS w/c with needs in reach and chair alarm on.   Therapy Documentation Precautions:  Precautions Precautions: Fall Precaution Comments: L hemi, SbP <160 Restrictions Weight Bearing Restrictions: No  Pain:  Reports L hip pain of 3/10 that increases to 7/10 with repetitive movement/activity. Allowed adequate rest breaks during session and repositioning for improved alignment to assist with pain management.  Therapy/Group: Individual Therapy  Ginny Forth, PT, DPT 01/15/2019, 7:47 AM

## 2019-01-15 NOTE — Progress Notes (Signed)
Physical Therapy Session Note  Patient Details  Name: Patrick Pollard MRN: [id number] Date of Birth: Feb 13, 1946  Today's Date: 01/15/2019 PT Individual Time: 1330-1440 PT Individual Time Calculation (min): 70 min   Short Term Goals: Week 2:  PT Short Term Goal 1 (Week 2): Pt will perform supine<>sit with no more than mod assist of 1 PT Short Term Goal 2 (Week 2): Pt will perform bed<>chair transfers using LRAD with no more than mod assist of 1 PT Short Term Goal 3 (Week 2): Patient will perform sit<>stand with +2 maximal assistance PT Short Term Goal 4 (Week 2): Pt will maintain seated midline orientation for at least 1 minute with supervision  Skilled Therapeutic Interventions/Progress Updates:    Pt supine in bed upon PT arrival, agreeable to therapy tx and reports L shoulder pain this session 2.5/10. Pt transferred to sitting EOB with mod assist this session, verbal cues for techniques. Pt performed max assist transfer to w/c with slideboard and cues for techniques/hand placement. Pt reports "feeling off today," pt asking if we can take more breaks throughout/work on less standing. Pt transported to the gym total assist. Pt performed slideboard transfer to the mat with mod assist and slideboard to R. Pt worked on seated balance with min-mod assist this session, increased fatigue requiring increased cues for midline and upright posture, mirror for visual feedback. In sitting pt performed reaching task for dynamic balance and postural control. Pt performed x 1 sit<>stand within stedy this session mod assist from elevated mat, worked on static standing balance with visual feedback using mirror. Pt transferred to w/c with stedy. Pt performed x 1 sit<>stand at the R rail with max assist, cues for techniques. Pt performed x 2 steps with each foot but unable to continue gait training at rail secondary to fatigue. Pt transported back to room and performed slideboard transfer to bed to L with max assist,  cues for techniques. Pt transferred to supine with max assist +2, heat applied to back for pain relief. Therapist also assisted to call family on the phone.    Therapy Documentation Precautions:  Precautions Precautions: Fall Precaution Comments: L hemi, SbP <160 Restrictions Weight Bearing Restrictions: No    Therapy/Group: Individual Therapy  Cresenciano Genre, PT, DPT 01/15/2019, 7:47 AM

## 2019-01-15 NOTE — Progress Notes (Signed)
Occupational Therapy Session Note  Patient Details  Name: Patrick Pollard MRN: [id number] Date of Birth: 1945-12-16  Today's Date: 01/15/2019 OT Individual Time: 1093-2355 OT Individual Time Calculation (min): 78 min    Short Term Goals: Week 2:  OT Short Term Goal 1 (Week 2): Pt will maintain static sitting balance at EOB/EOM for 5 min wiht MOD A OT Short Term Goal 2 (Week 2): Pt will recall hemi dressing techniques wiht min VC OT Short Term Goal 3 (Week 2): Pt will sit to stand in stedy with MAX A of 1 to decrease caregiver burden OT Short Term Goal 4 (Week 2): Pt will manage LUE during transfers wiht no more than 1 VC for L attention. OT Short Term Goal 5 (Week 2): Pt will locate all items on L of sink iwht no more than min instructional cueing during grooming tasks.  Skilled Therapeutic Interventions/Progress Updates:    Pt completed bathing, dressing, and toileting during session.  He needed use of the [facility] for all transfers and +2 total assist (pt 35%) to the toilet and the shower bench.  Total assist +2 (pt 35%) as well for toilet hygiene in standing.  He transferred to the shower bench once toileting was finished.  Increased lean to the left in sitting during bathing with pt being more aware at times and able to correct with mod instructional cueing.  He needed mod instructional cueing as well to sequence through washing as he demonstrated decreased organization.  Max hand over hand assist needed for use of the LUE to wash the right arm or to hold the soap for removal of the top and for pouring onto the washcloth.  He was able to complete UB bathing with overall mod assist in supported sitting.  LB bathing with mod assist as well but did not attempt standing secondary to safety.  Total assist +2 (pt 35%) for transfer out to the tilt in space wheelchair for dressing.  He needed max assist for threading all UB and LB garments with total +2 for standing to pull them up over his hips.   Increased pushing to the left in standing with flexed posture in his trunk and left knee.  Finished session with pt transferring to the bed with max assist squat pivot to the left.  Pt needed max assist for transition to supine and for positioning of the LUE.  Call button and phone in reach with bed alarm in place.    Therapy Documentation Precautions:  Precautions Precautions: Fall Precaution Comments: L hemi, SbP <160 Restrictions Weight Bearing Restrictions: No   Pain: Pain Assessment Pain Scale: Faces Faces Pain Scale: Hurts a little bit Pain Type: Acute pain Pain Location: Generalized Pain Descriptors / Indicators: Discomfort Pain Onset: With Activity Pain Intervention(s): Repositioned ADL: See Care Tool Section for some details of ADL  Therapy/Group: Individual Therapy  Phyllis Whitefield OTR/L 01/15/2019, 11:42 AM

## 2019-01-16 ENCOUNTER — Inpatient Hospital Stay (HOSPITAL_COMMUNITY): Payer: Self-pay | Admitting: Occupational Therapy

## 2019-01-16 ENCOUNTER — Inpatient Hospital Stay (HOSPITAL_COMMUNITY): Payer: Self-pay

## 2019-01-16 LAB — GLUCOSE, CAPILLARY
Glucose-Capillary: 156 mg/dL — ABNORMAL HIGH (ref 70–99)
Glucose-Capillary: 166 mg/dL — ABNORMAL HIGH (ref 70–99)
Glucose-Capillary: 194 mg/dL — ABNORMAL HIGH (ref 70–99)
Glucose-Capillary: 162 mg/dL — ABNORMAL HIGH (ref 70–99)

## 2019-01-16 MED ORDER — METHYLPHENIDATE HCL 5 MG PO TABS
5.0000 mg | ORAL_TABLET | Freq: Every day | ORAL | Status: DC
  Administered 2019-01-16 – 2019-01-17 (×2): 5 mg via ORAL
  Filled 2019-01-16 (×3): qty 1

## 2019-01-16 MED ORDER — BETHANECHOL CHLORIDE 10 MG PO TABS
10.0000 mg | ORAL_TABLET | Freq: Three times a day (TID) | ORAL | Status: DC
  Administered 2019-01-16 – 2019-01-24 (×24): 10 mg via ORAL
  Filled 2019-01-16 (×24): qty 1

## 2019-01-16 NOTE — Progress Notes (Signed)
Speech Language Pathology Daily Session Note  Patient Details  Name: Patrick Pollard MRN: [id number] Date of Birth: 10/04/1946  Today's Date: 01/16/2019 SLP Individual Time: 2707-8675 SLP Individual Time Calculation (min): 59 min  Short Term Goals: Week 2: SLP Short Term Goal 1 (Week 2): Pt will complete semi-complex money and medication management tasks with supervision cues.  SLP Short Term Goal 2 (Week 2): Pt will complete semi-complex reasoning tasks with Min A cues.  SLP Short Term Goal 3 (Week 2): Pt will demonstrate selective attention in moderately distracting environment for ~ 30 minutes with Min A cues.  SLP Short Term Goal 4 (Week 2): Pt will improve recall of complex information after lapsed time in 8 out fo 10 opportunities with Min A cues.   Skilled Therapeutic Interventions: Skilled ST services focused on cognitive skills. Pt was asleep upon entering, agreeable to ST services, however drifting in and out of sleep during conversation. Pt had not consumed breakfast, SLP heated up and set up tray, I hopes of increasing alertness. SLP facilitated complex recall of medication name/dosage and times per day, pt required min A verbal cues to recall 7 out 7 medications. Pt required mod A verbal cues to recall planning strategies piror to beginning semi-complex tasks. SLP facilitated semi-complex problem solving skills continuing medication management task, pt required min A verbal cues for problem solving/error awareness, however required mod A verbal cues for sustained attention in 10 minute intervals. Pt's lethargy continues to impact participation in therapeutic services. Pt was left in room with call bell within reach and bed alarm set. ST recommends to continue skilled ST services.      Pain Pain Assessment Pain Scale: Faces Pain Score: 0-No pain  Therapy/Group: Individual Therapy  Atom Solivan  [facility] 01/16/2019, 12:39 PM

## 2019-01-16 NOTE — Patient Care Conference (Signed)
Inpatient RehabilitationTeam Conference and Plan of Care Update Date: 01/16/2019   Time: 11:20 AM    Patient Name: Patrick Pollard      Medical Record Number: [id number]  Date of Birth: [birth year] Sex: Male         Room/Bed: 4W19C/4W19C-01 Payor Info: Payor: Advertising copywriter MEDICARE / Plan: [facility] MEDICARE / Product Type: *No Product type* /    Admitting Diagnosis: ICH  Admit Date/Time:  01/04/2019  3:36 PM Admission Comments: No comment available   Primary Diagnosis:  ICH (intracerebral hemorrhage) (HCC) Principal Problem: ICH (intracerebral hemorrhage) (HCC)  Patient Active Problem List   Diagnosis Date Noted  . 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 Leukocytosis   . MDD (major depressive disorder), single episode, moderate (HCC)   . AKI (acute kidney injury) (HCC)   . Colonic mass   . Severe episode of recurrent major depressive disorder, without psychotic features (HCC)   . Sleep disturbance   . Hyponatremia   . Prediabetes   . Slow transit constipation   . Essential hypertension   . Labile blood pressure   . Hypertensive emergency 01/04/2019  . Hyperlipemia 01/04/2019  . BPH (benign prostatic hyperplasia) 01/04/2019  . Thyroid mass, left 01/04/2019  . Left-sided intracerebral hemorrhage (HCC) 01/04/2019  . 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 Chronic post-traumatic stress disorder (PTSD) 12/25/2018  . MDD (major depressive disorder), recurrent episode, moderate (HCC) 12/04/2018  . GIB (gastrointestinal bleeding) 12/31/2017    Expected Discharge Date: Expected Discharge Date: 02/01/19  Team Members Present: Physician leading conference: Dr. Claudette Laws Social Worker Present: Staci Acosta, LCSW Nurse Present: Tennis Must, RN PT Present: Woodfin Ganja, PT OT Present: Perrin Maltese, OT SLP Present: Colin Benton, SLP PPS Coordinator present :  Fae Pippin     Current Status/Progress Goal Weekly Team Focus  Medical   Pushes to left side reduced senstion on left, max assist sitting, sleep improved but ? hangover effect from meds  improve bladder emptying, cont with bowels  work on am somnolence    Bowel/Bladder   Patient continent/ incontient of Bladder;  occasional Bowel incontinent; LBM 01/13/19; Coude cath q 8hs if no void  Patient to remain continent of B&B  Assess bowel and bladder q shift and as needed   Swallow/Nutrition/ Hydration             ADL's   Min assist for static sitting balance with max assist fo dynamic.  Still with pushing to the left side as well as head turn to the right.  Total assist +2 for LB selfcare with total assist for sliding board transfers to the right as well.  max assist for transfers to the left.  Increased left digit flexion and extension noted but still only Brunnstrum III in the arm and shoulder with subluxation present.   min to mod assist overall  selfcare retraining, balance retraining, neuromuscular re-education, pt/family education   Mobility   Max assist supine<>sit, max assist sit<>stand using stedy, ambulated ~60ft with max A (+2 w/c follow)  min assist bed mobility, mod assist transfers, mod assist ambulating with LRAD  sitting balance, sit<>stand tranfsers, standing balance, gait, L LE neuromuscular re-education, and midline orientation   Communication             Safety/Cognition/ Behavioral Observations  Mod-Min A, more towards Min A lethargic   Supervision A  semi-complex problem solving, complex recall and selective  attention   Pain   Patient c/o of generalized  pain. head, and mid- lower back pain  Patient pain < or =2  Assess pain q shift and as needed and provide prn as needed   Skin   Skin intact; Bilateral ecchymosis to hands  Refain free of skin breakdown  Assess skin q shift and as needed and provide treatment as ordered    Rehab Goals Patient on target to meet rehab  goals: Yes Rehab Goals Revised: none *See Care Plan and progress notes for long and short-term goals.     Barriers to Discharge  Current Status/Progress Possible Resolutions Date Resolved   Physician    Medical stability     making slow progress  Stimulant trial, review sedating meds      Nursing                  PT                    OT                  SLP                SW                Discharge Planning/Teaching Needs:  Pt to return to his home where his wife and dtr will provide necessary care.  Pt's dtr to come for family education closer to d/c.   Team Discussion:  Pt's sleep is better on Remeron, but Dr. Wynn BankerKirsteins also thinks this could just be pt's stroke impacting his sleep/wake cycle.  He will try a morning dose of Ritalin to see if this helps pt's alertness.  Pt still with high volume PVRs requiring in and out caths.  Dr. Wynn BankerKirsteins to try urecholine.  Pt is continent of bowel.  Pt is Min A UB bathing and dressing and mod A lower body.  Pt pushes to the left and he has poor proprioception.  He needs min A for sitting balance.  He is getting hand movement back.  Goals may need to be downgraded to min/mod A overall.  Pt is doing well with slide board txs at mod A and is max A to +2 for sit to stands.  Pt walked at the rail 10' on Sunday, but not since then.  He's making good progress.  Pt is mod to min A with ST and his thinking is better when he is not lethargic, as this hinders him the most.  ST working on recall of complex tasks and selective attention.   Revisions to Treatment Plan:  none    Continued Need for Acute Rehabilitation Level of Care: The patient requires daily medical management by a physician with specialized training in physical medicine and rehabilitation for the following conditions: Daily direction of a multidisciplinary physical rehabilitation program to ensure safe treatment while eliciting the highest outcome that is of practical value to the  patient.: Yes Daily medical management of patient stability for increased activity during participation in an intensive rehabilitation regime.: Yes Daily analysis of laboratory values and/or radiology reports with any subsequent need for medication adjustment of medical intervention for : Neurological problems;Blood pressure problems;Urological problems   I attest that I was present, lead the team conference, and concur with the assessment and plan of the team.Team conference was held via web/ teleconference due to COVID - 19.   Garvey Westcott, Vista DeckJennifer Capps 01/16/2019, 11:55 AM

## 2019-01-16 NOTE — Progress Notes (Signed)
[place] PHYSICAL MEDICINE & REHABILITATION PROGRESS NOTE   Subjective/Complaints: Feels very tired in am, sleeps well, discussed meds and stroke symptoms  ROS: Denies CP, shortness of breath, nausea, vomiting, diarrhea.  Objective:   No results found. Recent Labs    01/14/19 1007  WBC 13.8*  HGB 16.2  HCT 47.7  PLT 305   Recent Labs    01/14/19 1007  NA 129*  K 4.2  CL 97*  CO2 23  GLUCOSE 213*  BUN 22  CREATININE 0.86  CALCIUM 9.3    Intake/Output Summary (Last 24 hours) at 01/16/2019 1101 Last data filed at 01/16/2019 0401 Gross per 24 hour  Intake -  Output 1600 ml  Net -1600 ml     Physical Exam: Vital Signs Blood pressure (!) 141/83, pulse 99, temperature 98.3 F (36.8 C), temperature source Oral, resp. rate 18, height 6' 1"  (1.854 m), weight 96.2 kg, SpO2 96 %. Constitutional: NAD.  Vital signs reviewed.  Well-developed. HENT: Normocephalic.  Atraumatic. Eyes: EOMI. No discharge. Cardiovascular: No JVD. Respiratory: Normal effort. GI: BS +. Non-distended. Musc: No edema or tenderness in extremities. Neuro: Alert Dysarthria Motor:  Left upper extremity: Approximately 0/5, handgrip 2+/5, stable Left lower extremity: 1/5 hip flexion, 0/5 distally unchanged Skin: Warm and dry.  Intact.  Assessment/Plan: 1. 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  Physiatrist is providing close team supervision and 24 hour management of active medical problems listed below.  Physiatrist and rehab team continue to assess barriers to discharge/monitor patient progress toward functional and medical goals  Care Tool:  Bathing    Body parts bathed by patient: Left arm, Chest, Abdomen, Front perineal area, Face, Left upper leg, Right upper leg, Left lower leg   Body parts bathed by helper: Left lower leg, Face, Right arm Body parts n/a: Left lower  leg   Bathing assist Assist Level: 2 Helpers     Upper Body Dressing/Undressing Upper body dressing   What is the patient wearing?: Button up shirt    Upper body assist Assist Level: Maximal Assistance - Patient 25 - 49%    Lower Body Dressing/Undressing Lower body dressing      What is the patient wearing?: Incontinence brief, Pants     Lower body assist Assist for lower body dressing: 2 Helpers     Toileting Toileting    Toileting assist Assist for toileting: 2 Helpers     Transfers Chair/bed transfer  Transfers assist  Chair/bed transfer activity did not occur: Safety/medical concerns  Chair/bed transfer assist level: Maximal Assistance - Patient 25 - 49%     Locomotion Ambulation   Ambulation assist   Ambulation activity did not occur: Safety/medical concerns  Assist level: 2 helpers Assistive device: Other (comment)(rail) Max distance: 10 ft   Walk 10 feet activity   Assist  Walk 10 feet activity did not occur: Safety/medical concerns  Assist level: 2 helpers Assistive device: Other (comment)(R rail)   Walk 50 feet activity   Assist Walk 50 feet with 2 turns activity did not occur: Safety/medical concerns         Walk 150 feet activity   Assist Walk 150 feet activity did not occur: Safety/medical concerns         Walk 10 feet on uneven surface  activity   Assist Walk 10 feet on uneven surfaces activity did not occur: Safety/medical concerns  Programmer, multimedia activity did not occur: Safety/medical concerns         Wheelchair 50 feet with 2 turns activity    Assist    Wheelchair 50 feet with 2 turns activity did not occur: Safety/medical concerns       Wheelchair 150 feet activity     Assist Wheelchair 150 feet activity did not occur: Safety/medical concerns        Medical Problem List and Plan: 1.Left side weakness with facial droopas well as left hemi-sensory  losssecondary to right thalamic hemorrhage with IVH secondary to hypertensive crisis  Continue CIR, Team conference today please see physician documentation under team conference tab, met with team face-to-face to discuss problems,progress, and goals. Formulized individual treatment plan based on medical history, underlying problem and comorbidities. Left WHO and PRAFO ordered, have discussed donning with patient and nursing 2. Antithrombotics: -DVT/anticoagulation:SCDs -antiplatelet therapy: N/A 3. Pain Management:Tylenol as needed 4. Mood:Abilify 5 mg daily, Effexor 300 mg daily. Provide emotional support appreciate psychiatry note -antipsychotic agents: Abilify Major depressive disorder 5. Neuropsych: This patientiscapable of making decisions on hisown behalf. 6. Skin/Wound Care:Routine skin checks 7. Fluids/Electrolytes/Nutrition:Routine ins and outs   Poor p.o. intake- Remeron started on 4/3   Improving 8.  Essential hypertension  Cont Lisinopril 20 mg daily,   Maxide 37.5-25 mg daily DC'd on 4/4 due to hyponatremia  Monitor with increased mobility Vitals:   01/15/19 1911 01/16/19 0406  BP: (!) 136/99 (!) 141/83  Pulse: (!) 102 99  Resp: 18 18  Temp: 98 F (36.7 C) 98.3 F (36.8 C)  SpO2: 95% 96%   Amlodipine increased to 14m on 3/29  Controlled on 4/8 9. BPH. Flomax 0.4 mg daily, removed  Foley  10. History of GI bleed . Continue Protonix 11. Hyperlipidemia. Resume Lipitor on discharge. 12.  Constipation-bowel meds increased on 3/30, again on 3/31, again on 4/1     13.  Prediabetes  Diet changed to carb modified   SSI ordered on 4/2   CBG (last 3)  Recent Labs    01/15/19 1205 01/15/19 2110 01/16/19 0621  GLUCAP 150* 130* 156*  Controlled, 01/16/2019 14.  Hyponatremia  Sodium 126 on 4/4, 129 on 4/6, asymptomatic  Labs ordered for 4/8  Continue to monitor 15.  Sleep disturbance- improved but has somnolence in am  may be related to remeron, CVA location may contribute add ritalin in am  ECG on 3/24 reviewed, trazodone 50 started on 3/30, DC'd on 4/3  Remeron started on 4/3  Sleep chart ordered  Improving 16.  AKI  Creatinine 0.88 on 4/4  IVF nightly started on 4/3-4/5-echo reviewed, EF stable, BUN normalized 4/6 will DC IV fluids  Encourage fluids  Labs ordered for 4/8 17.  Leukocytosis- WBCs 12.5 on 4/3, 13.8 on 4/6, remains afebrile  Labs ordered 4/8   Continue to monitor  LOS: 12 days A FACE TO FACE EVALUATION WAS PERFORMED  ACharlett Blake4/05/2019, 11:01 AM

## 2019-01-16 NOTE — Progress Notes (Signed)
Occupational Therapy Session Note  Patient Details  Name: Patrick Pollard MRN: [id number] Date of Birth: 1945/12/20  Today's Date: 01/16/2019 OT Individual Time: 1002-1100 OT Individual Time Calculation (min): 58 min    Short Term Goals: Week 2:  OT Short Term Goal 1 (Week 2): Pt will maintain static sitting balance at EOB/EOM for 5 min wiht MOD A OT Short Term Goal 2 (Week 2): Pt will recall hemi dressing techniques wiht min VC OT Short Term Goal 3 (Week 2): Pt will sit to stand in stedy with MAX A of 1 to decrease caregiver burden OT Short Term Goal 4 (Week 2): Pt will manage LUE during transfers wiht no more than 1 VC for L attention. OT Short Term Goal 5 (Week 2): Pt will locate all items on L of sink iwht no more than min instructional cueing during grooming tasks.  Skilled Therapeutic Interventions/Progress Updates:    Pt completed bathing and dressing sit to stand during session.  He was able to transfer from supine to sit EOB with max assist.  Once sitting he was able to maintain static balance with RUE support and min assist.  He then completed sliding board transfer to the wheelchair with overall mod assist to the right.  Still needing max assist for dynamic sitting balance during bathing and dressing from the wheelchair.  He was able to complete UB bathing supported with min assist and max assist for donning paper shirt.  He was able to complete sit to stand with max assist for LB bathing and pulling pants over his hips.  Max hand over hand assist for integration of the LUE into bathing tasks to wash the right arm. Max demonstrational cueing for awareness of sitting balance during session as well and for him to correct pushing/leaning to the left side.  Finished session with pt tilted slightly in the wheelchair and LUE supported on pillows.  Call button and phone in reach and alarm belt in place.    Therapy Documentation Precautions:  Precautions Precautions: Fall Precaution  Comments: L hemi, SbP <160 Restrictions Weight Bearing Restrictions: No  Pain: Pain Assessment Pain Scale: Faces Pain Score: 0-No pain ADL:  See Care Tool Section for some details of ADL  Therapy/Group: Individual Therapy  Gibril Mastro OTR/L 01/16/2019, 11:18 AM

## 2019-01-16 NOTE — Progress Notes (Signed)
Physical Therapy Session Note  Patient Details  Name: Patrick Pollard MRN: [id number] Date of Birth: May 12, 1946  Today's Date: 01/16/2019 PT Individual Time: 1330-1445 PT Individual Time Calculation (min): 75 min   Short Term Goals: Week 2:  PT Short Term Goal 1 (Week 2): Pt will perform supine<>sit with no more than mod assist of 1 PT Short Term Goal 2 (Week 2): Pt will perform bed<>chair transfers using LRAD with no more than mod assist of 1 PT Short Term Goal 3 (Week 2): Patient will perform sit<>stand with +2 maximal assistance PT Short Term Goal 4 (Week 2): Pt will maintain seated midline orientation for at least 1 minute with supervision  Skilled Therapeutic Interventions/Progress Updates:    Pt supine in bed upon PT arrival, agreeable to therapy tx and denies pain. Pt transferred to sitting with max assist, slideboard transfer to w/c with mod assist, cues for techniques. Pt transported to the gym. Pt worked on standing balance, pre-gait and gait at the rail this session using mirror for visual feedback. Pt performed x 5 sit<>stands throughout session using R rail (L UE over therapists shoulder) and max assist. Pt worked on standing balance without UE support while performing reaching task with R UE, x 2 trials, mod assist for balance and cues for maintaining midline. Pt worked on pre-gait stepping in place with R LE while using rail, max assist for L stance control to prevent buckling. Pt ambulated 2 x 10 ft this session using R rail, L AFO with shoe cover for foot clearance, max assist +2 for w/c follow, therapist blocking L knee during stance, advancing L LE during swing and manual facilitation for increased hip extension/weightshifting, verbal cues for increased trunk/hip extension. Pt transferred from w/c<>mat this session using slideboard and mod assist, cues for head/hips relationship and trunk control awareness during transfer. Pt worked on sitting balance while edge of mat, reaching  outside BOS for horseshoes, supervision-min assist for balance with cues at time for awareness of midline orientation. Pt transported back to room and left in TIS with needs in reach, chair alarm set.   Therapy Documentation Precautions:  Precautions Precautions: Fall Precaution Comments: L hemi, SbP <160 Restrictions Weight Bearing Restrictions: No    Therapy/Group: Individual Therapy  Cresenciano Genre, PT, DPT 01/16/2019, 8:00 AM

## 2019-01-17 ENCOUNTER — Inpatient Hospital Stay (HOSPITAL_COMMUNITY): Payer: Self-pay | Admitting: Physical Therapy

## 2019-01-17 ENCOUNTER — Inpatient Hospital Stay (HOSPITAL_COMMUNITY): Payer: Self-pay | Admitting: Occupational Therapy

## 2019-01-17 ENCOUNTER — Inpatient Hospital Stay (HOSPITAL_COMMUNITY): Payer: Self-pay

## 2019-01-17 ENCOUNTER — Inpatient Hospital Stay (HOSPITAL_COMMUNITY): Payer: Self-pay | Admitting: Speech Pathology

## 2019-01-17 LAB — GLUCOSE, CAPILLARY
Glucose-Capillary: 153 mg/dL — ABNORMAL HIGH (ref 70–99)
Glucose-Capillary: 144 mg/dL — ABNORMAL HIGH (ref 70–99)
Glucose-Capillary: 160 mg/dL — ABNORMAL HIGH (ref 70–99)
Glucose-Capillary: 131 mg/dL — ABNORMAL HIGH (ref 70–99)

## 2019-01-17 MED ORDER — METHYLPHENIDATE HCL 5 MG PO TABS
10.0000 mg | ORAL_TABLET | Freq: Every day | ORAL | Status: DC
  Administered 2019-01-18 – 2019-02-07 (×21): 10 mg via ORAL
  Filled 2019-01-17 (×22): qty 2

## 2019-01-17 NOTE — Progress Notes (Signed)
Occupational Therapy Session Note  Patient Details  Name: Senay Capozza MRN: [id number] Date of Birth: 12/18/1945  Today's Date: 01/17/2019 OT Individual Time: 5638-9373 OT Individual Time Calculation (min): 70 min    Short Term Goals: Week 2:  OT Short Term Goal 1 (Week 2): Pt will maintain static sitting balance at EOB/EOM for 5 min wiht MOD A OT Short Term Goal 2 (Week 2): Pt will recall hemi dressing techniques wiht min VC OT Short Term Goal 3 (Week 2): Pt will sit to stand in stedy with MAX A of 1 to decrease caregiver burden OT Short Term Goal 4 (Week 2): Pt will manage LUE during transfers wiht no more than 1 VC for L attention. OT Short Term Goal 5 (Week 2): Pt will locate all items on L of sink iwht no more than min instructional cueing during grooming tasks.  Skilled Therapeutic Interventions/Progress Updates:    Pt completed toilet transfer with use of the Stedy and total assist to begin session.  Total assist for clothing and hygiene sit to stand with max assist for needed to pull up to standing on the [place].  He then transferred over to the shower for bathing with use of the [facility] as well.  UB bathing with min assist in supported sitting.  Max hand over hand for initiation of LUE functional use for holding washcloth and soap bottle as well as for washing the RUE and shoulder.  Min assist for LB bathing in sitting only.  He transferred out to the wheelchair via [facility] for grooming and dressing from the wheelchair.  He donned incontinence brief with max assist sit to stand as well as gown.  Gripper socks were donned using one handed technique with mod assist.  Finished session with squat pivot transfer to the left to the bed from the wheelchair with max assist.  Max assist also needed for transition to supine from sitting.  Call button and phone in reach as well as bed alarm in place.      Therapy Documentation Precautions:  Precautions Precautions: Fall Precaution Comments: L  hemi, SbP <160 Restrictions Weight Bearing Restrictions: No  Pain: Pain Assessment Pain Scale: Faces Pain Score: 0-No pain ADL: See Care Tool Section for some details of ADL  Therapy/Group: Individual Therapy  Sultan Pargas OTR/L 01/17/2019, 11:51 AM

## 2019-01-17 NOTE — Progress Notes (Signed)
Physical Therapy Session Note  Patient Details  Name: Patrick Pollard MRN: [id number] Date of Birth: [birth year]  Today's Date: 01/17/2019 PT Individual Time: 1515-1600 PT Individual Time Calculation (min): 45 min   Short Term Goals: Week 2:  PT Short Term Goal 1 (Week 2): Pt will perform supine<>sit with no more than mod assist of 1 PT Short Term Goal 2 (Week 2): Pt will perform bed<>chair transfers using LRAD with no more than mod assist of 1 PT Short Term Goal 3 (Week 2): Patient will perform sit<>stand with +2 maximal assistance PT Short Term Goal 4 (Week 2): Pt will maintain seated midline orientation for at least 1 minute with supervision  Skilled Therapeutic Interventions/Progress Updates:   Pt received supine in bed with nursing staff present and pt appearing in an uplifted spirit today. Pt eager to participate with therapy. Pt performed supine to sit via logroll technique for increased independence with mod assist for trunk upright and verbal/tacitle cuing for sequencing of task. Pt performed R lateral scoot transfer EOB to w/c using transfer board with mod/max assist of 1 for lifting/pivoting hips and for trunk control - 2nd person present for safety with min assist 1x during transfer for scooting hips. Pt transported to gym in w/c. Therapist donned bilateral shoes and socks with L LE AFO in preparation for standing activity with max assist for time management. Pt performed sit<>stand x3 from w/c to R hallway rail using R UE support with max assist for lifting and balance upon coming to standing. Pt performed x3 bouts of pre-gait training of R LE stepping forward/back with focus on upright and midline trunk orientation with mirror feedback and L LE knee and hip extension - max multimodal cuing throughout for improved posture and L LE extension - heavy max assist provided for balance and to prevent L LE knee buckling in stance with minimal to no L quadriceps muscular activation noted. Pt  returned to room in w/c and left sitting in TIS w/c per pt request with call bell in reach and seat belt alarm on.  Therapy Documentation Precautions:  Precautions Precautions: Fall Precaution Comments: L hemi, SbP <160 Restrictions Weight Bearing Restrictions: No  Pain: Pt denies pain during this session.   Therapy/Group: Individual Therapy  Ginny Forth, PT, DPT 01/17/2019, 5:13 PM

## 2019-01-17 NOTE — Progress Notes (Signed)
Speech Language Pathology Daily Session Note  Patient Details  Name: Sidhant Kult MRN: [id number] Date of Birth: 1946/05/07  Today's Date: 01/17/2019 SLP Individual Time: 0830-0930 SLP Individual Time Calculation (min): 60 min  Short Term Goals: Week 2: SLP Short Term Goal 1 (Week 2): Pt will complete semi-complex money and medication management tasks with supervision cues.  SLP Short Term Goal 2 (Week 2): Pt will complete semi-complex reasoning tasks with Min A cues.  SLP Short Term Goal 3 (Week 2): Pt will demonstrate selective attention in moderately distracting environment for ~ 30 minutes with Min A cues.  SLP Short Term Goal 4 (Week 2): Pt will improve recall of complex information after lapsed time in 8 out fo 10 opportunities with Min A cues.   Skilled Therapeutic Interventions:    Skilled treatment session focused on cognition goals. SLP received pt asleep in bed, able to arouse but unable to maintain alertness. In effort to increase alertness, SLP facilitated transfer to wheelchair with OT using slide board. After in chair, pt able to demonstrate selective attention to task for ~ 45 minutes in moderately distracting environment with Min A cues. Pt also able to follow directions to participate in slide board transfer. Pt was oriented x 4 and able to complete activities targeting listening for details with supervision cues to achieve ~ 90% accuracy with semi-complex passages. Pt left upright in tilt-in-space wheelchair, seat belt in place, lap belt alarm on and ipad in front of him to promote increased alertness. Continue per current plan of care.   Pain Pain Assessment Pain Scale: 0-10 Pain Score: 0-No pain  Therapy/Group: Individual Therapy  Teshia Mahone 01/17/2019, 10:58 AM

## 2019-01-17 NOTE — Progress Notes (Signed)
Occupational Therapy Session Note  Patient Details  Name: Patrick Pollard MRN: [id number] Date of Birth: 02-Sep-1946  Today's Date: 01/17/2019 OT Individual Time: 1345-1415 OT Individual Time Calculation (min): 30 min    Short Term Goals: Week 1:  OT Short Term Goal 1 (Week 1): Pt will maintain static sitting balance at EOB/EOM for 5 min wiht MOD A OT Short Term Goal 1 - Progress (Week 1): Not met OT Short Term Goal 2 (Week 1): Pt will recall hemi dressing techniques wiht min VC OT Short Term Goal 2 - Progress (Week 1): Not met OT Short Term Goal 3 (Week 1): Pt will sit to stand in stedy iwht MAX A of 1 to decrease caregiver burden OT Short Term Goal 3 - Progress (Week 1): Not met OT Short Term Goal 4 (Week 1): Pt will manage LUE during transfers wiht no more than 1 VC for L attention OT Short Term Goal 4 - Progress (Week 1): Not met OT Short Term Goal 5 (Week 1): pt will locate all items on L of sink iwht no VC during grooming tasks OT Short Term Goal 5 - Progress (Week 1): Not met  Skilled Therapeutic Interventions/Progress Updates:    1;1. Pt received in bed, pt with no report of pain. Pt agreeable to work on [employer] in [place] with mirror for visual feedback of RUE movement. Pt demo gross grasp/reslease an wrist fle/ext, Pt occasionally demo min elbow flexion in side lying wit VC for touching hand to face with max facilitation and reaching out to tray table for shoulder flex/ext and pro/retraction facilitation at scapula (MAX A). Educated pt on NMR after stroke as well as neuroplasticity. Exited session with tp seated in bed, exit alarm on and call light in reach  Therapy Documentation Precautions:  Precautions Precautions: Fall Precaution Comments: L hemi, SbP <160 Restrictions Weight Bearing Restrictions: No General:   Vital Signs:  Pain: Pain Assessment Pain Scale: Faces Pain Score: 0-No pain ADL:   Vision   Perception    Praxis   Exercises:   Other Treatments:      Therapy/Group: Individual Therapy  Tonny Branch 01/17/2019, 2:15 PM

## 2019-01-17 NOTE — Progress Notes (Signed)
Social Work Patient ID: Patrick Pollard, male   DOB: Jul 10, 1946, 73 y.o.   MRN: [id number]   CSW met with pt to update him on team conference discussion and to let him know we are still on target for 02-01-19 and then spoke with his wife and dtr via telephone to do the same.  They asked a lot of questions about pt's progress and asked that a dietician see pt prior to his d/c and then call them with the information so they can prepare meals properly for him.  Dtr will come for family education closer to d/c.  CSW will continue to follow and assist as needed.

## 2019-01-17 NOTE — Progress Notes (Signed)
[facility] PHYSICAL MEDICINE & REHABILITATION PROGRESS NOTE   Subjective/Complaints:  Still tired in am but some improvement , moving fingers better  ROS: Denies CP, shortness of breath, nausea, vomiting, diarrhea.  Objective:   No results found. Recent Labs    01/14/19 1007  WBC 13.8*  HGB 16.2  HCT 47.7  PLT 305   Recent Labs    01/14/19 1007  NA 129*  K 4.2  CL 97*  CO2 23  GLUCOSE 213*  BUN 22  CREATININE 0.86  CALCIUM 9.3    Intake/Output Summary (Last 24 hours) at 01/17/2019 0923 Last data filed at 01/17/2019 0500 Gross per 24 hour  Intake -  Output 1600 ml  Net -1600 ml     Physical Exam: Vital Signs Blood pressure 121/73, pulse (!) 101, temperature 98.3 F (36.8 C), temperature source Oral, resp. rate 16, height 6\' 1"  (1.854 m), weight 96.2 kg, SpO2 95 %. Constitutional: NAD.  Vital signs reviewed.  Well-developed. HENT: Normocephalic.  Atraumatic. Eyes: EOMI. No discharge. Cardiovascular: No JVD. Respiratory: Normal effort. GI: BS +. Non-distended. Musc: No edema or tenderness in extremities. Neuro: Alert Dysarthria Motor:  Left upper extremity: Approximately 0/5, handgrip 2+/5, stable, 3- 4th and 5th digit ext on Left side today Left lower extremity: 1/5 hip flexion, 0/5 distally unchanged Skin: Warm and dry.  Intact.  Assessment/Plan: 1. 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  Physiatrist is providing close team supervision and 24 hour management of active medical problems listed below.  Physiatrist and rehab team continue to assess barriers to discharge/monitor patient progress toward functional and medical goals  Care Tool:  Bathing    Body parts bathed by patient: Left arm, Chest, Abdomen, Front perineal area, Face, Left upper leg, Right upper leg, Right lower leg   Body parts bathed by helper: Left lower leg, Buttocks,  Right arm Body parts n/a: Left lower leg   Bathing assist Assist Level: Maximal Assistance - Patient 24 - 49%     Upper Body Dressing/Undressing Upper body dressing   What is the patient wearing?: Pull over shirt    Upper body assist Assist Level: Maximal Assistance - Patient 25 - 49%    Lower Body Dressing/Undressing Lower body dressing      What is the patient wearing?: Incontinence brief, Pants     Lower body assist Assist for lower body dressing: Maximal Assistance - Patient 25 - 49%     Toileting Toileting    Toileting assist Assist for toileting: 2 Helpers     Transfers Chair/bed transfer  Transfers assist  Chair/bed transfer activity did not occur: Safety/medical concerns  Chair/bed transfer assist level: Moderate Assistance - Patient 50 - 74%     Locomotion Ambulation   Ambulation assist   Ambulation activity did not occur: Safety/medical concerns  Assist level: 2 helpers Assistive device: Other (comment)(rail) Max distance: 10 ft   Walk 10 feet activity   Assist  Walk 10 feet activity did not occur: Safety/medical concerns  Assist level: 2 helpers Assistive device: Other (comment)(R rail)   Walk 50 feet activity   Assist Walk 50 feet with 2 turns activity did not occur: Safety/medical concerns         Walk 150 feet activity   Assist Walk 150 feet activity did not occur: Safety/medical concerns         Walk 10 feet on uneven surface  activity  Assist Walk 10 feet on uneven surfaces activity did not occur: Safety/medical Engineer, technical salesconcerns         Wheelchair     Assist     Wheelchair activity did not occur: Safety/medical concerns         Wheelchair 50 feet with 2 turns activity    Assist    Wheelchair 50 feet with 2 turns activity did not occur: Safety/medical concerns       Wheelchair 150 feet activity     Assist Wheelchair 150 feet activity did not occur: Safety/medical concerns        Medical  Problem List and Plan: 1.Left side weakness with facial droopas well as left hemi-sensory losssecondary to right thalamic hemorrhage with IVH secondary to hypertensive crisis  Continue CIR, PT, OT, SLP Left WHO and PRAFO ordered, have discussed donning with patient and nursing 2. Antithrombotics: -DVT/anticoagulation:SCDs -antiplatelet therapy: N/A 3. Pain Management:Tylenol as needed 4. Mood:Abilify 5 mg daily, Effexor 300 mg daily. Provide emotional support appreciate psychiatry note -antipsychotic agents: Abilify Major depressive disorder 5. Neuropsych: This patientiscapable of making decisions on hisown behalf. 6. Skin/Wound Care:Routine skin checks 7. Fluids/Electrolytes/Nutrition:Routine ins and outs   Poor p.o. intake- Remeron started on 4/3   Improving 8.  Essential hypertension  Cont Lisinopril 20 mg daily,   Maxide 37.5-25 mg daily DC'd on 4/4 due to hyponatremia  Monitor with increased mobility Vitals:   01/17/19 0000 01/17/19 0445  BP:  121/73  Pulse:  (!) 101  Resp:  16  Temp: 98 F (36.7 C) 98.3 F (36.8 C)  SpO2:  95%   Amlodipine increased to 5mg  on 3/29  Controlled on 4/9, elevated HR ? ritalin 9. BPH. Flomax 0.4 mg daily, removed  Foley  10. History of GI bleed . Continue Protonix 11. Hyperlipidemia. Resume Lipitor on discharge. 12.  Constipation-bowel meds increased on 3/30, again on 3/31, again on 4/1     13.  Prediabetes  Diet changed to carb modified   SSI ordered on 4/2   CBG (last 3)  Recent Labs    01/16/19 1714 01/16/19 2125 01/17/19 0621  GLUCAP 194* 162* 153*  Controlled, 01/17/2019 14.  Hyponatremia  Sodium 126 on 4/4, 129 on 4/6, asymptomatic  Labs ordered for 4/10  Continue to monitor 15.  Sleep disturbance- improved but has somnolence in am may be related to remeron, CVA location may contribute increase ritalin in am  ECG on 3/24 reviewed, trazodone 50 started on 3/30, DC'd on  4/3  Remeron started on 4/3  Sleep chart ordered  Improving 16.  AKI  Creatinine 0.88 on 4/4  IVF nightly started on 4/3-4/5-echo reviewed, EF stable, BUN normalized 4/6 will DC IV fluids  Encourage fluids  Labs ordered for 4/8 17.  Leukocytosis- WBCs 12.5 on 4/3, 13.8 on 4/6, remains afebrile  Labs ordered 4/8   Continue to monitor  LOS: 13 days A FACE TO FACE EVALUATION WAS PERFORMED  Erick Colacendrew E Verna Desrocher 01/17/2019, 9:23 AM

## 2019-01-18 ENCOUNTER — Inpatient Hospital Stay (HOSPITAL_COMMUNITY): Payer: Self-pay | Admitting: Physical Therapy

## 2019-01-18 ENCOUNTER — Inpatient Hospital Stay (HOSPITAL_COMMUNITY): Payer: Self-pay | Admitting: Occupational Therapy

## 2019-01-18 ENCOUNTER — Inpatient Hospital Stay (HOSPITAL_COMMUNITY): Payer: Self-pay

## 2019-01-18 ENCOUNTER — Inpatient Hospital Stay (HOSPITAL_COMMUNITY): Payer: Self-pay | Admitting: Speech Pathology

## 2019-01-18 LAB — CBC WITH DIFFERENTIAL/PLATELET
Abs Immature Granulocytes: 0.07 10*3/uL (ref 0.00–0.07)
Basophils Absolute: 0 10*3/uL (ref 0.0–0.1)
Basophils Relative: 0 %
Eosinophils Absolute: 0.2 10*3/uL (ref 0.0–0.5)
Eosinophils Relative: 2 %
HCT: 43.3 % (ref 39.0–52.0)
Hemoglobin: 14.5 g/dL (ref 13.0–17.0)
Immature Granulocytes: 1 %
Lymphocytes Relative: 12 %
Lymphs Abs: 1.3 10*3/uL (ref 0.7–4.0)
MCH: 29.3 pg (ref 26.0–34.0)
MCHC: 33.5 g/dL (ref 30.0–36.0)
MCV: 87.5 fL (ref 80.0–100.0)
Monocytes Absolute: 0.8 10*3/uL (ref 0.1–1.0)
Monocytes Relative: 7 %
Neutro Abs: 8.1 10*3/uL — ABNORMAL HIGH (ref 1.7–7.7)
Neutrophils Relative %: 78 %
Platelets: 304 10*3/uL (ref 150–400)
RBC: 4.95 MIL/uL (ref 4.22–5.81)
RDW: 13.1 % (ref 11.5–15.5)
WBC: 10.5 10*3/uL (ref 4.0–10.5)
nRBC: 0 % (ref 0.0–0.2)

## 2019-01-18 LAB — GLUCOSE, CAPILLARY
Glucose-Capillary: 145 mg/dL — ABNORMAL HIGH (ref 70–99)
Glucose-Capillary: 138 mg/dL — ABNORMAL HIGH (ref 70–99)
Glucose-Capillary: 208 mg/dL — ABNORMAL HIGH (ref 70–99)
Glucose-Capillary: 124 mg/dL — ABNORMAL HIGH (ref 70–99)
Glucose-Capillary: 185 mg/dL — ABNORMAL HIGH (ref 70–99)

## 2019-01-18 LAB — BASIC METABOLIC PANEL
Anion gap: 12 (ref 5–15)
BUN: 14 mg/dL (ref 8–23)
CO2: 24 mmol/L (ref 22–32)
Calcium: 8.6 mg/dL — ABNORMAL LOW (ref 8.9–10.3)
Chloride: 95 mmol/L — ABNORMAL LOW (ref 98–111)
Creatinine, Ser: 0.67 mg/dL (ref 0.61–1.24)
GFR calc Af Amer: >60 mL/min (ref >60)
GFR calc non Af Amer: >60 mL/min (ref >60)
Glucose, Bld: 139 mg/dL — ABNORMAL HIGH (ref 70–99)
Potassium: 3.9 mmol/L (ref 3.5–5.1)
Sodium: 131 mmol/L — ABNORMAL LOW (ref 135–145)

## 2019-01-18 NOTE — Progress Notes (Signed)
Occupational Therapy Weekly Progress Note  Patient Details  Name: Patrick Pollard MRN: [id number] Date of Birth: [birth year]  Beginning of progress report period: January 12, 2019 End of progress report period: January 18, 2019  Today's Date: 01/18/2019 OT Individual Time: 9357-0177 OT Individual Time Calculation (min): 77 min    Patient has met 4 of 5 short term goals.  Patrick Pollard is making steady progress with OT this week.  He has progressed to being able to complete sit to stand with max assist and maintain static standing balance at the sink during selfcare tasks at the same level.  He still needs total assist +2 (pt 40%) for LB selfcare, but this is still a significant improvement since last week.  He is able to perform UB bathing at min assist level with LB bathing at total +2 (pt 40%) consistently.  UB dressing is still at a mod to max assist with LB dressing at +2 assist adhering to hemi dressing techniques.  Patrick Pollard continues to need use of the [facility] for toilet transfers and walk-in shower transfers secondary to pusher tendencies to the left, however the pushing has decreased some where he is able to maintain static sitting balance with RUE support and min assist.  Dynamic sitting balance during selfcare tasks is still at an overall mod to max assist level with LOB to the left and posteriorly.  LUE hand function has improved to a Brunnstrum stag V level with gross digit flexion and extension present at 70-80% however active elbow and shoulder movement is still at Brunnstrum stage II-III.  He maintains cervical flexion with rotation to the right at rest, but he can still spontaneously turn his head to the left for locating therapist during conversation or when looking for grooming items.  Sustained attention has improved to a few mins at a time, however selective attention is still impaired as pt can become easily distracted by conversation and stop completion of selfcare tasks, thus requiring  max instructional cueing to continue back on task.  Overall, pt is making slow but steady gains.  Will continue with current OT POC with expected discharge home with 24 hour assist 4/27.    Patient continues to demonstrate the following deficits: muscle weakness, impaired timing and sequencing, unbalanced muscle activation, decreased coordination and decreased motor planning, decreased midline orientation, decreased attention to left and left side neglect, decreased attention, decreased awareness and decreased memory and decreased sitting balance, decreased standing balance, decreased postural control, hemiplegia and decreased balance strategies and therefore will continue to benefit from skilled OT intervention to enhance overall performance with BADL and Reduce care partner burden.  Patient progressing toward long term goals..  Continue plan of care.  OT Short Term Goals Week 3:  OT Short Term Goal 1 (Week 3): Pt will donn pants sit to stand with max assist during session. OT Short Term Goal 2 (Week 3): Pt will donn pullover shirt with min assist in supported sitting.  OT Short Term Goal 3 (Week 3): Pt will complete toilet transfer squat pivot with mod assist to both the left and right.  OT Short Term Goal 4 (Week 3): Pt will use the LUE as a gross assist with selfcare tasks with mod facilitation during bathing tasks. OT Short Term Goal 5 (Week 3): Pt will perform LB bathing with max assist.   Skilled Therapeutic Interventions/Progress Updates:    Pt completed toilet transfers with use of the Stedy to begin session at total assist level.  He  was however able to complete sit to stand from the wheelchair and the toilet with use of the Stedy with mod assist.  Max instructional cueing to maintain left grip and visual attention to the left hand during transition to standing as well.  Total assist +2 (pt 40%) for toilet hygiene in standing.  He was able to next complete UB bathing sitting in the wheelchair  with overall min assist.  Encouraged upright sitting unsupported at times throughout bathing.  Overall mod assist to maintain balance secondary to posterior LOB as well as lean to the left.  Mod assist needed to use the LUE as a stabilizer for holding the washcloth with max assist for hand over hand to wash the right arm.  He needed max assist for crossing the LLE over the right knee for removal of gripper socks with therapist removing TEDs in order for pt to wash the foot.  He was able to wash the right foot with setup and mod instructional cueing for sequencing.  Max assist for donning brief and pants over feet with total assist +2 (pt 40%) for standing to pull items over his hips.  Therapist provided total assist for donning TEDs and gripper socks at end of session with total assist to complete stand pivot transfer to the bed to complete session.  Call button and phone in reach with safety bed alarm in place as well.    Therapy Documentation Precautions:  Precautions Precautions: Fall Precaution Comments: L hemi, SbP <160 Restrictions Weight Bearing Restrictions: No   Pain: Pain Assessment Pain Scale: Faces Pain Score: 0-No pain ADL: See Care Tool Section for some details of ADL  Therapy/Group: Individual Therapy  Maritza Hosterman OTR/L 01/18/2019, 12:01 PM

## 2019-01-18 NOTE — Progress Notes (Signed)
[name] PHYSICAL MEDICINE & REHABILITATION PROGRESS NOTE   Subjective/Complaints: Very talkative today , giving out copies of an arthicle he wrote ROS: Denies CP, shortness of breath, nausea, vomiting, diarrhea.  Objective:   No results found. Recent Labs    01/18/19 0455  WBC 10.5  HGB 14.5  HCT 43.3  PLT 304   Recent Labs    01/18/19 0455  NA 131*  K 3.9  CL 95*  CO2 24  GLUCOSE 139*  BUN 14  CREATININE 0.67  CALCIUM 8.6*    Intake/Output Summary (Last 24 hours) at 01/18/2019 0948 Last data filed at 01/18/2019 0640 Gross per 24 hour  Intake 600 ml  Output 2057 ml  Net -1457 ml     Physical Exam: Vital Signs Blood pressure 121/77, pulse 92, temperature 97.6 F (36.4 C), temperature source Oral, resp. rate 16, height 6\' 1"  (1.854 m), weight 96.2 kg, SpO2 95 %. Constitutional: NAD.  Vital signs reviewed.  Well-developed. HENT: Normocephalic.  Atraumatic. Eyes: EOMI. No discharge. Cardiovascular: No JVD. Respiratory: Normal effort. GI: BS +. Non-distended. Musc: No edema or tenderness in extremities. Neuro: Alert Dysarthria Motor:  Left upper extremity: Approximately 0/5, handgrip 2+/5, stable, 3- 4th and 5th digit ext on Left side today Left lower extremity: 1/5 hip flexion, 0/5 distally unchanged Skin: Warm and dry.  Intact.  Assessment/Plan: 1. 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  Physiatrist is providing close team supervision and 24 hour management of active medical problems listed below.  Physiatrist and rehab team continue to assess barriers to discharge/monitor patient progress toward functional and medical goals  Care Tool:  Bathing    Body parts bathed by patient: Left arm, Chest, Abdomen, Front perineal area, Face, Left upper leg, Right upper leg, Right lower leg   Body parts bathed by helper: Left lower leg,  Buttocks, Right arm Body parts n/a: Left lower leg   Bathing assist Assist Level: Maximal Assistance - Patient 24 - 49%     Upper Body Dressing/Undressing Upper body dressing   What is the patient wearing?: Hospital gown only    Upper body assist Assist Level: Maximal Assistance - Patient 25 - 49%    Lower Body Dressing/Undressing Lower body dressing      What is the patient wearing?: Pants     Lower body assist Assist for lower body dressing: Maximal Assistance - Patient 25 - 49%     Toileting Toileting    Toileting assist Assist for toileting: Maximal Assistance - Patient 25 - 49%     Transfers Chair/bed transfer  Transfers assist  Chair/bed transfer activity did not occur: Safety/medical concerns  Chair/bed transfer assist level: Moderate Assistance - Patient 50 - 74%(lateral scoot with SB)     Locomotion Ambulation   Ambulation assist   Ambulation activity did not occur: Safety/medical concerns  Assist level: 2 helpers Assistive device: Other (comment)(rail) Max distance: 10 ft   Walk 10 feet activity   Assist  Walk 10 feet activity did not occur: Safety/medical concerns  Assist level: 2 helpers Assistive device: Other (comment)(R rail)   Walk 50 feet activity   Assist Walk 50 feet with 2 turns activity did not occur: Safety/medical concerns         Walk 150 feet activity   Assist Walk 150 feet activity did not occur: Safety/medical concerns         Walk 10 feet on  uneven surface  activity   Assist Walk 10 feet on uneven surfaces activity did not occur: Safety/medical Engineer, technical sales activity did not occur: Safety/medical concerns         Wheelchair 50 feet with 2 turns activity    Assist    Wheelchair 50 feet with 2 turns activity did not occur: Safety/medical concerns       Wheelchair 150 feet activity     Assist Wheelchair 150 feet activity did not occur:  Safety/medical concerns        Medical Problem List and Plan: 1.Left side weakness with facial droopas well as left hemi-sensory losssecondary to right thalamic hemorrhage with IVH secondary to hypertensive crisis  Continue CIR, PT, OT, SLP Left WHO and PRAFO ordered, have discussed donning with patient and nursing 2. Antithrombotics: -DVT/anticoagulation:SCDs -antiplatelet therapy: N/A 3. Pain Management:Tylenol as needed 4. Mood:Abilify 5 mg daily, Effexor 300 mg daily. Provide emotional support appreciate psychiatry note -antipsychotic agents: Abilify Major depressive disorder 5. Neuropsych: This patientiscapable of making decisions on hisown behalf. 6. Skin/Wound Care:Routine skin checks 7. Fluids/Electrolytes/Nutrition:Routine ins and outs   Poor p.o. intake- Remeron started on 4/3   Improving 8.  Essential hypertension  Cont Lisinopril 20 mg daily,   Maxide 37.5-25 mg daily DC'd on 4/4 due to hyponatremia  Monitor with increased mobility Vitals:   01/18/19 0752 01/18/19 0800  BP: 130/88 121/77  Pulse:    Resp:    Temp:    SpO2:     Amlodipine increased to 5mg  on 3/29  Controlled on 4/10, elevated HR ? ritalin 9. BPH. Flomax 0.4 mg daily, removed  Foley  10. History of GI bleed . Continue Protonix 11. Hyperlipidemia. Resume Lipitor on discharge. 12.  Constipation-bowel meds increased on 3/30, again on 3/31, again on 4/1     13.  Prediabetes  Diet changed to carb modified   SSI ordered on 4/2   CBG (last 3)  Recent Labs    01/17/19 2117 01/18/19 0640 01/18/19 0739  GLUCAP 131* 145* 138*  Controlled, 01/18/2019 14.  Hyponatremia  Sodium 126 on 4/4, 129 on 4/6, asymptomatic  Improved to 131 4/10  Continue to monitor 15.  Sleep disturbance- improved on remeron, ritalin in am to help with am somnolence  ECG on 3/24 reviewed, trazodone 50 started on 3/30, DC'd on 4/3  Remeron started on 4/3  Sleep chart  ordered  Improving 16.  AKI  Creatinine 0.88 on 4/4  IVF nightly started on 4/3-4/5-echo reviewed, EF stable, BUN normalized 4/6 will DC IV fluids  Encourage fluids  Labs ordered for 4/8 17.  Leukocytosis- WBCs 12.5 on 4/3, 13.8 on 4/6, remains afebrile  Labs ordered 4/8   Continue to monitor  LOS: 14 days A FACE TO FACE EVALUATION WAS PERFORMED  Erick Colace 01/18/2019, 9:48 AM

## 2019-01-18 NOTE — Progress Notes (Signed)
Speech Language Pathology Weekly Progress and Session Note  Patient Details  Name: Patrick Pollard MRN: [id number] Date of Birth: 04-06-1946  Beginning of progress report period:  January 11, 2019 End of progress report period: January 18, 2019  Today's Date: 01/18/2019 SLP Individual Time: 0905-0930 SLP Individual Time Calculation (min): 25 min  Short Term Goals: Week 2: SLP Short Term Goal 1 (Week 2): Pt will complete semi-complex money and medication management tasks with supervision cues.  SLP Short Term Goal 1 - Progress (Week 2): Progressing toward goal SLP Short Term Goal 2 (Week 2): Pt will complete semi-complex reasoning tasks with Min A cues.  SLP Short Term Goal 2 - Progress (Week 2): Progressing toward goal SLP Short Term Goal 3 (Week 2): Pt will demonstrate selective attention in moderately distracting environment for ~ 30 minutes with Min A cues.  SLP Short Term Goal 3 - Progress (Week 2): Met SLP Short Term Goal 4 (Week 2): Pt will improve recall of complex information after lapsed time in 8 out fo 10 opportunities with Min A cues.  SLP Short Term Goal 4 - Progress (Week 2): Met    New Short Term Goals: Week 3: SLP Short Term Goal 1 (Week 3): Pt will complete semi-complex money and medication management tasks with supervision cues.  SLP Short Term Goal 2 (Week 3): Pt will complete semi-complex reasoning tasks with Min A cues.  SLP Short Term Goal 3 (Week 3): Pt will demonstrate selective attention in moderately distracting environment for ~ 30 minutes with supervision cues.  SLP Short Term Goal 4 (Week 3): Pt will improve recall of complex information after lapsed time in 8 out fo 10 opportunities with supervision cues.   Weekly Progress Updates:   Pt has made functional gains this reporting period and has met 2 out of 4 short term goals.  Pt is currently min-mod assist for tasks due to mild-moderate cognitive deficits.  Pt has demonstrated improved attention to tasks and  recall of daily information.  Pt and family education is ongoing.  Pt would continue to benefit from skilled ST while inpatient in order to maximize functional independence and reduce burden of care prior to discharge.  Anticipate that pt will need 24/7 supervision at discharge in addition to [facility] follow up at next level of care    Intensity: Minumum of 1-2 x/day, 30 to 90 minutes Frequency: 3 to 5 out of 7 days Duration/Length of Stay: 4/24 Treatment/Interventions: Cognitive remediation/compensation;Medication managment;Functional tasks;Patient/family education;Therapeutic Activities   Daily Session  Skilled Therapeutic Interventions: Pt was seen for skilled ST targeting cognitive goals.  SLP facilitated the session with a novel scheduling task to address problem solving goals.  Pt needed overall min-mod assist for task organization and attention to detail to complete task for 100% accuracy.  Pt was left in wheelchair with chair alarm set and call bell within reach.  Goals updated on this date to reflect current progress and plan of care.       General    Pain Pain Assessment Pain Scale: 0-10 Pain Score: 0-No pain  Therapy/Group: Individual Therapy  Elea Holtzclaw, Selinda Orion 01/18/2019, 12:43 PM

## 2019-01-18 NOTE — Progress Notes (Signed)
Physical Therapy Session Note  Patient Details  Name: Patrick Pollard MRN: [id number] Date of Birth: 12/03/1945  Today's Date: 01/18/2019 PT Individual Time: 0800-0845 PT Individual Time Calculation (min): 45 min   Short Term Goals: Week 1:  PT Short Term Goal 1 (Week 1): Pt will perform bed mobility with max assist of 1 consistently  PT Short Term Goal 1 - Progress (Week 1): Met PT Short Term Goal 2 (Week 1): Pt Will trasnfer to WC with max assist + 2  PT Short Term Goal 2 - Progress (Week 1): Met PT Short Term Goal 3 (Week 1): Pt will propell WC x 34f with min assist  PT Short Term Goal 3 - Progress (Week 1): Not met PT Short Term Goal 4 (Week 1): Pt will perform sit<>stand with max assist and LRAD  PT Short Term Goal 4 - Progress (Week 1): Not met  Skilled Therapeutic Interventions/Progress Updates:     Patient in bed upon PT arrival. Patient alert and agreeable to PT session.  Therapeutic Activity: Bed Mobility: Patient performed supine to/from sit with mod-max A with log roll technique. Provided verbal cues for pushing through R UE and R LE to roll. Transfers: Patient performed sit to/from stand x2 with max-mod A of 2 people using B HHA and therapist blocking L knee to prevent buckling. Provided verbal cues for leaning forward to stand, brining hips forward and pushing through B LE with erect posture.  Neuromuscular Re-ed: Patient performed weight shifts in standing while striving to maintain midline trunk alignment with mod-max A of 2 people with B UE support HHA. Patient leans to the left and required max A with manual facilitation to shift weight into R LE with PT blocking L knee to prevent buckling. Patient performed reaching with R UE in all directions and across midline sitting on edge of w/c without back support for 4 minutes with CGA for balance and safety and min a x5 to correct lateral lean to the L back to midline after reaching. He also performed L UE shoulder flexion  and elbow flexion PROM with cues to use mental imagery to assist PT with the motion x5, and L LE knee extension x5 with max A for full ROM and tapping to quads to promote muscle activation.   Patient in tilt in space w/c with a pillow behind his L trunk and under his L UE to promote midline sitting and comfort at end of session with breaks locked, seat belt alarm set, and all needs within reach.   Therapy Documentation Precautions:  Precautions Precautions: Fall Precaution Comments: L hemi, SbP <160 Restrictions Weight Bearing Restrictions: No Vital Signs: Therapy Vitals Pulse Rate: 92 BP: 121/77 Patient Position (if appropriate): Lying Oxygen Therapy SpO2: 95 % O2 Device: Room Air Pain: Pain Assessment Pain Scale: 0-10 Pain Score: 0-No pain   Therapy/Group: Individual Therapy  CDoreene Burke PT, DPT 01/18/2019, 8:37 AM

## 2019-01-18 NOTE — Progress Notes (Signed)
Physical Therapy Session Note  Patient Details  Name: Patrick Pollard MRN: [id number] Date of Birth: December 30, 1945  Today's Date: 01/18/2019 PT Individual Time: 8184-0375 PT Individual Time Calculation (min): 59 min   Short Term Goals: Week 2:  PT Short Term Goal 1 (Week 2): Pt will perform supine<>sit with no more than mod assist of 1 PT Short Term Goal 2 (Week 2): Pt will perform bed<>chair transfers using LRAD with no more than mod assist of 1 PT Short Term Goal 3 (Week 2): Patient will perform sit<>stand with +2 maximal assistance PT Short Term Goal 4 (Week 2): Pt will maintain seated midline orientation for at least 1 minute with supervision  Skilled Therapeutic Interventions/Progress Updates:   Pt received supine in bed, in good spirits, and eager to participate with therapy. Pt performed supine to sit, HOB partially elevated and using bedrails, via logroll technique for increased pt independence, multimodal cuing for sequencing of task and mod assist for trunk upright and L LE management. Pt able to verbalize proper set-up of w/c for lateral scoot slide board transfer with min cuing for sequencing and for breaks and armrest. Pt able to place transfer board with mod assist and cuing for proper positioning then performed R lateral scoot slide board transfer EOB to w/c with min/mod assist of 1 for scooting hips. Pt transported to gym in w/c. Pt performed R lateral scoot transfer w/c to EOM - mod assist for transfer board placement and min/mod assist of 1 for scooting hips - cuing for sequencing and head/hips relationship to improve hip clearance. Performed sit to supine with cuing for sequencing of reverse logroll technique using R UE to support trunk descent with mod assist for LE management. Performed rolling supine to sidelying with min assist for rolling and L UE placement and cuing for sequencing. Attempted to perform sidelying to prone for quadriped position but due to L hemibody paresis  unable to perform task. Performed supine bridging 2x10 with R LE extended further to promote increased L LE muscle activation with tactile/verbal cues and manual facilitation for L LE weightbearing and gluteal activation. Performed supine to sit via logroll technique for increased pt independence with cuing for sequencing and mod assist for L hemibody placement in preparation for mobility and for trunk upright. Pt performed dynamic sitting balance task EOM focusing on cross body reaching with R UE to limits of stability with min cuing throughout for trunk recovery due to decreased pt awareness. Pt demonstrates improved ability to lean L and recover without using R UE to prevent L lateral LOB. Pt performed L lateral scoot transfer EOM to w/c using transfer board, total assist for transfer board this direction due to L UE paresis, with mod/max assist of 1 for scooting hips - mod cuing for head/hips relationship for increased hip clearance during transfer and to increase amplitude of movement. Pt transported to room in w/c and requesting assistance returning to bed. Pt performed R lateral scoot w/c to EOB using transfer board with min assist for scooting hips and min cuing for head/hips relationship. Pt performed sit to supine via reverse logroll technique for increased independence and improved positioning in bed - mod assist for trunk descent and B LE management. Pt left supine in bed with needs in reach and bed alarm on.  Therapy Documentation Precautions:  Precautions Precautions: Fall Precaution Comments: L hemi, SbP <160 Restrictions Weight Bearing Restrictions: No  Pain: Patient reports L shoulder pain 1x during session but unrated and no other reports  of pain - repositioning and rest provided for pain relief.   Therapy/Group: Individual Therapy  Ginny Fortharly M Caili Escalera, PT, DPT 01/18/2019, 3:24 PM

## 2019-01-19 ENCOUNTER — Inpatient Hospital Stay (HOSPITAL_COMMUNITY): Payer: Self-pay | Admitting: Physical Therapy

## 2019-01-19 DIAGNOSIS — I619 Nontraumatic intracerebral hemorrhage, unspecified: Secondary | ICD-10-CM

## 2019-01-19 DIAGNOSIS — G8104 Flaccid hemiplegia affecting left nondominant side: Secondary | ICD-10-CM

## 2019-01-19 LAB — GLUCOSE, CAPILLARY
Glucose-Capillary: 161 mg/dL — ABNORMAL HIGH (ref 70–99)
Glucose-Capillary: 144 mg/dL — ABNORMAL HIGH (ref 70–99)
Glucose-Capillary: 144 mg/dL — ABNORMAL HIGH (ref 70–99)
Glucose-Capillary: 198 mg/dL — ABNORMAL HIGH (ref 70–99)

## 2019-01-19 NOTE — Progress Notes (Signed)
Physical Therapy Weekly Progress Note  Patient Details  Name: Patrick Pollard MRN: [id number] Date of Birth: 1945/11/02   Beginning of progress report period: December 12, 2018 End of progress report period: December 19, 2018  Today's Date: 01/19/2019 PT Individual Time: 9233-0076 PT Individual Time Calculation (min): 30 min   Patient has met 4 of 4 short term goals. Patient is progressing well with therapy by demonstrating improvements in dynamic sitting balance, supine<>sit and bed<>chair transfers as well as initiated gait training at hallway rail. Patient continues to demonstrate significant L hemiparesis, impaired L hemibody sensation, impaired L attention, and impaired midline orientation that contribute to increased assistance required for dynamic sitting balance, static and dynamic standing balance, bed mobility,  transfers, and gait.  Patient continues to demonstrate the following deficits muscle weakness and muscle paralysis, decreased cardiorespiratoy endurance, impaired timing and sequencing, abnormal tone, unbalanced muscle activation, decreased coordination and decreased motor planning, decreased midline orientation, decreased attention to left and decreased motor planning, decreased initiation, decreased attention, decreased awareness, decreased problem solving, decreased safety awareness and delayed processing and decreased sitting balance, decreased standing balance, decreased postural control, hemiplegia and decreased balance strategies and therefore will continue to benefit from skilled PT intervention to increase functional independence with mobility.  Patient progressing toward long term goals..  Continue plan of care.  PT Short Term Goals Week 2:  PT Short Term Goal 1 (Week 2): Pt will perform supine<>sit with no more than mod assist of 1 PT Short Term Goal 1 - Progress (Week 2): Met PT Short Term Goal 2 (Week 2): Pt will perform bed<>chair transfers using LRAD with no more than  mod assist of 1 PT Short Term Goal 2 - Progress (Week 2): Met PT Short Term Goal 3 (Week 2): Patient will perform sit<>stand with +2 maximal assistance PT Short Term Goal 3 - Progress (Week 2): Met PT Short Term Goal 4 (Week 2): Pt will maintain seated midline orientation for at least 1 minute with supervision PT Short Term Goal 4 - Progress (Week 2): Met Week 3:  PT Short Term Goal 1 (Week 3): Pt will perform supine<>sit with no more than min assist of 1 PT Short Term Goal 2 (Week 3): Pt will perform bed<>chair transfer using LRAD with no more than min assist of 1 PT Short Term Goal 3 (Week 3): Pt will perform sit<>stand using LRAD with no more than mod assist of 1 PT Short Term Goal 4 (Week 3): Pt will ambulate at least 42f using LRAD with no more than max assist of 1  Skilled Therapeutic Interventions/Progress Updates:  Ambulation/gait training;Balance/vestibular training;Cognitive remediation/compensation;Community reintegration;Discharge planning;Disease management/prevention;DME/adaptive equipment instruction;Functional electrical stimulation;Functional mobility training;Neuromuscular re-education;Patient/family education;Pain management;Psychosocial support;Skin care/wound management;Splinting/orthotics;Stair training;Therapeutic Activities;Therapeutic Exercise;UE/LE Coordination activities;UE/LE Strength taining/ROM;Visual/perceptual remediation/compensation;Wheelchair propulsion/positioning   Pt received supine in bed and eager to participate with therapy. Pt performed supine to sit via logroll technique with mod assist for L hemibody positioning, L LE management off EOB, and trunk upright. Pt participated in dynamic sitting balance task on EOB reaching with R UE toward external target with pt demonstrating increased limits of stability with ability to reach targets further to the L and recover from minor L LOB without assist or R UE support. Pt participated in 3 bouts of repeated sit to  mini-squat position from elevated EOB using R UE support on bedrail focusing on L LE quadriceps and gluteal muscle activation and neuromuscular re-education -  max assist from therapist for lifting hips and blocking L LE  knee as well as min assist of 2nd person for balance/lifting- pt demonstrated active L LE quadriceps muscle activation - verbal/visual cues and manual facilitation for increased L LE weight shifting when lifting. Pt performed R lateral scoot transfer EOB to drop arm recliner with mod assist for scooting hips and verbal/tactile cuing and manual facilitation for increased anterior trunk lean for improved unweighting of hips for scoot. Pt left sitting in recliner with needs in reach and chair alarm on with nursing staff notified.  Therapy Documentation Precautions:  Precautions Precautions: Fall Precaution Comments: L hemi, SbP <160 Restrictions Weight Bearing Restrictions: No  Pain: No reports of pain during therapy session.  Therapy/Group: Individual Therapy  Tawana Scale, PT, DPT 01/19/2019, 4:42 PM

## 2019-01-19 NOTE — Progress Notes (Signed)
Patrick Pollard is a 73 y.o. 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 Anxiety   . Colon polyps    s/p colonoscopy 12/2016 wtih polyp removal   . Depression      Subjective: No new complaints. No new problems.  Uncomfortable night due to 2 episodes of fecal/urinary incontinence  Objective: Vital signs in last 24 hours: Temp:  [98.3 F (36.8 C)-98.5 F (36.9 C)] 98.4 F (36.9 C) (04/11 0436) Pulse Rate:  [90-101] 90 (04/11 0436) Resp:  [17-18] 17 (04/11 0436) BP: (103-158)/(68-95) 158/95 (04/11 0436) SpO2:  [94 %-97 %] 94 % (04/11 0436) Weight change:  Last BM Date: 01/18/19  Intake/Output from previous day: 04/10 0701 - 04/11 0700 In: -  Out: 700 [Urine:700] Last cbgs: CBG (last 3)  Recent Labs    01/18/19 1705 01/18/19 2116 01/19/19 0620  GLUCAP 124* 185* 161*   Patient Vitals for the past 24 hrs:  BP Temp Temp src Pulse Resp SpO2  01/19/19 0436 (!) 158/95 98.4 F (36.9 C) Oral 90 17 94 %  01/18/19 1959 (!) 148/71 98.5 F (36.9 C) Oral (!) 101 18 96 %  01/18/19 1411 103/68 98.3 F (36.8 C) Oral 92 18 97 %     Physical Exam General: No apparent distress   HEENT: not dry Lungs: Normal effort. Lungs clear to auscultation, no crackles or wheezes. Cardiovascular: Irregular rate and rhythm, no edema Abdomen: S/NT/ND; BS(+) Musculoskeletal:  unchanged Neurological: No new neurological deficits with left hemiparesis Extremities Skin: clear  Mental state: Alert, oriented, cooperative    Lab Results: BMET    Component Value Date/Time   NA 131 (L) 01/18/2019 0455   K 3.9 01/18/2019 0455   CL 95 (L) 01/18/2019 0455   CO2 24 01/18/2019 0455   GLUCOSE 139 (H) 01/18/2019 0455   BUN 14 01/18/2019 0455   CREATININE 0.67 01/18/2019 0455   CALCIUM 8.6 (L) 01/18/2019 0455   GFRNONAA >60 01/18/2019 0455   GFRAA >60  01/18/2019 0455   CBC    Component Value Date/Time   WBC 10.5 01/18/2019 0455   RBC 4.95 01/18/2019 0455   HGB 14.5 01/18/2019 0455   HCT 43.3 01/18/2019 0455   PLT 304 01/18/2019 0455   MCV 87.5 01/18/2019 0455   MCH 29.3 01/18/2019 0455   MCHC 33.5 01/18/2019 0455   RDW 13.1 01/18/2019 0455   LYMPHSABS 1.3 01/18/2019 0455   MONOABS 0.8 01/18/2019 0455   EOSABS 0.2 01/18/2019 0455   BASOSABS 0.0 01/18/2019 0455    Medications: I have reviewed the patient's current medications.  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  Continue SCDs Essential hypertension.  Blood pressure has been trending up following discontinuation of diuretic therapy following hypokalemia.  We will continue to monitor on present therapy Dyslipidemia continue statin therapy    Length of stay, days: 15  Gordy Savers , MD 01/19/2019, 11:31 AM

## 2019-01-20 ENCOUNTER — Inpatient Hospital Stay (HOSPITAL_COMMUNITY): Payer: Self-pay

## 2019-01-20 LAB — GLUCOSE, CAPILLARY
Glucose-Capillary: 160 mg/dL — ABNORMAL HIGH (ref 70–99)
Glucose-Capillary: 147 mg/dL — ABNORMAL HIGH (ref 70–99)
Glucose-Capillary: 117 mg/dL — ABNORMAL HIGH (ref 70–99)
Glucose-Capillary: 188 mg/dL — ABNORMAL HIGH (ref 70–99)

## 2019-01-20 NOTE — Progress Notes (Signed)
Physical Therapy Session Note  Patient Details  Name: Patrick Pollard MRN: [id number] Date of Birth: [birth year]  Today's Date: 01/20/2019 PT Individual Time: 1000-1100 PT Individual Time Calculation (min): 60 min   Short Term Goals: Week 3:  PT Short Term Goal 1 (Week 3): Pt will perform supine<>sit with no more than min assist of 1 PT Short Term Goal 2 (Week 3): Pt will perform bed<>chair transfer using LRAD with no more than min assist of 1 PT Short Term Goal 3 (Week 3): Pt will perform sit<>stand using LRAD with no more than mod assist of 1 PT Short Term Goal 4 (Week 3): Pt will ambulate at least 64ft using LRAD with no more than max assist of 1  Skilled Therapeutic Interventions/Progress Updates:    Pt supine in bed upon PT arrival, agreeable to therapy tx and denies pain. Pt incontinent of bladder this session, min-mod assist for rolling to change brief and don pants. Pt transferred to sitting EOB with mod assist, pt maintained sitting balance with stand by assist while therapist assisted to don shirt. Therapist provided pt with standard w/c this session in order to work on w/c propulsion. Pt performed slideboard transfer to w/c with min assist to the R and cues for techniques and sequencing. Pt propelled w/c this session x 150 ft to the gym with instruction in R hemi propulsion techniques, occasional min assist for steering. Pt performed slideboard transfer to the mat with min assist to the R. Pt performed x 2 sit<>stands from mat this session with RW and mod assist, L hand splint on RW for L UE. Pt able to maintain standing balance with min-mod assist, RW for UE support and mirror for visual feedback, therapist blocking L knee, working on midline orientation in standing and pre-gait stepping in place with R LE. Pt transferred to w/c with slideboard and mod assist to the L. Pt propelled back to room with CGA-min assist, left in w/c with needs in reach and chair alarm set.   Therapy  Documentation Precautions:  Precautions Precautions: Fall Precaution Comments: L hemi, SbP <160 Restrictions Weight Bearing Restrictions: No    Therapy/Group: Individual Therapy  Cresenciano Genre, PT, DPT 01/20/2019, 7:59 AM

## 2019-01-20 NOTE — Progress Notes (Signed)
Patrick Pollard is a 73 y.o. male admitted for CIR following a right thalamic hemorrhagic infarction with IVH secondary to hypertensive crisis  Past Medical History:  Diagnosis Date  . Anxiety   . Colon polyps    s/p colonoscopy 12/2016 wtih polyp removal   . Depression      Subjective: No new complaints. No new problems.  Much better night with no further episodes of fecal incontinence  Objective: Vital signs in last 24 hours: Temp:  [98 F (36.7 C)-99.5 F (37.5 C)] 98 F (36.7 C) (04/12 0408) Pulse Rate:  [95-105] 99 (04/12 0408) Resp:  [16-18] 18 (04/12 0408) BP: (125-140)/(73-97) 138/88 (04/12 0816) SpO2:  [94 %-96 %] 94 % (04/12 0408) Weight change:  Last BM Date: 01/19/19  Intake/Output from previous day: 04/11 0701 - 04/12 0700 In: 460 [P.O.:460] Out: 1825 [Urine:1825] Last cbgs: CBG (last 3)  Recent Labs    01/19/19 1718 01/19/19 2108 01/20/19 0616  GLUCAP 144* 198* 160*   Patient Vitals for the past 24 hrs:  BP Temp Temp src Pulse Resp SpO2  01/20/19 0816 138/88 - - - - -  01/20/19 0408 (!) 140/97 98 F (36.7 C) Oral 99 18 94 %  01/19/19 1910 133/90 99.5 F (37.5 C) Oral (!) 105 16 96 %  01/19/19 1721 125/73 98.4 F (36.9 C) Oral 95 18 96 %     Physical Exam General: No apparent distress;  alert and appropriate HEENT: not dry Lungs: Normal effort. Lungs clear to auscultation, no crackles or wheezes. Cardiovascular: Irregular rate and rhythm, no edema Abdomen: S/NT/ND; BS(+) Musculoskeletal:  unchanged Neurological: No new neurological deficits with dense left hemiparesis with left wrist and left lower extremity braces Wounds: N/A    Skin: clear   Mental state: Alert, oriented, cooperative    Lab Results: BMET    Component Value Date/Time   NA 131 (L) 01/18/2019 0455   K 3.9 01/18/2019 0455   CL 95 (L) 01/18/2019 0455   CO2 24 01/18/2019 0455   GLUCOSE 139 (H) 01/18/2019 0455   BUN 14 01/18/2019 0455   CREATININE 0.67 01/18/2019  0455   CALCIUM 8.6 (L) 01/18/2019 0455   GFRNONAA >60 01/18/2019 0455   GFRAA >60 01/18/2019 0455   CBC    Component Value Date/Time   WBC 10.5 01/18/2019 0455   RBC 4.95 01/18/2019 0455   HGB 14.5 01/18/2019 0455   HCT 43.3 01/18/2019 0455   PLT 304 01/18/2019 0455   MCV 87.5 01/18/2019 0455   MCH 29.3 01/18/2019 0455   MCHC 33.5 01/18/2019 0455   RDW 13.1 01/18/2019 0455   LYMPHSABS 1.3 01/18/2019 0455   MONOABS 0.8 01/18/2019 0455   EOSABS 0.2 01/18/2019 0455   BASOSABS 0.0 01/18/2019 0455     Medications: I have reviewed the patient's current medications.  Assessment/Plan:  Functional deficits with left hemiparesis following right thalamic hemorrhagic stroke DVT prophylaxis.  Continue SCDs Essential hypertension.  We will continue to monitor on present regimen Dyslipidemia continue statin therapy History of hypokalemia/hyponatremia.  Diuretic therapy has been discontinued    Length of stay, days: 16  Gordy Savers , MD 01/20/2019, 10:24 AM

## 2019-01-21 ENCOUNTER — Inpatient Hospital Stay (HOSPITAL_COMMUNITY): Payer: Self-pay

## 2019-01-21 ENCOUNTER — Inpatient Hospital Stay (HOSPITAL_COMMUNITY): Payer: Self-pay | Admitting: Physical Therapy

## 2019-01-21 ENCOUNTER — Inpatient Hospital Stay (HOSPITAL_COMMUNITY): Payer: Self-pay | Admitting: Occupational Therapy

## 2019-01-21 LAB — GLUCOSE, CAPILLARY
Glucose-Capillary: 145 mg/dL — ABNORMAL HIGH (ref 70–99)
Glucose-Capillary: 139 mg/dL — ABNORMAL HIGH (ref 70–99)
Glucose-Capillary: 136 mg/dL — ABNORMAL HIGH (ref 70–99)
Glucose-Capillary: 126 mg/dL — ABNORMAL HIGH (ref 70–99)

## 2019-01-21 NOTE — Progress Notes (Signed)
[place] PHYSICAL MEDICINE & REHABILITATION PROGRESS NOTE   Subjective/Complaints: No issues overnite ROS: Denies CP, shortness of breath, nausea, vomiting, diarrhea.  Objective:   No results found. No results for input(s): WBC, HGB, HCT, PLT in the last 72 hours. No results for input(s): NA, K, CL, CO2, GLUCOSE, BUN, CREATININE, CALCIUM in the last 72 hours.  Intake/Output Summary (Last 24 hours) at 01/21/2019 0818 Last data filed at 01/21/2019 0650 Gross per 24 hour  Intake 460 ml  Output 1800 ml  Net -1340 ml     Physical Exam: Vital Signs Blood pressure 129/81, pulse 81, temperature 98.2 F (36.8 C), temperature source Oral, resp. rate 15, height 6\' 1"  (1.854 m), weight 96.2 kg, SpO2 97 %. Constitutional: NAD.  Vital signs reviewed.  Well-developed. HENT: Normocephalic.  Atraumatic. Eyes: EOMI. No discharge. Cardiovascular: No JVD. Respiratory: Normal effort. GI: BS +. Non-distended. Musc: No edema or tenderness in extremities. Neuro: Alert Dysarthria Motor:  Left upper extremity:handgrip, biceps and triceps  3/5, improved   Left lower extremity: 1/5 hip flexion, 1/5 distally plantar flexion but no dorsiflexion Skin: Warm and dry.  Intact.  Assessment/Plan: 1. 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  Physiatrist is providing close team supervision and 24 hour management of active medical problems listed below.  Physiatrist and rehab team continue to assess barriers to discharge/monitor patient progress toward functional and medical goals  Care Tool:  Bathing    Body parts bathed by patient: Left arm, Chest, Abdomen, Front perineal area, Face, Left upper leg, Right upper leg, Right lower leg   Body parts bathed by helper: Left lower leg, Buttocks, Right arm Body parts n/a: Left lower leg   Bathing assist Assist Level: Maximal Assistance -  Patient 24 - 49%     Upper Body Dressing/Undressing Upper body dressing   What is the patient wearing?: Hospital gown only    Upper body assist Assist Level: Maximal Assistance - Patient 25 - 49%    Lower Body Dressing/Undressing Lower body dressing      What is the patient wearing?: Pants, Incontinence brief     Lower body assist Assist for lower body dressing: 2 Helpers     Toileting Toileting    Toileting assist Assist for toileting: Maximal Assistance - Patient 25 - 49%     Transfers Chair/bed transfer  Transfers assist  Chair/bed transfer activity did not occur: Safety/medical concerns  Chair/bed transfer assist level: Moderate Assistance - Patient 50 - 74%     Locomotion Ambulation   Ambulation assist   Ambulation activity did not occur: Safety/medical concerns  Assist level: 2 helpers Assistive device: Other (comment)(rail) Max distance: 10 ft   Walk 10 feet activity   Assist  Walk 10 feet activity did not occur: Safety/medical concerns  Assist level: 2 helpers Assistive device: Other (comment)(R rail)   Walk 50 feet activity   Assist Walk 50 feet with 2 turns activity did not occur: Safety/medical concerns         Walk 150 feet activity   Assist Walk 150 feet activity did not occur: Safety/medical concerns         Walk 10 feet on uneven surface  activity   Assist Walk 10 feet on uneven surfaces activity did not occur: Safety/medical concerns         Wheelchair     Assist Will patient use wheelchair at discharge?: Yes Type of  Wheelchair: Media planner activity did not occur: Safety/medical concerns  Wheelchair assist level: Minimal Assistance - Patient > 75% Max wheelchair distance: 150 ft    Wheelchair 50 feet with 2 turns activity    Assist    Wheelchair 50 feet with 2 turns activity did not occur: Safety/medical concerns   Assist Level: Minimal Assistance - Patient > 75%   Wheelchair 150 feet  activity     Assist Wheelchair 150 feet activity did not occur: Safety/medical concerns   Assist Level: Minimal Assistance - Patient > 75%    Medical Problem List and Plan: 1.Left side weakness with facial droopas well as left hemi-sensory losssecondary to right thalamic hemorrhage with IVH secondary to hypertensive crisis  Continue CIR, PT, OT, SLP Left WHO and PRAFO ordered, have discussed donning with patient and nursing 2. Antithrombotics: -DVT/anticoagulation:SCDs -antiplatelet therapy: N/A 3. Pain Management:Tylenol as needed 4. Mood:Abilify 5 mg daily, Effexor 300 mg daily. Provide emotional support appreciate psychiatry note -antipsychotic agents: Abilify Major depressive disorder 5. Neuropsych: This patientiscapable of making decisions on hisown behalf. 6. Skin/Wound Care:Routine skin checks 7. Fluids/Electrolytes/Nutrition:Routine ins and outs   Poor p.o. intake- Remeron started on 4/3   Improving 8.  Essential hypertension  Cont Lisinopril 20 mg daily,   Maxide 37.5-25 mg daily DC'd on 4/4 due to hyponatremia  Monitor with increased mobility Vitals:   01/20/19 1914 01/21/19 0415  BP: 121/90 129/81  Pulse: 98 81  Resp: 16 15  Temp: 98.2 F (36.8 C) 98.2 F (36.8 C)  SpO2: 97% 97%   Amlodipine increased to 5mg  on 3/29  Controlled on 4/13 9. BPH. Flomax 0.4 mg daily, removed  Foley  10. History of GI bleed . Continue Protonix 11. Hyperlipidemia. Resume Lipitor on discharge. 12.  Constipation-bowel meds increased on 3/30, again on 3/31, again on 4/1     13.  Prediabetes  Diet changed to carb modified   SSI ordered on 4/2   CBG (last 3)  Recent Labs    01/20/19 1643 01/20/19 2110 01/21/19 0620  GLUCAP 117* 188* 145*  Controlled, 01/21/2019 14.  Hyponatremia  Sodium 126 on 4/4, 129 on 4/6, asymptomatic  Improved to 131 4/10  Continue to monitor 15.  Sleep disturbance- improved on remeron, ritalin  in am to help with am somnolence, did not receive yet this am  ECG on 3/24 reviewed, trazodone 50 started on 3/30, DC'd on 4/3  Remeron started on 4/3  Sleep chart ordered  Improving 16.  AKI  Creatinine 0.88 on 4/4  IVF nightly started on 4/3-4/5-echo reviewed, EF stable, BUN normalized 4/6 will DC IV fluids  Encourage fluids  Labs ordered for 4/8 17.  Leukocytosis- WBCs 12.5 on 4/3, 13.8 on 4/6, remains afebrile  Labs ordered 4/8   Continue to monitor  LOS: 17 days A FACE TO FACE EVALUATION WAS PERFORMED  Erick Colace 01/21/2019, 8:18 AM

## 2019-01-21 NOTE — Plan of Care (Signed)
  Problem: Consults Goal: RH STROKE PATIENT EDUCATION Description See Patient Education module for education specifics  Outcome: Progressing   Problem: RH BOWEL ELIMINATION Goal: RH STG MANAGE BOWEL W/MEDICATION W/ASSISTANCE Description STG Manage Bowel with Medication with mod I Assistance.  Outcome: Progressing Flowsheets (Taken 01/21/2019 1418) STG: Pt will manage bowels with medication with assistance: 4-Minimal assistance   Problem: RH SAFETY Goal: RH STG ADHERE TO SAFETY PRECAUTIONS W/ASSISTANCE/DEVICE Description STG Adhere to Safety Precautions With supervision Assistance/Device.  Outcome: Progressing Flowsheets (Taken 01/21/2019 1418) STG:Pt will adhere to safety precautions with assistance/device: 4-Minimal assistance   Problem: RH KNOWLEDGE DEFICIT Goal: RH STG INCREASE KNOWLEDGE OF HYPERTENSION Description Patient/caregiver will verbalize understanding of HTN management including medications, monitoring, follow up visits, and diet with min assist.  Outcome: Progressing Goal: RH STG INCREASE KNOWLEGDE OF HYPERLIPIDEMIA Description Patient/caregiver will verbalize understanding of HLD management including medications, monitoring, follow up visits, and diet with min assist.  Outcome: Progressing Goal: RH STG INCREASE KNOWLEDGE OF STROKE PROPHYLAXIS Description Patient/caregiver will verbalize understanding of stroke prophylaxis management including medications, monitoring, follow up visits, and diet with min assist.  Outcome: Progressing

## 2019-01-21 NOTE — Progress Notes (Signed)
Physical Therapy Session Note  Patient Details  Name: Patrick Pollard MRN: [id number] Date of Birth: October 28, 1945  Today's Date: 01/21/2019 PT Individual Time: 7374-9664 PT Individual Time Calculation (min): 30 min   Short Term Goals: Week 3:  PT Short Term Goal 1 (Week 3): Pt will perform supine<>sit with no more than min assist of 1 PT Short Term Goal 2 (Week 3): Pt will perform bed<>chair transfer using LRAD with no more than min assist of 1 PT Short Term Goal 3 (Week 3): Pt will perform sit<>stand using LRAD with no more than mod assist of 1 PT Short Term Goal 4 (Week 3): Pt will ambulate at least 21f using LRAD with no more than max assist of 1  Skilled Therapeutic Interventions/Progress Updates:    Patient received in bed, very pleasant and willing to work with therapy. First worked on functional exercises for R LE and coordination/faciltiation techniques with hemi (L) LE in bed with max verbal and tactile cues/facilitation provided for hemiplegic side. Threaded feet through pants with totalA, then able to roll side to side with ModA and totalA to pull pants up over hips. Able to then perform supine to sit with ModA, sitting balance at EOB greatly improved and able to maintain midline sitting with close S. Required totalA for placement of sliding board, but was then able to perform lateral scoot transfer [facility] with [place] He was left up in the [facility] comfort and with seat belt alarm active, all needs otherwise met this morning.   Therapy Documentation Precautions:  Precautions Precautions: Fall Precaution Comments: L hemi, SbP <160 Restrictions Weight Bearing Restrictions: No General:   Vital Signs:   Pain: Pain Assessment Pain Scale: 0-10 Pain Score: 0-No pain Faces Pain Scale: No hurt    Therapy/Group: Individual Therapy  KDeniece ReePT, DPT, CBIS  Supplemental Physical Therapist [facility]   Pager [phone] Rehab Office  [phone]  01/21/2019, 12:02 PM

## 2019-01-21 NOTE — Progress Notes (Signed)
Occupational Therapy Session Note  Patient Details  Name: Patrick Pollard MRN: [id number] Date of Birth: Jun 22, 1946  Today's Date: 01/21/2019 OT Individual Time: 1345-1431 OT Individual Time Calculation (min): 46 min    Short Term Goals: Week 3:  OT Short Term Goal 1 (Week 3): Pt will donn pants sit to stand with max assist during session. OT Short Term Goal 2 (Week 3): Pt will donn pullover shirt with min assist in supported sitting.  OT Short Term Goal 3 (Week 3): Pt will complete toilet transfer squat pivot with mod assist to both the left and right.  OT Short Term Goal 4 (Week 3): Pt will use the LUE as a gross assist with selfcare tasks with mod facilitation during bathing tasks. OT Short Term Goal 5 (Week 3): Pt will perform LB bathing with max assist.   Skilled Therapeutic Interventions/Progress Updates:    Upon entering the room, pt supine in bed and NT present and recently being cleaned from incontinence episode. Pt agreeable to OT intervention pt reports feeling , " Off". Pt unable to describe further. Pt does report pain in L hip and OT stretching in all planes of movement with pt reporting decrease in pain after stretches completed. Supine >sit with mod A for L LE and trunk to EOB. Pt sitting on EOB with static sitting and close supervision. Pt standing x 2 reps for 3 minutes each time with max A and heavy blocking on L knee. Pt needing mod cuing for upright posture and weight shifting while standing. Pt reported feeling dizzy with standing and BP taken of 121/82 while sitting with HR of 105. Pt standing with BP changing to 138/115 and HR pf 117 bpm. Pt taking seated rest break and BP dropping to 126/87 and HR decreasing to 109. Pt reports increased fatigue and requesting to return to bed. Sit >supine with max A for safety. OT repositioned pt in bed with call bell and all needed items within reach upon exiting the room.    Therapy Documentation Precautions:   Precautions Precautions: Fall Precaution Comments: L hemi, SbP <160 Restrictions Weight Bearing Restrictions: No   Pain: Pain Assessment Pain Scale: 0-10 Pain Score: 0-No pain Faces Pain Scale: No hurt   Therapy/Group: Individual Therapy  Alen Bleacher 01/21/2019, 2:35 PM

## 2019-01-21 NOTE — Progress Notes (Signed)
Speech Language Pathology Daily Session Note  Patient Details  Name: Patrick Pollard MRN: [id number] Date of Birth: 08/14/1946  Today's Date: 01/21/2019 SLP Individual Time: 1030-1130 SLP Individual Time Calculation (min): 60 min  Short Term Goals: Week 3: SLP Short Term Goal 1 (Week 3): Pt will complete semi-complex money and medication management tasks with supervision cues.  SLP Short Term Goal 2 (Week 3): Pt will complete semi-complex reasoning tasks with Min A cues.  SLP Short Term Goal 3 (Week 3): Pt will demonstrate selective attention in moderately distracting environment for ~ 30 minutes with supervision cues.  SLP Short Term Goal 4 (Week 3): Pt will improve recall of complex information after lapsed time in 8 out fo 10 opportunities with supervision cues.   Skilled Therapeutic Interventions: Skilled ST services focused on cognitive skills. Pt was asleep upon entering, however easily aroused. SLP facilitated semi-complex reasoning task (selecting date from calendar based on inferencal sentences x5) pt required max A verbal cues for attention, however repositioned on edged of bed, pt required supervision A verbal cues for error awareness and utilzling of executive function strategies in remaining 3 out 3 opportunites. SLP facilitated complex problem solving pertaining to organization inferencing (closest organization task), pt required max A verbal cues in organizating 6 out 10 items, likely due to increase complexity of language ( two items in each sentence compared to one item in calendar task) and pt requested to lay back in bed to complete task due to left leaning. Pt was left in room with call bell within reach and bed  alarm set. ST recommends to continue skilled ST services.      Pain Pain Assessment Pain Scale: 0-10 Pain Score: 0-No pain Faces Pain Scale: No hurt  Therapy/Group: Individual Therapy  Vanshika Jastrzebski  [facility] 01/21/2019, 12:43 PM

## 2019-01-21 NOTE — Progress Notes (Signed)
Physical Therapy Session Note  Patient Details  Name: Patrick Pollard MRN: [id number] Date of Birth: 15-Oct-1945  Today's Date: 01/21/2019 PT Individual Time: 1546-1700 PT Individual Time Calculation (min): 74 min   Short Term Goals: Week 3:  PT Short Term Goal 1 (Week 3): Pt will perform supine<>sit with no more than min assist of 1 PT Short Term Goal 2 (Week 3): Pt will perform bed<>chair transfer using LRAD with no more than min assist of 1 PT Short Term Goal 3 (Week 3): Pt will perform sit<>stand using LRAD with no more than mod assist of 1 PT Short Term Goal 4 (Week 3): Pt will ambulate at least 30ft using LRAD with no more than max assist of 1  Skilled Therapeutic Interventions/Progress Updates:    Pt supine in bed upon PT arrival, agreeable to therapy tx and denies pain. Pt transferred to sitting with mod assist, slideboard transfer to w/c towards R with min assist and cues for techniques. Pt propelled w/c x 60 ft with min assist, cues for R hemi propulsion techniques and therapist transported the rest of the way. Slideboard transfer to mat with min assist to the R. Pt performed sit<>stands x4 from elevated mat this session with RW and mod assist. In standing pt worked on static standing balance with RW and midline orientation with use of mirror for visual feedback, min-mod assist with therapist blocking L knee. In standing worked on L LE weightbearing and stance control to perform R LE stepping in place x 4 with RW for UE support, mod assist, x 2 trials with mirror for feedback. Pt standing with RW worked on L LE hip flexor activation/initiation for swing phase initiation, DF ace wrap on L LE, x 3 steps. Slideboard transfer to the L with mod assist into w/c. Pt ambulated x 15 ft this session with max assist at the rail, shoe on R foot, DF ace wrap on L foot, pt able to advance L LE during swing however continues to require max assist for L knee block during stance. Pt worked on L hip flexor  activation and swing phase while standing at rail to perform cone kicking activity, x 6 kicks with manual facilitation for hip flexion. . Pt transported outside, worked on w/c propulsion on unlevel surfaces 2 x 40 ft with min assist for steering. Pt transported back to room and left in w/c with needs in reach and chair alarm set.   Therapy Documentation Precautions:  Precautions Precautions: Fall Precaution Comments: L hemi, SbP <160 Restrictions Weight Bearing Restrictions: No    Therapy/Group: Individual Therapy  Cresenciano Genre, PT, DPT  01/21/2019, 7:53 AM

## 2019-01-22 ENCOUNTER — Inpatient Hospital Stay (HOSPITAL_COMMUNITY): Payer: Self-pay | Admitting: Occupational Therapy

## 2019-01-22 ENCOUNTER — Inpatient Hospital Stay (HOSPITAL_COMMUNITY): Payer: Self-pay

## 2019-01-22 LAB — GLUCOSE, CAPILLARY
Glucose-Capillary: 174 mg/dL — ABNORMAL HIGH (ref 70–99)
Glucose-Capillary: 130 mg/dL — ABNORMAL HIGH (ref 70–99)
Glucose-Capillary: 144 mg/dL — ABNORMAL HIGH (ref 70–99)
Glucose-Capillary: 178 mg/dL — ABNORMAL HIGH (ref 70–99)

## 2019-01-22 NOTE — Plan of Care (Signed)
  Problem: Consults Goal: RH STROKE PATIENT EDUCATION Description See Patient Education module for education specifics  Outcome: Progressing   Problem: RH BOWEL ELIMINATION Goal: RH STG MANAGE BOWEL W/MEDICATION W/ASSISTANCE Description STG Manage Bowel with Medication with mod I Assistance.  Outcome: Progressing   Problem: RH SAFETY Goal: RH STG ADHERE TO SAFETY PRECAUTIONS W/ASSISTANCE/DEVICE Description STG Adhere to Safety Precautions With supervision Assistance/Device.  Outcome: Progressing   Problem: RH KNOWLEDGE DEFICIT Goal: RH STG INCREASE KNOWLEDGE OF HYPERTENSION Description Patient/caregiver will verbalize understanding of HTN management including medications, monitoring, follow up visits, and diet with min assist.  Outcome: Progressing Goal: RH STG INCREASE KNOWLEGDE OF HYPERLIPIDEMIA Description Patient/caregiver will verbalize understanding of HLD management including medications, monitoring, follow up visits, and diet with min assist.  Outcome: Progressing Goal: RH STG INCREASE KNOWLEDGE OF STROKE PROPHYLAXIS Description Patient/caregiver will verbalize understanding of stroke prophylaxis management including medications, monitoring, follow up visits, and diet with min assist.  Outcome: Progressing   

## 2019-01-22 NOTE — Progress Notes (Signed)
[place] PHYSICAL MEDICINE & REHABILITATION PROGRESS NOTE   Subjective/Complaints: Reminiscing about childhood ROS: Denies CP, shortness of breath, nausea, vomiting, diarrhea.  Objective:   No results found. No results for input(s): WBC, HGB, HCT, PLT in the last 72 hours. No results for input(s): NA, K, CL, CO2, GLUCOSE, BUN, CREATININE, CALCIUM in the last 72 hours.  Intake/Output Summary (Last 24 hours) at 01/22/2019 0749 Last data filed at 01/22/2019 0450 Gross per 24 hour  Intake 360 ml  Output 879 ml  Net -519 ml     Physical Exam: Vital Signs Blood pressure 130/84, pulse 92, temperature 97.7 F (36.5 C), temperature source Oral, resp. rate 17, height 6\' 1"  (1.854 m), weight 96.2 kg, SpO2 99 %. Constitutional: NAD.  Vital signs reviewed.  Well-developed. HENT: Normocephalic.  Atraumatic. Eyes: EOMI. No discharge. Cardiovascular: No JVD. Respiratory: Normal effort. GI: BS +. Non-distended. Musc: No edema or tenderness in extremities. Neuro: Alert Dysarthria Motor:  Left upper extremity:handgrip, biceps and triceps  3/5, improved   Left lower extremity: 1/5 hip flexion, 1/5 distally plantar flexion but no dorsiflexion Skin: Warm and dry.  Intact.  Assessment/Plan: 1. 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  Physiatrist is providing close team supervision and 24 hour management of active medical problems listed below.  Physiatrist and rehab team continue to assess barriers to discharge/monitor patient progress toward functional and medical goals  Care Tool:  Bathing    Body parts bathed by patient: Left arm, Chest, Abdomen, Front perineal area, Face, Left upper leg, Right upper leg, Right lower leg   Body parts bathed by helper: Left lower leg, Buttocks, Right arm Body parts n/a: Left lower leg   Bathing assist Assist Level: Maximal Assistance  - Patient 24 - 49%     Upper Body Dressing/Undressing Upper body dressing   What is the patient wearing?: Hospital gown only    Upper body assist Assist Level: Maximal Assistance - Patient 25 - 49%    Lower Body Dressing/Undressing Lower body dressing      What is the patient wearing?: Pants, Incontinence brief     Lower body assist Assist for lower body dressing: 2 Helpers     Toileting Toileting    Toileting assist Assist for toileting: Maximal Assistance - Patient 25 - 49%     Transfers Chair/bed transfer  Transfers assist  Chair/bed transfer activity did not occur: Safety/medical concerns  Chair/bed transfer assist level: Minimal Assistance - Patient > 75%     Locomotion Ambulation   Ambulation assist   Ambulation activity did not occur: Safety/medical concerns  Assist level: 2 helpers Assistive device: Other (comment)(rail) Max distance: 15 ft   Walk 10 feet activity   Assist  Walk 10 feet activity did not occur: Safety/medical concerns  Assist level: 2 helpers Assistive device: Other (comment)(rail)   Walk 50 feet activity   Assist Walk 50 feet with 2 turns activity did not occur: Safety/medical concerns         Walk 150 feet activity   Assist Walk 150 feet activity did not occur: Safety/medical concerns         Walk 10 feet on uneven surface  activity   Assist Walk 10 feet on uneven surfaces activity did not occur: Safety/medical concerns         Wheelchair     Assist Will patient use wheelchair at discharge?: Yes Type of Wheelchair: Manual  Wheelchair activity did not occur: Safety/medical concerns  Wheelchair assist level: Minimal Assistance - Patient > 75% Max wheelchair distance: 60 ft    Wheelchair 50 feet with 2 turns activity    Assist    Wheelchair 50 feet with 2 turns activity did not occur: Safety/medical concerns   Assist Level: Minimal Assistance - Patient > 75%   Wheelchair 150 feet  activity     Assist Wheelchair 150 feet activity did not occur: Safety/medical concerns   Assist Level: Minimal Assistance - Patient > 75%    Medical Problem List and Plan: 1.Left side weakness with facial droopas well as left hemi-sensory losssecondary to right thalamic hemorrhage with IVH secondary to hypertensive crisis  Continue CIR, PT, OT, SLP Team conf in am 2. Antithrombotics: -DVT/anticoagulation:SCDs -antiplatelet therapy: N/A 3. Pain Management:Tylenol as needed 4. Mood:Abilify 5 mg daily, Effexor 300 mg daily. Provide emotional support appreciate psychiatry note -antipsychotic agents: Abilify Major depressive disorder 5. Neuropsych: This patientiscapable of making decisions on hisown behalf. 6. Skin/Wound Care:Routine skin checks 7. Fluids/Electrolytes/Nutrition:Routine ins and outs   Poor p.o. intake- Remeron started on 4/3, I 360ml on 4/13   8.  Essential hypertension  Cont Lisinopril 20 mg daily,   Maxide 37.5-25 mg daily DC'd on 4/4 due to hyponatremia  Monitor with increased mobility Vitals:   01/22/19 0447 01/22/19 0500  BP: 130/84   Pulse:    Resp:    Temp: 97.7 F (36.5 C)   SpO2:  99%   Amlodipine increased to 5mg  on 3/29  Controlled on 4/14 9. BPH. Flomax 0.4 mg daily, removed  Foley  10. History of GI bleed . Continue Protonix 11. Hyperlipidemia. Resume Lipitor on discharge. 12.  Constipation-bowel meds increased on 3/30, again on 3/31, again on 4/1     13.  Prediabetes  Diet changed to carb modified   SSI ordered on 4/2   CBG (last 3)  Recent Labs    01/21/19 1723 01/21/19 2108 01/22/19 0605  GLUCAP 136* 126* 174*  Controlled, 01/21/2019 14.  Hyponatremia  Sodium 126 on 4/4, 129 on 4/6, asymptomatic  Improved to 131 4/10  Continue to monitor 15.  Sleep disturbance- improved on remeron, ritalin in am to help with am somnolence, increased to 10mg  ECG on 3/24 reviewed, trazodone 50  started on 3/30, DC'd on 4/3  Remeron started on 4/3  Sleep chart ordered  Improving416.  AKI  Creatinine 0.88 on 4/4  IVF nightly started on 4/3-4/5-echo reviewed, EF stable, BUN normalized 4/6 will DC IV fluids  Encourage fluids  Labs ordered for 4/8 17.  Leukocytosis- WBCs 12.5 on 4/3, 13.8 on 4/6, remains afebrile  Labs ordered 4/8   Continue to monitor  LOS: 18 days A FACE TO FACE EVALUATION WAS PERFORMED  Erick Colacendrew E Melanny Wire 01/22/2019, 7:49 AM

## 2019-01-22 NOTE — Progress Notes (Signed)
Speech Language Pathology Daily Session Note  Patient Details  Name: Patrick Pollard MRN: [id number] Date of Birth: 1946/07/24  Today's Date: 01/22/2019 SLP Individual Time: 9784-7841 SLP Individual Time Calculation (min): 45 min  Short Term Goals: Week 3: SLP Short Term Goal 1 (Week 3): Pt will complete semi-complex money and medication management tasks with supervision cues.  SLP Short Term Goal 2 (Week 3): Pt will complete semi-complex reasoning tasks with Min A cues.  SLP Short Term Goal 3 (Week 3): Pt will demonstrate selective attention in moderately distracting environment for ~ 30 minutes with supervision cues.  SLP Short Term Goal 4 (Week 3): Pt will improve recall of complex information after lapsed time in 8 out fo 10 opportunities with supervision cues.   Skilled Therapeutic Interventions: Skilled ST services focused on cognitive skills. Pt recalled yesterday's ST activities with supervision A verbal cues. Pt requested to sit on edge of bed for cognitive task. SLP facilitated complex problem solving sklls with x2 organization and inferenceing tasks, pt required supervision A verbal cues for use of organization strategies/problem solving for 1st task, however 2nd task pt required min A verbal cues for organization/problem solving, pt stated due to back discomfort sitting up/trouble with glasses and feeling "stressed" about the task. SLP provided emotional support and strategies to break down problems when they appear to be overwhelming, pt agreed. Pt required supervision A verbal cues for selective attention during 50 minute tasks with door open.  Pt was left in room with call bell within reach and bed alarm set. ST recommends to continue skilled ST services.      Pain Pain Assessment Pain Scale: Faces Pain Score: 0-No pain  Therapy/Group: Individual Therapy  Natthew Marlatt  [facility] 01/22/2019, 3:32 PM

## 2019-01-22 NOTE — Progress Notes (Signed)
Occupational Therapy Session Note  Patient Details  Name: Patrick Pollard MRN: [id number] Date of Birth: Apr 18, 1946  Today's Date: 01/22/2019 OT Individual Time: 1030-1130 OT Individual Time Calculation (min): 60 min    Short Term Goals: Week 3:  OT Short Term Goal 1 (Week 3): Pt will donn pants sit to stand with max assist during session. OT Short Term Goal 2 (Week 3): Pt will donn pullover shirt with min assist in supported sitting.  OT Short Term Goal 3 (Week 3): Pt will complete toilet transfer squat pivot with mod assist to both the left and right.  OT Short Term Goal 4 (Week 3): Pt will use the LUE as a gross assist with selfcare tasks with mod facilitation during bathing tasks. OT Short Term Goal 5 (Week 3): Pt will perform LB bathing with max assist.   Skilled Therapeutic Interventions/Progress Updates:    Pt completed transfer from EOB to the shower bench with use of the Stedy.  Mod assist for sit to stand from the EOB with integration of the RUE for holding onto the [facility] bar as well with mod instructional cueing and min assist.  He was able to complete UB bathing with overall min assist.  Max hand over hand assistance needed for using the LUE to wash the right arm and shoulder.  Max assist for sit to stand and standing during LB bathing as well with use of the grab bar on the right side.  He was able to complete UB dressing with mod assist after setup of left shirt sleeve.  Max assist for donning brief and pants sit to stand as well as TEDs and shoes with shoe buttons.  Pt still with decreased selective attention, requiring mod instructional cueing to work on bathing and dressing tasks when distracted with conversation.  Finished session with pt sitting in the wheelchair with call button and phone in reach and LUE supported on half lap tray.  Call button and phone in reach with safety alarm belt in place.  NT notified to assist pt with oral hygiene as well.   Therapy  Documentation Precautions:  Precautions Precautions: Fall Precaution Comments: L hemi, SbP <160 Restrictions Weight Bearing Restrictions: No  Pain: Pain Assessment Pain Scale: Faces Pain Score: 0-No pain ADL: See Care Tool Section for some details of ADL  Therapy/Group: Individual Therapy  Leondro Coryell OTR/L 01/22/2019, 11:43 AM

## 2019-01-22 NOTE — Progress Notes (Signed)
Physical Therapy Session Note  Patient Details  Name: Patrick Pollard MRN: [id number] Date of Birth: 1946-07-03  Today's Date: 01/22/2019 PT Individual Time: 1330-1445 PT Individual Time Calculation (min): 75 min   Short Term Goals: Week 3:  PT Short Term Goal 1 (Week 3): Pt will perform supine<>sit with no more than min assist of 1 PT Short Term Goal 2 (Week 3): Pt will perform bed<>chair transfer using LRAD with no more than min assist of 1 PT Short Term Goal 3 (Week 3): Pt will perform sit<>stand using LRAD with no more than mod assist of 1 PT Short Term Goal 4 (Week 3): Pt will ambulate at least 4ft using LRAD with no more than max assist of 1  Skilled Therapeutic Interventions/Progress Updates:    Pt supine in bed upon PT arrival, agreeable to therapy tx and denies pain. Pt called daughter at start of session, therapist discussed taking home measurements in order to prepare for d/c home. Pt transferred to sitting with mod assist. Slideboard transfer to the R with min assist, cues for board placement and weightshifting. Pt transported to the gym. Pt ambulated 2 x 15 ft this session using R rail and max assist +2 for w/c follow, shoe worn on R LE and sock on L LE for easier foot clearance, ace wrap added to L LE for DF/foot clearance, therapist blocking L knee during stance and pt able to advance L LE during swing with hip flexor activation. Pt transported to the dayroom. Slideboard transfer to mat with min assist to the R. Pt worked on standing tolerance, standing balance, lateral weightshifting and L LE weightbearing with use of litegait system this session, verbal/tactile cues for increased L hip/knee extension. Pt ambulated x 6 ft within litegait harness system with +2 assist, manual facilitation for weightshifting, cues for upright posture, therapist blocking L knee during L stance. Pt transferred from w/c>standing>tall kneeling on mat with UE support on bench with max assist +2, pt in tall  kneeling working on glute activation and postural control with tactile cues and +2 assist. Pt transferred tall kneeling>sitting with +2 assist. Pt performed sit<>stand from elevated mat with RW and mod assist, in standing worked on midline orientation and static standing balance with and without UE support, min-mod assist. Pt transferred back to w/c using slideboard and mod assist. Pt transported back to room and left with needs in reach and chair alarm set.   Therapy Documentation Precautions:  Precautions Precautions: Fall Precaution Comments: L hemi, SbP <160 Restrictions Weight Bearing Restrictions: No    Therapy/Group: Individual Therapy  Cresenciano Genre, PT, DPT 01/22/2019, 7:54 AM

## 2019-01-23 ENCOUNTER — Inpatient Hospital Stay (HOSPITAL_COMMUNITY): Payer: Self-pay | Admitting: Occupational Therapy

## 2019-01-23 ENCOUNTER — Inpatient Hospital Stay (HOSPITAL_COMMUNITY): Payer: Self-pay

## 2019-01-23 ENCOUNTER — Inpatient Hospital Stay (HOSPITAL_COMMUNITY): Payer: Self-pay | Admitting: Speech Pathology

## 2019-01-23 LAB — GLUCOSE, CAPILLARY
Glucose-Capillary: 164 mg/dL — ABNORMAL HIGH (ref 70–99)
Glucose-Capillary: 127 mg/dL — ABNORMAL HIGH (ref 70–99)
Glucose-Capillary: 135 mg/dL — ABNORMAL HIGH (ref 70–99)
Glucose-Capillary: 154 mg/dL — ABNORMAL HIGH (ref 70–99)

## 2019-01-23 NOTE — Progress Notes (Deleted)
Pt needed to be cathed this morning. Last night she kept voiding very little amounts frequently. Scanned her post void this morning and had 800mL, got out 1000mL with a straight cath. 

## 2019-01-23 NOTE — Progress Notes (Signed)
Occupational Therapy Session Note  Patient Details  Name: Patrick Pollard MRN: [id number] Date of Birth: March 25, 1946  Today's Date: 01/23/2019 OT Individual Time: 0901-1005 OT Individual Time Calculation (min): 64 min    Short Term Goals: Week 3:  OT Short Term Goal 1 (Week 3): Pt will donn pants sit to stand with max assist during session. OT Short Term Goal 2 (Week 3): Pt will donn pullover shirt with min assist in supported sitting.  OT Short Term Goal 3 (Week 3): Pt will complete toilet transfer squat pivot with mod assist to both the left and right.  OT Short Term Goal 4 (Week 3): Pt will use the LUE as a gross assist with selfcare tasks with mod facilitation during bathing tasks. OT Short Term Goal 5 (Week 3): Pt will perform LB bathing with max assist.   Skilled Therapeutic Interventions/Progress Updates:    Pt completed bathing and dressing sit to stand at the sink during session.  Mod instructional cueing for selective attention during selfcare tasks secondary to decreased selective attention.  Max assist for squat pivot transfers to the wheelchair to the right.  Focused on sponge bathing at the sink per pt's choice this session.  Mod assist for dynamic sitting balance during bathing, with increased lean/pushing to the left with all tasks when sitting unsupported.  Max assist for LB bathing sit to stand as well as for LB dressing.  He needed max assist for UB dressing to donn pullover shirt this session.  He was able to complete grooming task of brushing his hair with setup but needed mod assist for donning deodorant.  Finished session with pt in the wheelchair with call button in place and safety alarm setup.  Phone and beverage in reach as well with LUE supported on half lap tray.    Therapy Documentation Precautions:  Precautions Precautions: Fall Precaution Comments: L hemi, SbP <160 Restrictions Weight Bearing Restrictions: No   Pain: Pain Assessment Pain Scale: Faces Pain  Score: 0-No pain ADL: See Care Tool Section for some details of ADL  Therapy/Group: Individual Therapy  Williams Dietrick OTR/L 01/23/2019, 11:06 AM

## 2019-01-23 NOTE — Progress Notes (Signed)
Orthopedic Tech Progress Note Patient Details:  Patrick Pollard 1946-04-25 [id number] Called in order to [facility] Patient ID: [name], male   DOB: [birth year], 73 y.o.   MRN: [id number]   [name] 01/23/2019, 3:17 PM

## 2019-01-23 NOTE — Progress Notes (Signed)
Speech Language Pathology Daily Session Note  Patient Details  Name: Patrick Pollard MRN: [id number] Date of Birth: 29-Dec-1945  Today's Date: 01/23/2019 SLP Individual Time: 2500-3704 SLP Individual Time Calculation (min): 45 min  Short Term Goals: Week 3: SLP Short Term Goal 1 (Week 3): Pt will complete semi-complex money and medication management tasks with supervision cues.  SLP Short Term Goal 2 (Week 3): Pt will complete semi-complex reasoning tasks with Min A cues.  SLP Short Term Goal 3 (Week 3): Pt will demonstrate selective attention in moderately distracting environment for ~ 30 minutes with supervision cues.  SLP Short Term Goal 4 (Week 3): Pt will improve recall of complex information after lapsed time in 8 out fo 10 opportunities with supervision cues.   Skilled Therapeutic Interventions: Pt was seen for skilled ST intervention targeting aforementioned goals. Pt was pleasant and cooperative with unfamiliar therapist. Pt was able to recall specific long term memory information, and was able to discuss treatment focus for PT, OT and speech therapies. Pt completed level 1 functional math problems verbally, with 80% accuracy. Pt reports feeling comfortable with returning to money management at discharge, but quickly adds that his daughter will be able to help him as well.   Pain Pain Assessment Pain Scale: 0-10 Pain Score: 0-No pain  Therapy/Group: Individual Therapy  Celia B. Murvin Natal, [facility], CCC-SLP Speech Language Pathologist  Leigh Aurora  01/23/2019, 12:17 PM

## 2019-01-23 NOTE — Progress Notes (Signed)
Occupational Therapy Session Note  Patient Details  Name: Patrick Pollard MRN: [id number] Date of Birth: 1946/03/05  Today's Date: 01/23/2019 OT Individual Time: 3295-1884 OT Individual Time Calculation (min): 47 min    Short Term Goals: Week 3:  OT Short Term Goal 1 (Week 3): Pt will donn pants sit to stand with max assist during session. OT Short Term Goal 2 (Week 3): Pt will donn pullover shirt with min assist in supported sitting.  OT Short Term Goal 3 (Week 3): Pt will complete toilet transfer squat pivot with mod assist to both the left and right.  OT Short Term Goal 4 (Week 3): Pt will use the LUE as a gross assist with selfcare tasks with mod facilitation during bathing tasks. OT Short Term Goal 5 (Week 3): Pt will perform LB bathing with max assist.   Skilled Therapeutic Interventions/Progress Updates:    Pt completed squat pivot transfer from the wheelchair to the therapy mat with mod assist to the left.  He was able to work on sit to stand and functional mobility.  Max assist for sit to stand from the EOM.  Total assist +2 (pt 35%) for functional mobility for distances of 20' and 35'.  He needed max facilitation for advancement of the LLE as well as for knee and hip stability when in stance phase and stepping forward with the RLE.  Mod facilitation needed to maintain upright thoracic and cervical extension as well.   Discussed progress with pt as well as possible need for Stedy to assist with transfers,  LB dressing, and toileting at discharge.  Pt is going to look into purchasing this.  Finished session with return to the room with pt sitting up in the wheelchair in preparation for next PT session.    Therapy Documentation Precautions:  Precautions Precautions: Fall Precaution Comments: L hemi, SbP <160 Restrictions Weight Bearing Restrictions: No  Pain: Pain Assessment Pain Scale: Faces Pain Score: 0-No pain  Therapy/Group: Individual Therapy  Zoltan Genest  OTR/L 01/23/2019, 3:59 PM

## 2019-01-23 NOTE — Progress Notes (Signed)
[place] PHYSICAL MEDICINE & REHABILITATION PROGRESS NOTE   Subjective/Complaints: Slept poorly but does not know why ROS: Denies CP, shortness of breath, nausea, vomiting, diarrhea.  Objective:   No results found. No results for input(s): WBC, HGB, HCT, PLT in the last 72 hours. No results for input(s): NA, K, CL, CO2, GLUCOSE, BUN, CREATININE, CALCIUM in the last 72 hours.  Intake/Output Summary (Last 24 hours) at 01/23/2019 0931 Last data filed at 01/23/2019 0740 Gross per 24 hour  Intake 480 ml  Output 400 ml  Net 80 ml     Physical Exam: Vital Signs Blood pressure 120/77, pulse 98, temperature 98.6 F (37 C), resp. rate 18, height 6' 1"  (1.854 m), weight 96.2 kg, SpO2 94 %. Constitutional: NAD.  Vital signs reviewed.  Well-developed. HENT: Normocephalic.  Atraumatic. Eyes: EOMI. No discharge. Cardiovascular: No JVD. Respiratory: Normal effort. GI: BS +. Non-distended. Musc: No edema or tenderness in extremities. Neuro: Alert Dysarthria Motor:  Left upper extremity:handgrip, biceps and triceps  3/5, improved   Left lower extremity: 1/5 hip flexion, 1/5 distally plantar flexion but no dorsiflexion Skin: Warm and dry.  Intact.  Assessment/Plan: 1. 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  Physiatrist is providing close team supervision and 24 hour management of active medical problems listed below.  Physiatrist and rehab team continue to assess barriers to discharge/monitor patient progress toward functional and medical goals  Care Tool:  Bathing    Body parts bathed by patient: Left arm, Chest, Abdomen, Front perineal area, Face, Left upper leg, Right upper leg, Right lower leg, Left lower leg   Body parts bathed by helper: Right arm, Buttocks Body parts n/a: Left lower leg   Bathing assist Assist Level: Maximal Assistance - Patient 24 - 49%      Upper Body Dressing/Undressing Upper body dressing   What is the patient wearing?: Pull over shirt    Upper body assist Assist Level: Moderate Assistance - Patient 50 - 74%    Lower Body Dressing/Undressing Lower body dressing      What is the patient wearing?: Pants, Incontinence brief     Lower body assist Assist for lower body dressing: Maximal Assistance - Patient 25 - 49%     Toileting Toileting    Toileting assist Assist for toileting: Maximal Assistance - Patient 25 - 49%     Transfers Chair/bed transfer  Transfers assist  Chair/bed transfer activity did not occur: Safety/medical concerns  Chair/bed transfer assist level: Minimal Assistance - Patient > 75%(slideboard to R)     Locomotion Ambulation   Ambulation assist   Ambulation activity did not occur: Safety/medical concerns  Assist level: 2 helpers Assistive device: Other (comment)(rail) Max distance: 15 ft   Walk 10 feet activity   Assist  Walk 10 feet activity did not occur: Safety/medical concerns  Assist level: 2 helpers Assistive device: Other (comment)(Rail)   Walk 50 feet activity   Assist Walk 50 feet with 2 turns activity did not occur: Safety/medical concerns         Walk 150 feet activity   Assist Walk 150 feet activity did not occur: Safety/medical concerns         Walk 10 feet on uneven surface  activity   Assist Walk 10 feet on uneven surfaces activity did not occur: Safety/medical concerns         Wheelchair     Assist Will patient use  wheelchair at discharge?: Yes Type of Wheelchair: Manual Wheelchair activity did not occur: Safety/medical concerns  Wheelchair assist level: Minimal Assistance - Patient > 75% Max wheelchair distance: 60 ft    Wheelchair 50 feet with 2 turns activity    Assist    Wheelchair 50 feet with 2 turns activity did not occur: Safety/medical concerns   Assist Level: Minimal Assistance - Patient > 75%    Wheelchair 150 feet activity     Assist Wheelchair 150 feet activity did not occur: Safety/medical concerns   Assist Level: Minimal Assistance - Patient > 75%    Medical Problem List and Plan: 1.Left side weakness with facial droopas well as left hemi-sensory losssecondary to right thalamic hemorrhage with IVH secondary to hypertensive crisis  Continue CIR, PT, OT, SLP Team conference today please see physician documentation under team conference tab, met with team face-to-face to discuss problems,progress, and goals. Formulized individual treatment plan based on medical history, underlying problem and comorbidities. 2. Antithrombotics: -DVT/anticoagulation:SCDs -antiplatelet therapy: N/A 3. Pain Management:Tylenol as needed 4. Mood:Abilify 5 mg daily, Effexor 300 mg daily. Provide emotional support appreciate psychiatry note -antipsychotic agents: Abilify Major depressive disorder 5. Neuropsych: This patientiscapable of making decisions on hisown behalf. 6. Skin/Wound Care:Routine skin checks 7. Fluids/Electrolytes/Nutrition:Routine ins and outs   Poor p.o. intake- Remeron started on 4/3, I 357m on 4/13   8.  Essential hypertension  Cont Lisinopril 20 mg daily,   Maxide 37.5-25 mg daily DC'd on 4/4 due to hyponatremia  Monitor with increased mobility Vitals:   01/22/19 1919 01/23/19 0521  BP: 110/73 120/77  Pulse: 97 98  Resp: 18 18  Temp: 98.3 F (36.8 C) 98.6 F (37 C)  SpO2: 98% 94%   Amlodipine increased to 530mon 3/29  Controlled on 4/15 9. BPH. Flomax 0.4 mg daily, removed  Foley  10. History of GI bleed . Continue Protonix 11. Hyperlipidemia. Resume Lipitor on discharge. 12.  Constipation-bowel meds increased on 3/30, again on 3/31, again on 4/1     13.  Prediabetes  Diet changed to carb modified   SSI ordered on 4/2   CBG (last 3)  Recent Labs    01/22/19 1723 01/22/19 2106 01/23/19 0744   GLUCAP 144* 178* 164*  Controlled, 01/21/2019 14.  Hyponatremia  Sodium 126 on 4/4, 129 on 4/6, asymptomatic  Improved to 131 4/10  Continue to monitor 15.  Sleep disturbance- improved on remeron, ritalin in am to help with am somnolence, increased to 1067mCG on 3/24 reviewed, trazodone 50 started on 3/30, DC'd on 4/3  Remeron started on 4/3  Sleep chart ordered  Improving416.  AKI  Creatinine 0.88 on 4/4  IVF nightly started on 4/3-4/5-echo reviewed, EF stable, BUN normalized 4/6 will DC IV fluids  Encourage fluids  Labs ordered for 4/8 17.  Leukocytosis- WBCs 12.5 on 4/3, 13.8 on 4/6, remains afebrile  Labs ordered 4/8   Continue to monitor  LOS: 19 days A FACE TO FACE EVALUATION WAS PERFORMED  AndCharlett Blake15/2020, 9:31 AM

## 2019-01-23 NOTE — Progress Notes (Signed)
Physical Therapy Session Note  Patient Details  Name: Patrick Pollard MRN: [id number] Date of Birth: July 26, 1946  Today's Date: 01/23/2019 PT Individual Time: 9030-0923 PT Individual Time Calculation (min): 73 min   Short Term Goals: Week 3:  PT Short Term Goal 1 (Week 3): Pt will perform supine<>sit with no more than min assist of 1 PT Short Term Goal 2 (Week 3): Pt will perform bed<>chair transfer using LRAD with no more than min assist of 1 PT Short Term Goal 3 (Week 3): Pt will perform sit<>stand using LRAD with no more than mod assist of 1 PT Short Term Goal 4 (Week 3): Pt will ambulate at least 7ft using LRAD with no more than max assist of 1  Skilled Therapeutic Interventions/Progress Updates:    Pt seated in w/c upon PT arrival, agreeable to therapy tx and denies pain. Pt transported to the gym in w/c. Pt performed lateral scoot to the mat with min assist towards the R. Pt transferred to supine and to R sidelying with mod assist. Pt worked on L LE neuro re-ed this session with use of powder board for gravity eliminated AROM and AAROM- pt performed hip flexion AAROM x 20 and AROM x 10, performed hip extension AROM x 20, performed knee extension AAROM  2 x 10. Pt transferred to supine and performed 2 x 10 bridges this session with R LE more extended to facilitate increased weightbearing through L LE, therapist assisting with L LE positioning in hooklying, cues for techniques. Pt performed PNF D2 Extension pattern with manual resistance from therapist for L LE neuro re-ed x 10. Pt transferred to sitting EOB with mod assist, cues for techniques. Pt performed 2 x 6 partial sit<>stands from mat with emphasis on anterior weightshift and pushing through B LEs, therapist providing manual facilitation. Pt performed sit<>stands x 2 this session from mat with RW and mod-max assist, in standing working on static balance, midline orientation and trying to get L quad activation, mirror for visual feedback.  Performed x 1 sit<>stand with use of L AFO trial, no increased stability or feedback noted with use of brace for knee extension. Therapist performed L UE kinesiotaping this session for subluxation management. Pt performed slideboard to w/c with mod assist and transported back to room. Slideboard to bed with mod assist and transferred to supine mod assist, left with needs in reach and bed alarm set.   Therapy Documentation Precautions:  Precautions Precautions: Fall Precaution Comments: L hemi, SbP <160 Restrictions Weight Bearing Restrictions: No    Therapy/Group: Individual Therapy  Cresenciano Genre, PT, DPT 01/23/2019, 7:59 AM

## 2019-01-24 ENCOUNTER — Inpatient Hospital Stay (HOSPITAL_COMMUNITY): Payer: Self-pay | Admitting: Physical Therapy

## 2019-01-24 ENCOUNTER — Inpatient Hospital Stay (HOSPITAL_COMMUNITY): Payer: Self-pay | Admitting: Speech Pathology

## 2019-01-24 ENCOUNTER — Inpatient Hospital Stay (HOSPITAL_COMMUNITY): Payer: Self-pay | Admitting: Occupational Therapy

## 2019-01-24 LAB — GLUCOSE, CAPILLARY
Glucose-Capillary: 138 mg/dL — ABNORMAL HIGH (ref 70–99)
Glucose-Capillary: 130 mg/dL — ABNORMAL HIGH (ref 70–99)
Glucose-Capillary: 113 mg/dL — ABNORMAL HIGH (ref 70–99)

## 2019-01-24 MED ORDER — MIRTAZAPINE 15 MG PO TABS
15.0000 mg | ORAL_TABLET | Freq: Every day | ORAL | Status: DC
  Administered 2019-01-24 – 2019-02-02 (×10): 15 mg via ORAL
  Filled 2019-01-24 (×10): qty 1

## 2019-01-24 MED ORDER — BETHANECHOL CHLORIDE 10 MG PO TABS
5.0000 mg | ORAL_TABLET | Freq: Three times a day (TID) | ORAL | Status: DC
  Administered 2019-01-24 – 2019-01-28 (×12): 5 mg via ORAL
  Filled 2019-01-24 (×12): qty 1

## 2019-01-24 NOTE — Progress Notes (Signed)
Physical Therapy Session Note  Patient Details  Name: Aquino Galo MRN: [id number] Date of Birth: [birth year]  Today's Date: 01/24/2019 PT Individual Time: 3818-2993 and 7169-6789 PT Individual Time Calculation (min): 30 min and 59 min  Short Term Goals: Week 3:  PT Short Term Goal 1 (Week 3): Pt will perform supine<>sit with no more than min assist of 1 PT Short Term Goal 2 (Week 3): Pt will perform bed<>chair transfer using LRAD with no more than min assist of 1 PT Short Term Goal 3 (Week 3): Pt will perform sit<>stand using LRAD with no more than mod assist of 1 PT Short Term Goal 4 (Week 3): Pt will ambulate at least 43ft using LRAD with no more than max assist of 1  Skilled Therapeutic Interventions/Progress Updates:   Session 1: Pt received sitting in w/c and eager to participate with therapy. Pt reports no pain during session. Orthotist, Trey Paula, present for consultation regarding possible AFO for increased L LE knee extension control during standing tasks and gait. Pt performed sit<>stand from w/c to RW with max assist for lifting/lowering and for L LE knee blocking upon coming to standing. Pt transported in w/c to hallway rail total assist for time management and energy conservation. Pt performed sit<>stand from w/c to R UE support on hallway rail with mod/max assist for lifting. Pt ambulated ~15ft then ~18ft (seated break between) with R UE support on hallway rail, wearing L LE AFO, with max assist for L LE swing phase hip/knee flexion, positioning on initial contact, and knee control during stance phase to prevent buckling as well as for balance - multimodal cuing throughout for upright stance, sequencing of LE stepping, increased R LE step length, and increased L LE glute and quadriceps activation during stance. Patient, therapist, and orthotist discussed L LE AFO and R shoe lift recommendations from orthotist to improve gait mechanics. Pt transported in w/c back to room and left sitting  in w/c with needs in reach and seat belt alarm on.   Session 2: Pt received supine in bed and eager to participate with therapy. Pt reports no pain during session. Pt performed supine>sidelying>sit HOB partially elevated and using bedrails with mod assist for trunk upright - min cuing for sequencing of task. Pt performed R lateral scoot transfer EOB to w/c, no transfer board, with min assist for lifting and pivoting - continues to require cuing for increased anterior trunk weightshift to lift hips from surface to decrease shearing forces - pt education on importance of lifting hips. Pt transported to/from gym in w/c for time management and energy conservation. Pt performed L lateral scoot transfer w/c to EOM, no transfer board, with mod assist for lifting and pivoting - manual facilitation and multimodal cuing for increased anterior weight shifting for improved hip clearance during transfer. Performed repeated sit<>mini-squat position 2 sets of 10 with max assist for lifting - mirror feedback for midline orientation and multimodal cuing for increased L LE gluteal and quadriceps activation to lift bottom from mat. Pt transferred anteriorly from w/c to EOM into tall kneeling position with  BUE support on bench and max assist of 2 for balance, trunk support, and L LE positioning to get on mat - pt tolerated tall kneeling position for ~3-37minutes with mirror feedback and multimodal cuing for increased L gluteal and trunk extensor activation to improve trunk upright and hip extension. Pt performed sit<>stand from EOM to RW with max assist for lifting and blocking L LE knee buckling. Pt performed standing R/L  lateral weight shifts with B UE support on RW and mod assist for standing balance and blocking L LE knee buckling - mirror feedback for improved upright posture and increased lateral shift with multimodal cuing for increased L quadriceps activation for full knee extension in stance. Progressed to pre-gait training  of forward/backwards stepping with R LE focusing on L LE stance phase with multimodal cuing for quadriceps activation and knee control - demonstrates improved quadriceps and gluteal activation with full L lateral weight shift and increased R LE step length. Pt transported in w/c back to room and left sitting in w/c with needs in reach and seat belt alarm on.   Therapy Documentation Precautions:  Precautions Precautions: Fall Precaution Comments: L hemi, SbP <160 Restrictions Weight Bearing Restrictions: No  Therapy/Group: Individual Therapy  Ginny Fortharly M Monterrius Cardosa, PT, DPT 01/24/2019, 7:55 AM

## 2019-01-24 NOTE — Plan of Care (Signed)
  Problem: Consults Goal: RH STROKE PATIENT EDUCATION Description See Patient Education module for education specifics  Outcome: Progressing   Problem: RH BOWEL ELIMINATION Goal: RH STG MANAGE BOWEL W/MEDICATION W/ASSISTANCE Description STG Manage Bowel with Medication with mod I Assistance.  Outcome: Progressing   Problem: RH SAFETY Goal: RH STG ADHERE TO SAFETY PRECAUTIONS W/ASSISTANCE/DEVICE Description STG Adhere to Safety Precautions With supervision Assistance/Device.  Outcome: Progressing   Problem: RH KNOWLEDGE DEFICIT Goal: RH STG INCREASE KNOWLEDGE OF HYPERTENSION Description Patient/caregiver will verbalize understanding of HTN management including medications, monitoring, follow up visits, and diet with min assist.  Outcome: Progressing Goal: RH STG INCREASE KNOWLEGDE OF HYPERLIPIDEMIA Description Patient/caregiver will verbalize understanding of HLD management including medications, monitoring, follow up visits, and diet with min assist.  Outcome: Progressing Goal: RH STG INCREASE KNOWLEDGE OF STROKE PROPHYLAXIS Description Patient/caregiver will verbalize understanding of stroke prophylaxis management including medications, monitoring, follow up visits, and diet with min assist.  Outcome: Progressing   

## 2019-01-24 NOTE — Progress Notes (Signed)
[facility] PHYSICAL MEDICINE & REHABILITATION PROGRESS NOTE   Subjective/Complaints: C/o bladder urgency Still has trouble falling asleep ROS: Denies CP, shortness of breath, nausea, vomiting, diarrhea.  Objective:   No results found. No results for input(s): WBC, HGB, HCT, PLT in the last 72 hours. No results for input(s): NA, K, CL, CO2, GLUCOSE, BUN, CREATININE, CALCIUM in the last 72 hours.  Intake/Output Summary (Last 24 hours) at 01/24/2019 0845 Last data filed at 01/24/2019 0700 Gross per 24 hour  Intake 540 ml  Output 1800 ml  Net -1260 ml     Physical Exam: Vital Signs Blood pressure 122/67, pulse 92, temperature 98.8 F (37.1 C), resp. rate 20, height 6\' 1"  (1.854 m), weight 96.2 kg, SpO2 93 %. Constitutional: NAD.  Vital signs reviewed.  Well-developed. HENT: Normocephalic.  Atraumatic. Eyes: EOMI. No discharge. Cardiovascular: No JVD. Respiratory: Normal effort. GI: BS +. Non-distended. Musc: No edema or tenderness in extremities. Neuro: Alert Dysarthria Motor:  Left upper extremity:handgrip, biceps and triceps  3-/5, Left lower extremity: 1/5 hip flexion, 1/5 distally plantar flexion but no dorsiflexion Sensation absent to LT and proprioception  Skin: Warm and dry.  Intact.  Assessment/Plan: 1. 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  Physiatrist is providing close team supervision and 24 hour management of active medical problems listed below.  Physiatrist and rehab team continue to assess barriers to discharge/monitor patient progress toward functional and medical goals  Care Tool:  Bathing    Body parts bathed by patient: Left arm, Chest, Abdomen, Face, Left upper leg, Right upper leg, Right lower leg, Left lower leg   Body parts bathed by helper: Right arm, Buttocks, Front perineal area Body parts n/a: Left lower leg   Bathing  assist Assist Level: Maximal Assistance - Patient 24 - 49%     Upper Body Dressing/Undressing Upper body dressing   What is the patient wearing?: Pull over shirt    Upper body assist Assist Level: Maximal Assistance - Patient 25 - 49%    Lower Body Dressing/Undressing Lower body dressing      What is the patient wearing?: Pants, Incontinence brief     Lower body assist Assist for lower body dressing: Maximal Assistance - Patient 25 - 49%     Toileting Toileting    Toileting assist Assist for toileting: Maximal Assistance - Patient 25 - 49%     Transfers Chair/bed transfer  Transfers assist  Chair/bed transfer activity did not occur: Safety/medical concerns  Chair/bed transfer assist level: Moderate Assistance - Patient 50 - 74%     Locomotion Ambulation   Ambulation assist   Ambulation activity did not occur: Safety/medical concerns  Assist level: 2 helpers Assistive device: Hand held assist Max distance: 35'   Walk 10 feet activity   Assist  Walk 10 feet activity did not occur: Safety/medical concerns  Assist level: 2 helpers Assistive device: Other (comment)(Rail)   Walk 50 feet activity   Assist Walk 50 feet with 2 turns activity did not occur: Safety/medical concerns         Walk 150 feet activity   Assist Walk 150 feet activity did not occur: Safety/medical concerns         Walk 10 feet on uneven surface  activity   Assist Walk 10 feet on uneven surfaces activity did not occur: Safety/medical concerns         Wheelchair  Assist Will patient use wheelchair at discharge?: Yes Type of Wheelchair: Manual Wheelchair activity did not occur: Safety/medical concerns  Wheelchair assist level: Minimal Assistance - Patient > 75% Max wheelchair distance: 60 ft    Wheelchair 50 feet with 2 turns activity    Assist    Wheelchair 50 feet with 2 turns activity did not occur: Safety/medical concerns   Assist Level:  Minimal Assistance - Patient > 75%   Wheelchair 150 feet activity     Assist Wheelchair 150 feet activity did not occur: Safety/medical concerns   Assist Level: Minimal Assistance - Patient > 75%    Medical Problem List and Plan: 1.Left side weakness with facial droopas well as left hemi-sensory losssecondary to right thalamic hemorrhage with IVH secondary to hypertensive crisis  Continue CIR, PT, OT, SLP  2. Antithrombotics: -DVT/anticoagulation:SCDs -antiplatelet therapy: N/A 3. Pain Management:Tylenol as needed 4. Mood:Abilify 5 mg daily, Effexor 300 mg daily. Provide emotional support appreciate psychiatry note -antipsychotic agents: Abilify Major depressive disorder 5. Neuropsych: This patientiscapable of making decisions on hisown behalf. 6. Skin/Wound Care:Routine skin checks 7. Fluids/Electrolytes/Nutrition:Routine ins and outs   Poor p.o. intake- Remeron started on 4/3, I 360ml on 4/13   8.  Essential hypertension  Cont Lisinopril 20 mg daily,   Maxide 37.5-25 mg daily DC'd on 4/4 due to hyponatremia  Monitor with increased mobility Vitals:   01/23/19 1938 01/24/19 0607  BP: 128/80 122/67  Pulse: 91 92  Resp: 19 20  Temp: 98 F (36.7 C) 98.8 F (37.1 C)  SpO2: 98% 93%   Amlodipine increased to 5mg  on 3/29  Controlled on 4/15 9. BPH. Flomax 0.4 mg daily, removed  Foley  10. History of GI bleed . Continue Protonix 11. Hyperlipidemia. Resume Lipitor on discharge. 12.  Constipation-bowel meds increased on 3/30, again on 3/31, again on 4/1     13.  Prediabetes  Diet changed to carb modified   SSI ordered on 4/2   CBG (last 3)  Recent Labs    01/23/19 1656 01/23/19 2156 01/24/19 0608  GLUCAP 135* 154* 138*  Controlled, 01/21/2019 14.  Hyponatremia  Sodium 126 on 4/4, 129 on 4/6, asymptomatic  Improved to 131 4/10  Continue to monitor 15.  Sleep disturbance- 7.5 mg  remeron will increase to 15mg   ,  ritalin 10mg   in am to help with am somnolence,  ECG on 3/24 reviewed, trazodone 50 started on 3/30, DC'd on 4/3  Remeron started on 4/3  Sleep chart ordered  Improving416.  AKI  Creatinine 0.88 on 4/4  IVF nightly started on 4/3-4/5-echo reviewed, EF stable, BUN normalized 4/6 will DC IV fluids  Encourage fluids  Labs ordered for 4/8 17.  Leukocytosis- WBCs 12.5 on 4/3, 13.8 on 4/6, remains afebrile  Labs ordered 4/8   Continue to monitor 18.  Bladder issues - urgency likely multifactorial  CVA Meds , prostate, will reduce urecholine, monitor for retention LOS: 20 days A FACE TO FACE EVALUATION WAS PERFORMED  Patrick Pollard E Dequarius Jeffries 01/24/2019, 8:45 AM

## 2019-01-24 NOTE — Progress Notes (Signed)
Speech Language Pathology Weekly Progress and Session Note  Patient Details  Name: Patrick Pollard MRN: [id number] Date of Birth: 06/25/1946  Beginning of progress report period: January 18, 2019 End of progress report period: January 24, 2019  Today's Date: 01/24/2019 SLP Individual Time: 1130-1200 SLP Individual Time Calculation (min): 30 min  Short Term Goals: Week 3: SLP Short Term Goal 1 (Week 3): Pt will complete semi-complex money and medication management tasks with supervision cues.  SLP Short Term Goal 1 - Progress (Week 3): Met SLP Short Term Goal 2 (Week 3): Pt will complete semi-complex reasoning tasks with Min A cues.  SLP Short Term Goal 2 - Progress (Week 3): Met SLP Short Term Goal 3 (Week 3): Pt will demonstrate selective attention in moderately distracting environment for ~ 30 minutes with supervision cues.  SLP Short Term Goal 3 - Progress (Week 3): Met SLP Short Term Goal 4 (Week 3): Pt will improve recall of complex information after lapsed time in 8 out fo 10 opportunities with supervision cues.  SLP Short Term Goal 4 - Progress (Week 3): Met    New Short Term Goals: Week 4: SLP Short Term Goal 1 (Week 4): STGs=LTGs d/t remaining length of stay  Weekly Progress Updates:  Pt has made good progress this reporting period and as a result he met 4 out of 4 STGs. Pt with progress towards complex problem solving, attention and memory. Skilled ST is required to further target complex cognitive functions, increase pt's functional independence and reduce caregiver burden. Continue to anticipate that pt will require 24 hour supervision at discharge.      Intensity: Minumum of 1-2 x/day, 30 to 90 minutes Frequency: 3 to 5 out of 7 days Duration/Length of Stay: 4/24 Treatment/Interventions: Cognitive remediation/compensation;Medication managment;Functional tasks;Patient/family education;Therapeutic Activities   Daily Session  Skilled Therapeutic Interventions: Skilled  treatment session focused on cognition goals. SLP facilitated session by providing supervision cues to aid in navigating ipad to locate several applications that pt was interested in finding. Pt left upright in wheelchair, all needs within reach. Continue per current plan of care.      General    Pain Pain Assessment Pain Scale: 0-10 Pain Score: 0-No pain  Therapy/Group: Individual Therapy  Patrick Pollard 01/24/2019, 12:36 PM

## 2019-01-24 NOTE — Progress Notes (Signed)
Occupational Therapy Session Note  Patient Details  Name: Patrick Pollard MRN: [id number] Date of Birth: [birth year]  Today's Date: 01/24/2019 OT Individual Time: 8309-4076 OT Individual Time Calculation (min): 74 min    Short Term Goals: Week 3:  OT Short Term Goal 1 (Week 3): Pt will donn pants sit to stand with max assist during session. OT Short Term Goal 2 (Week 3): Pt will donn pullover shirt with min assist in supported sitting.  OT Short Term Goal 3 (Week 3): Pt will complete toilet transfer squat pivot with mod assist to both the left and right.  OT Short Term Goal 4 (Week 3): Pt will use the LUE as a gross assist with selfcare tasks with mod facilitation during bathing tasks. OT Short Term Goal 5 (Week 3): Pt will perform LB bathing with max assist.   Skilled Therapeutic Interventions/Progress Updates:    Pt completed shower and dressing during session.  Mod assist needed for supine to sit EOB with mod assist complete for sit to stand in the Stedy from the EOB.  He was then rolled into the shower where he completed stand to sit with mod assist as well.  Mod instructional cueing needed for bathing secondary to decreased selective attention.  Max hand over hand assistance for washing the right arm and shoulder using the LUE.  He is able to hold the soap bottle in his left hand with supervision, but needs mod assist for pouring it onto the washcloth.  He was able to stand with max assist holding onto the grab bar on the right side while therapist assisted with washing his buttocks.  Dressing was completed sit to stand at the sink as well.  Max assist for donning brief and pants sit to stand with mod assist for donning pullover shirt and for application of deodorant.  Therapist assisted with donning TEDs and shoes to complete session.  Pt left in the wheelchair with call button and phone in reach and safety alarm belt in place.    Therapy Documentation Precautions:   Precautions Precautions: Fall Precaution Comments: L hemi, SbP <160 Restrictions Weight Bearing Restrictions: No    Pain: Pain Assessment Pain Scale: Faces Pain Score: 0-No pain ADL: See Care Tool Section for some details of ADL tasks.  Therapy/Group: Individual Therapy  Llana Deshazo OTR/L 01/24/2019, 11:50 AM

## 2019-01-25 ENCOUNTER — Inpatient Hospital Stay (HOSPITAL_COMMUNITY): Payer: Self-pay | Admitting: Occupational Therapy

## 2019-01-25 ENCOUNTER — Inpatient Hospital Stay (HOSPITAL_COMMUNITY): Payer: Self-pay | Admitting: Speech Pathology

## 2019-01-25 ENCOUNTER — Inpatient Hospital Stay (HOSPITAL_COMMUNITY): Payer: Self-pay | Admitting: Physical Therapy

## 2019-01-25 LAB — GLUCOSE, CAPILLARY
Glucose-Capillary: 152 mg/dL — ABNORMAL HIGH (ref 70–99)
Glucose-Capillary: 121 mg/dL — ABNORMAL HIGH (ref 70–99)
Glucose-Capillary: 126 mg/dL — ABNORMAL HIGH (ref 70–99)
Glucose-Capillary: 159 mg/dL — ABNORMAL HIGH (ref 70–99)

## 2019-01-25 NOTE — Patient Care Conference (Addendum)
Inpatient RehabilitationTeam Conference and Plan of Care Update Date: 01/23/2019   Time: 11:00 AM    Patient Name: Patrick LevyBenjamin Sancho      Medical Record Number: [id number]  Date of Birth: 1946-06-14 Sex: Male         Room/Bed: 4W19C/4W19C-01 Payor Info: Payor: [employer] MEDICARE / [facility]: [facility] / Product Type: *No Product type* /    Admitting Diagnosis: ICH  Admit Date/Time:  01/04/2019  3:36 PM Admission Comments: No comment available   Primary Diagnosis:  ICH (intracerebral hemorrhage) (HCC) Principal Problem: ICH (intracerebral hemorrhage) (HCC)  Patient Active Problem List   Diagnosis Date Noted  . 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 Leukocytosis   . MDD (major depressive disorder), single episode, moderate (HCC)   . AKI (acute kidney injury) (HCC)   . Colonic mass   . Severe episode of recurrent major depressive disorder, without psychotic features (HCC)   . Sleep disturbance   . Hyponatremia   . Prediabetes   . Slow transit constipation   . Essential hypertension   . Labile blood pressure   . Hypertensive emergency 01/04/2019  . Hyperlipemia 01/04/2019  . BPH (benign prostatic hyperplasia) 01/04/2019  . Thyroid mass, left 01/04/2019  . Left-sided intracerebral hemorrhage (HCC) 01/04/2019  . 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 Chronic post-traumatic stress disorder (PTSD) 12/25/2018  . MDD (major depressive disorder), recurrent episode, moderate (HCC) 12/04/2018  . GIB (gastrointestinal bleeding) 12/31/2017    Expected Discharge Date: Expected Discharge Date: 02/01/19  Team Members Present: Physician leading conference: Dr. Claudette LawsAndrew Kirsteins Social Worker Present: Staci AcostaJenny Jax Kentner, LCSW Nurse Present: Other (comment)(Jamie Freida BusmanAllen, RN) PT Present: Woodfin GanjaEmily Van Shagen, PT OT Present: Perrin MalteseJames McGuire, OT SLP Present: Reuel DerbyHappi Overton, SLP PPS  Coordinator present : Fae PippinMelissa Bowie     Current Status/Progress Goal Weekly Team Focus  Medical   not retaining urine, poor sleep, less somnolent in am     Still with intermittent sleep problems   Bowel/Bladder   pt continent of b/b with urgency leading to occasional incontinence; LBM 04/13; PVR post void, no need to I/O cath   remain continent of b/b; gain regualr bowel pattern  assist with tolieting needs prn   Swallow/Nutrition/ Hydration             ADL's   Supervision for static sitting balance, with min to mod for dynamic depending on the task.  He still demonstrates increased left bias in sitting and standing secondary to pushing, but it has been reduced.  He is able to complete UB bathing at min assist with mod assist for UB dressing.  Max assist for LB bathing and dressing sit to stand.  Max assist for stand pivot transfers to the bed and toilet.  Still with left shoulder subluxation.  brunnstrum state III in the arm and stage V in the hand.  min to mod assist overall  neuromuscular re-education, balance retraining, selfcare retraining, neuromuscular re-education,  pt/family education,   Mobility   mod assist bed mobility, min-mod assist for level slideboard transfers, min assist for w/c propulsion, gait 15 ft at rail max assist +2  min assist bed mobility, mod assist transfers, mod assist ambulating with LRAD  standing balance, gait, transfers, L LE neuro re-ed, education, d/c planning   Communication             Safety/Cognition/ Behavioral Observations  Min-Supervision A  Supervision A  semi-complex  to complex problem solving, selective attention, complex recall    Pain   c/o generalized pain once in a while; prn tylenol as relief  pain level <2/10  assess pain q shift and prn    Skin   bilateral ecchymosis to hands; MASD to groin  remain free of new skin breakdown/infection   assess skin q shift and prn    Rehab Goals Patient on target to meet rehab goals: Yes Rehab Goals  Revised: none *See Care Plan and progress notes for long and short-term goals.     Barriers to Discharge  Current Status/Progress Possible Resolutions Date Resolved   Physician    Medical stability     progressing better this week  work on quad strength and hip flexor activation      Nursing                  PT                    OT                  SLP                SW                Discharge Planning/Teaching Needs:  Pt to return to his home where his wife and dtr will provide necessary care.  Pt's dtr to come for family education closer to d/c.   Team Discussion:  Pt's blood pressure is okay and medically he is doing well.  Sometimes he is verbose and it impacts his therapy.  Ritalin seems to be helping and pt was sleeping well, but had a bad night last night per nursing.  Pt is doing better with voiding and is not needing in/out caths anymore.  Pt can be incontinent of bladder so nursing to try times toileting.  Pt's sitting balance is better, but in dynamic sitting he still pushes to the left.  He is doing UB tasks with min A and LB max A.  He cn sit to stand max A and slideboard is min A.  Pt is max A for stand or squat pivot txs.  He is getting more feeling in his left hand, more than the rest of his arm.  Pt can get distracted, but his mood is better.  Pt need min A for med mobility with rails if he gets out on the left.  He is min A tx to level surfaces.  Pt can walk 15' at rail with mod A and is getting some hip flexion.  Pt is min A for complex tasks with ST and has S level goals at d/c.  Revisions to Treatment Plan:  none    Continued Need for Acute Rehabilitation Level of Care: The patient requires daily medical management by a physician with specialized training in physical medicine and rehabilitation for the following conditions: Daily direction of a multidisciplinary physical rehabilitation program to ensure safe treatment while eliciting the highest outcome that is of  practical value to the patient.: Yes Daily medical management of patient stability for increased activity during participation in an intensive rehabilitation regime.: Yes Daily analysis of laboratory values and/or radiology reports with any subsequent need for medication adjustment of medical intervention for : Neurological problems;Blood pressure problems   I attest that I was present, lead the team conference, and concur with the assessment and plan of the team.Team conference was held via  web/ teleconference due to COVID - 19.  Elvera Lennox 01/25/2019, 1:55 PM

## 2019-01-25 NOTE — Plan of Care (Signed)
  Problem: Consults Goal: RH STROKE PATIENT EDUCATION Description See Patient Education module for education specifics  Outcome: Progressing   Problem: RH BOWEL ELIMINATION Goal: RH STG MANAGE BOWEL W/MEDICATION W/ASSISTANCE Description STG Manage Bowel with Medication with mod I Assistance.  Outcome: Progressing   Problem: RH SAFETY Goal: RH STG ADHERE TO SAFETY PRECAUTIONS W/ASSISTANCE/DEVICE Description STG Adhere to Safety Precautions With supervision Assistance/Device.  Outcome: Progressing   Problem: RH KNOWLEDGE DEFICIT Goal: RH STG INCREASE KNOWLEDGE OF HYPERTENSION Description Patient/caregiver will verbalize understanding of HTN management including medications, monitoring, follow up visits, and diet with min assist.  Outcome: Progressing Goal: RH STG INCREASE KNOWLEGDE OF HYPERLIPIDEMIA Description Patient/caregiver will verbalize understanding of HLD management including medications, monitoring, follow up visits, and diet with min assist.  Outcome: Progressing Goal: RH STG INCREASE KNOWLEDGE OF STROKE PROPHYLAXIS Description Patient/caregiver will verbalize understanding of stroke prophylaxis management including medications, monitoring, follow up visits, and diet with min assist.  Outcome: Progressing   

## 2019-01-25 NOTE — Progress Notes (Signed)
Speech Language Pathology Daily Session Note  Patient Details  Name: Montell Winget MRN: [id number] Date of Birth: [birth year]  Today's Date: 01/25/2019 SLP Individual Time: 1430-1500 SLP Individual Time Calculation (min): 30 min  Short Term Goals: Week 4: SLP Short Term Goal 1 (Week 4): STGs=LTGs d/t remaining length of stay  Skilled Therapeutic Interventions:  Skilled treatment session focused on cognition goals. SLP facilitated session by providing supervision cues for use of compensatory memory aid to recall previous list of therapies. After recalling therapy schedule, pt able to recall specific tasks within each therapy with Mod I ability. Pt also demonstrated greater insight into deficits, POC and recovery process than previously noted. Pt with great progress and very encouraged. Pt left upright in wheelchair with all needs within reach. Continue per current plan of care.      Pain Pain Assessment Pain Scale: 0-10 Pain Score: 0-No pain  Therapy/Group: Individual Therapy  Shaindy Reader 01/25/2019, 3:39 PM

## 2019-01-25 NOTE — Progress Notes (Signed)
Occupational Therapy Weekly Progress Note  Patient Details  Name: Patrick Pollard MRN: [id number] Date of Birth: 10-06-1946  Beginning of progress report period: January 12, 2019 End of progress report period: January 18, 2019  Today's Date: 01/25/2019 OT Individual Time: 8786-7672 OT Individual Time Calculation (min): 60 min    Patient has met 3 of 5 short term goals.  Mr. Farace is continuing to make steady progress with OT at this time.  He is still exhibiting some pusher tendencies to the right in sitting and with standing and transitional movements, however the severity of it has improved.  He is able to maintain static sitting balance with supervision overall, but still needs mod assist for dynamic sitting balance with UB bathing and max assist with UB dressing.  LB bathing and dressing is currently at a max assist level sit to stand as well.  He continues to need max hand over hand for integration of the LUE as an active assist for bathing tasks in order to wash the right arm.  He can use the LUE for stabilizing objects such as the soap for removing the top with min assist.  Digit flexion is at 90% of normal with extension at 75%.  Shoulder and arm movement are present but only at a Brunnstrum stage III level at this time.  Transfers stand pivot are max assist as well as squat pivot transfers to the pushing right side.  He can complete squat pivot transfers to the left with min to mod assist.  Attention level is still impaired with him needing mod to max instructional cueing to demonstrate selective attention to tasks while engaged in conversation.  Head turn to the right is still present but not as severe and he will turn his head to the left with conversation or when looking for stated items.  Recommend continued OT with anticipated discharge of 4/24.  Will have family in next week for beginning of education.    Patient continues to demonstrate the following deficits: muscle weakness, impaired  timing and sequencing, unbalanced muscle activation and decreased coordination, decreased midline orientation, decreased attention to left and left side neglect, decreased attention, decreased awareness, decreased problem solving and decreased memory and decreased sitting balance, decreased standing balance, decreased postural control, hemiplegia and decreased balance strategies and therefore will continue to benefit from skilled OT intervention to enhance overall performance with BADL and Reduce care partner burden.  Patient progressing toward long term goals..  Continue plan of care.  OT Short Term Goals Week 4:  OT Short Term Goal 1 (Week 4): Continue working on established LTGs set at min to mod assist for discharge.   Skilled Therapeutic Interventions/Progress Updates:    Pt worked on donning pants to start session.  Max assist for completion of this sit to stand.  Therapist then applied NMES to the left shoulder to help with reduction of subluxation and re-education.  Pads applied to the supraspinatus and middle deltoid.  He was able to tolerate 20 mins of active stimulation without any adverse reactions.  On/off time was set at 10 seconds with intensity at level 35 with PPS at 35 and pulse width at 300.  Also educated pt on AAROM exercises for the LUE at the shoulder and elbow to be completed over the weekend and when he is not in therapy.  He was able to return demonstrate safe completion.  Finished session with stand pivot transfer from wheelchair back to the bed with max assist.  Mod assist  for transition from sitting to supine.  Pt left with bed alarm in place and call button and phone in reach and LUE positioned on pillows.    Therapy Documentation Precautions:  Precautions Precautions: Fall Precaution Comments: left shoulder subluxation Restrictions Weight Bearing Restrictions: No  Pain: Pain Assessment Pain Scale: 0-10 Pain Score: 0-No pain ADL: See Care Tool Section for some  details of ADL  Therapy/Group: Individual Therapy  Jesson Foskey OTR/L 01/25/2019, 12:17 PM

## 2019-01-25 NOTE — Progress Notes (Signed)
[facility] PHYSICAL MEDICINE & REHABILITATION PROGRESS NOTE   Subjective/Complaints: Still with bladder urgency , keeps urinal close by ROS: Denies CP, shortness of breath, nausea, vomiting, diarrhea.  Objective:   No results found. No results for input(s): WBC, HGB, HCT, PLT in the last 72 hours. No results for input(s): NA, K, CL, CO2, GLUCOSE, BUN, CREATININE, CALCIUM in the last 72 hours.  Intake/Output Summary (Last 24 hours) at 01/25/2019 0853 Last data filed at 01/25/2019 0847 Gross per 24 hour  Intake 480 ml  Output 500 ml  Net -20 ml     Physical Exam: Vital Signs Blood pressure 110/78, pulse (!) 103, temperature 98.4 F (36.9 C), temperature source Oral, resp. rate 16, height 6\' 1"  (1.854 m), weight 96.2 kg, SpO2 93 %. Constitutional: NAD.  Vital signs reviewed.  Well-developed. HENT: Normocephalic.  Atraumatic. Eyes: EOMI. No discharge. Cardiovascular: No JVD. Respiratory: Normal effort. GI: BS +. Non-distended. Musc: No edema or tenderness in extremities. Neuro: Alert Dysarthria Motor:  Left upper extremity:handgrip, biceps and triceps  3-/5, Left lower extremity: 1/5 hip flexion, 1/5 distally plantar flexion but no dorsiflexion Sensation absent to LT and proprioception  Skin: Warm and dry.  Intact.  Assessment/Plan: 1. 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  Physiatrist is providing close team supervision and 24 hour management of active medical problems listed below.  Physiatrist and rehab team continue to assess barriers to discharge/monitor patient progress toward functional and medical goals  Care Tool:  Bathing    Body parts bathed by patient: Left arm, Chest, Abdomen, Face, Left upper leg, Right upper leg, Right lower leg, Left lower leg   Body parts bathed by helper: Right arm, Buttocks, Front perineal area Body parts n/a: Left  lower leg   Bathing assist Assist Level: Maximal Assistance - Patient 24 - 49%     Upper Body Dressing/Undressing Upper body dressing   What is the patient wearing?: Pull over shirt    Upper body assist Assist Level: Maximal Assistance - Patient 25 - 49%    Lower Body Dressing/Undressing Lower body dressing      What is the patient wearing?: Pants, Incontinence brief     Lower body assist Assist for lower body dressing: Maximal Assistance - Patient 25 - 49%     Toileting Toileting    Toileting assist Assist for toileting: Maximal Assistance - Patient 25 - 49%     Transfers Chair/bed transfer  Transfers assist  Chair/bed transfer activity did not occur: Safety/medical concerns  Chair/bed transfer assist level: Moderate Assistance - Patient 50 - 74%     Locomotion Ambulation   Ambulation assist   Ambulation activity did not occur: Safety/medical concerns  Assist level: 2 helpers Assistive device: Other (comment)(hallway rail) Max distance: 31ft   Walk 10 feet activity   Assist  Walk 10 feet activity did not occur: Safety/medical concerns  Assist level: 2 helpers Assistive device: Other (comment)(hallway rail)   Walk 50 feet activity   Assist Walk 50 feet with 2 turns activity did not occur: Safety/medical concerns         Walk 150 feet activity   Assist Walk 150 feet activity did not occur: Safety/medical concerns         Walk 10 feet on uneven surface  activity   Assist Walk 10 feet on uneven surfaces activity did not occur: Safety/medical concerns  Wheelchair     Assist Will patient use wheelchair at discharge?: Yes Type of Wheelchair: Manual Wheelchair activity did not occur: Safety/medical concerns  Wheelchair assist level: Minimal Assistance - Patient > 75% Max wheelchair distance: 60 ft    Wheelchair 50 feet with 2 turns activity    Assist    Wheelchair 50 feet with 2 turns activity did not occur:  Safety/medical concerns   Assist Level: Minimal Assistance - Patient > 75%   Wheelchair 150 feet activity     Assist Wheelchair 150 feet activity did not occur: Safety/medical concerns   Assist Level: Minimal Assistance - Patient > 75%    Medical Problem List and Plan: 1.Left side weakness with facial droopas well as left hemi-sensory losssecondary to right thalamic hemorrhage with IVH secondary to hypertensive crisis  Continue CIR, PT, OT, SLP  2. Antithrombotics: -DVT/anticoagulation:SCDs -antiplatelet therapy: N/A 3. Pain Management:Tylenol as needed 4. Mood:Abilify 5 mg daily, Effexor 300 mg daily. Provide emotional support appreciate psychiatry note -antipsychotic agents: Abilify Major depressive disorder 5. Neuropsych: This patientiscapable of making decisions on hisown behalf. 6. Skin/Wound Care:Routine skin checks 7. Fluids/Electrolytes/Nutrition:Routine ins and outs   Poor p.o. intake- Remeron started on 4/3, I 360ml on 4/13   8.  Essential hypertension  Cont Lisinopril 20 mg daily,   Maxide 37.5-25 mg daily DC'd on 4/4 due to hyponatremia  Monitor with increased mobility Vitals:   01/24/19 0607 01/24/19 1509  BP: 122/67 110/78  Pulse: 92 (!) 103  Resp: 20 16  Temp: 98.8 F (37.1 C) 98.4 F (36.9 C)  SpO2: 93% 93%   Amlodipine increased to 5mg  on 3/29  Controlled on 4/16 9. BPH. Flomax 0.4 mg daily, removed  Foley  10. History of GI bleed . Continue Protonix 11. Hyperlipidemia. Resume Lipitor on discharge. 12.  Constipation-bowel meds increased on 3/30, again on 3/31, again on 4/1     13.  Prediabetes  Diet changed to carb modified   SSI ordered on 4/2   CBG (last 3)  Recent Labs    01/24/19 1217 01/24/19 1748 01/25/19 0635  GLUCAP 130* 113* 152*  Controlled, 01/25/2019- don't think pt will need SSI  on D/C 14.  Hyponatremia  Sodium 126 on 4/4, 129 on 4/6, asymptomatic  Improved to 131  4/10  Continue to monitor 15.  Sleep disturbance- 7.5 mg  remeron will increase to 15mg   , ritalin 10mg   in am to help with am somnolence,  ECG on 3/24 reviewed, trazodone 50 started on 3/30, DC'd on 4/3  Remeron started on 4/3  Sleep chart ordered  Improving416.  AKI  Creatinine 0.88 on 4/4  IVF nightly started on 4/3-4/5-echo reviewed, EF stable, BUN normalized 4/6 will DC IV fluids  Encourage fluids  Labs ordered for 4/8 17.  Leukocytosis- WBCs 12.5 on 4/3, 13.8 on 4/6, remains afebrile  Labs ordered 4/8   Continue to monitor 18.  Bladder issues - urgency likely multifactorial  CVA Meds , prostate, will d/c urecholine, monitor for retention LOS: 21 days A FACE TO FACE EVALUATION WAS PERFORMED  Erick Colacendrew E  01/25/2019, 8:53 AM

## 2019-01-25 NOTE — Progress Notes (Signed)
Physical Therapy Session Note  Patient Details  Name: Patrick Pollard MRN: [id number] Date of Birth: [birth year]  Today's Date: 01/25/2019 PT Individual Time: 4098-1191 PT Individual Time Calculation (min): 58 min   Short Term Goals: Week 3:  PT Short Term Goal 1 (Week 3): Pt will perform supine<>sit with no more than min assist of 1 PT Short Term Goal 2 (Week 3): Pt will perform bed<>chair transfer using LRAD with no more than min assist of 1 PT Short Term Goal 3 (Week 3): Pt will perform sit<>stand using LRAD with no more than mod assist of 1 PT Short Term Goal 4 (Week 3): Pt will ambulate at least 24ft using LRAD with no more than max assist of 1  Skilled Therapeutic Interventions/Progress Updates:   Pt received supine in bed and eager to participate with therapy. Pt performed supine>sidelying>sit with HOB partially elevated and using bed rails with mod assist for trunk upright - min cuing for use of R LE to assist L LE EOB. Pt performed R lateral scoot transfer EOB to w/c, no transfer board, with min assist for lifting and pivoting hips with cuing for increased anterior weight shift to improved hip clearance. Pt transported to dayroom in w/c. Pt performed R lateral scoot transfer w/c to EOM, no transfer board, with min assist for lifting and pivoting hips - manual facilitation and cuing for increased anterior weight shifting. Pt performed R and L lateral scoot transfer EOM focusing on anterior trunk lean for improved hip clearance - pt demonstrates increased ease scooting L compared to R with pt noted to have hesitancy when leaning trunk anterior and to the L when trying to scoot R due to fear of LOB. Pt performed L lateral scoot transfer EOM to w/c with min assist for lifting and pivoting hips. Pt transported in w/c to hallway rail. Pt performed sit<>stand at hallway rail with max assist for lifting and blocking L LE knee buckling. Pt ambulated ~57ft x 2 at hallway rail with R UE support and  wearing L LE AFO to aid in knee extension - pt requires max assist for L LE swing phase for hip and knee flexion, L LE placement on initial contact, and L knee blocking during stance as well as balance at trunk - max multimodal cuing for sequencing of B LE stepping and L LE stance phase as well as upright posture. Pt transported in w/c to parallel bars. Pt performed sit<>stand from w/c to parallel bars with mod assist for lifting and L LE knee block. Pt ambulated ~12ft x3 in parallel bars with B UE support (L UE intermittent grasp on bar) and wearing L LE AFO - max assist for L LE swing for hip/knee flexion, placement on initial contact, and stance for knee extension - pt demonstrating improvement in trunk upright when ambulating in the parallel bars. Pt returned to room in w/c and left sitting in w/c with needs in reach and seat belt alarm on.   Therapy Documentation Precautions:  Precautions Precautions: Fall Precaution Comments: left shoulder subluxation Restrictions Weight Bearing Restrictions: No  Pain: Pt reports he has been having intermittent L shoulder pain, no additional information provided. UE supported during functional mobility tasks for pain management.    Therapy/Group: Individual Therapy  Ginny Forth, PT, DPT 01/25/2019, 4:15 PM

## 2019-01-25 NOTE — Progress Notes (Signed)
Social Work Patient ID: Patrick Pollard, male   DOB: 1946/08/19, 73 y.o.   MRN: [id number]   CSW met with pt and then talked with pt's dtr and wife 01-24-19 via telephone to update them on team conference discussion.  They were pleased to hear that pt is progressing and would be okay if team decided to extend his stay if it means that pt would make more progress.  Dtr plans to come for family education on 01-30-19 unless d/c date is changed.  CSW will continue to follow and assist with arranging family education and assist as needed.

## 2019-01-25 NOTE — Progress Notes (Signed)
Occupational Therapy Session Note  Patient Details  Name: Patrick Pollard MRN: [id number] Date of Birth: 02/16/1946  Today's Date: 01/25/2019 OT Individual Time: 2952-8413 OT Individual Time Calculation (min): 60 min    Short Term Goals: Week 3:  OT Short Term Goal 1 (Week 3): Pt will donn pants sit to stand with max assist during session. OT Short Term Goal 2 (Week 3): Pt will donn pullover shirt with min assist in supported sitting.  OT Short Term Goal 3 (Week 3): Pt will complete toilet transfer squat pivot with mod assist to both the left and right.  OT Short Term Goal 4 (Week 3): Pt will use the LUE as a gross assist with selfcare tasks with mod facilitation during bathing tasks. OT Short Term Goal 5 (Week 3): Pt will perform LB bathing with max assist.   Skilled Therapeutic Interventions/Progress Updates:    Patient in bed and ready for therapy .  Bed mobility with min A and moderate cues for technique.  Sit pivot transfer bed to w/c with min a and tactile cues/guarding of left LE.  Oral care - set up/supervision.  Patient completed sponge bath at the sink with assistance for right UE, min A to maintain crossed legs for LB washing.  Completed washing of peri area and buttocks in stance ( min A to maintain standing at the bed rail, dependent for washing).  Patient is dependent to don incontinence brief in standing position.  Patient able to don slipper socks with min A with legs crossed.  No pants available at this time. Completed left UE AAROM and dexterity activity with moderate difficulty.   Standing / reaching activity with min A.  Patient remained in w/c with seat belt alarm set and tray table in reach at close of session.  Therapy Documentation Precautions:  Precautions Precautions: Fall Precaution Comments: left shoulder subluxation Restrictions Weight Bearing Restrictions: No General:   Vital Signs:  Pain: Pain Assessment Pain Scale: 0-10 Pain Score: 0-No pain    Other Treatments:     Therapy/Group: Individual Therapy  Barrie Lyme 01/25/2019, 12:16 PM

## 2019-01-26 LAB — GLUCOSE, CAPILLARY
Glucose-Capillary: 145 mg/dL — ABNORMAL HIGH (ref 70–99)
Glucose-Capillary: 109 mg/dL — ABNORMAL HIGH (ref 70–99)
Glucose-Capillary: 173 mg/dL — ABNORMAL HIGH (ref 70–99)
Glucose-Capillary: 121 mg/dL — ABNORMAL HIGH (ref 70–99)

## 2019-01-26 NOTE — Progress Notes (Signed)
PHYSICAL MEDICINE & REHABILITATION PROGRESS NOTE   Subjective/Complaints: Has numerous questions about follow up equipment and discharge plan  ROS: Patient denies fever, rash, sore throat, blurred vision, nausea, vomiting, diarrhea, cough, shortness of breath or chest pain, joint or back pain, headache, or mood change.    Objective:   No results found. No results for input(s): WBC, HGB, HCT, PLT in the last 72 hours. No results for input(s): NA, K, CL, CO2, GLUCOSE, BUN, CREATININE, CALCIUM in the last 72 hours.  Intake/Output Summary (Last 24 hours) at 01/26/2019 1056 Last data filed at 01/26/2019 1051 Gross per 24 hour  Intake 720 ml  Output 1750 ml  Net -1030 ml     Physical Exam: Vital Signs Blood pressure 130/88, pulse 90, temperature (!) 97.4 F (36.3 C), temperature source Oral, resp. rate 16, height 6\' 1"  (1.854 m), weight 96.2 kg, SpO2 97 %. Constitutional: No distress . Vital signs reviewed. HEENT: EOMI, oral membranes moist Neck: supple Cardiovascular: RRR without murmur. No JVD    Respiratory: CTA Bilaterally without wheezes or rales. Normal effort    GI: BS +, non-tender, non-distended  Musc: No edema or tenderness in extremities. Neuro: Alert Dysarthria Motor:  Left upper extremity:handgrip, biceps and triceps  3-/5, Left lower extremity: 1/5 hip flexion, 1/5 distally plantar flexion but with little to no depression Sensation absent to LT and proprioception  Skin:intact  Assessment/Plan: 1. 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  Physiatrist is providing close team supervision and 24 hour management of active medical problems listed below.  Physiatrist and rehab team continue to assess barriers to discharge/monitor patient progress toward functional and medical goals  Care Tool:  Bathing    Body parts bathed by patient: Left  arm, Chest, Abdomen, Face, Left upper leg, Right upper leg, Right lower leg, Left lower leg   Body parts bathed by helper: Right arm, Buttocks, Front perineal area Body parts n/a: Left lower leg   Bathing assist Assist Level: Moderate Assistance - Patient 50 - 74%     Upper Body Dressing/Undressing Upper body dressing   What is the patient wearing?: Pull over shirt    Upper body assist Assist Level: Moderate Assistance - Patient 50 - 74%    Lower Body Dressing/Undressing Lower body dressing      What is the patient wearing?: Pants, Incontinence brief     Lower body assist Assist for lower body dressing: Maximal Assistance - Patient 25 - 49%     Toileting Toileting    Toileting assist Assist for toileting: Maximal Assistance - Patient 25 - 49%     Transfers Chair/bed transfer  Transfers assist  Chair/bed transfer activity did not occur: Safety/medical concerns  Chair/bed transfer assist level: Moderate Assistance - Patient 50 - 74%     Locomotion Ambulation   Ambulation assist   Ambulation activity did not occur: Safety/medical concerns  Assist level: 2 helpers Assistive device: Other (comment)(hallway rail) Max distance: 30ft   Walk 10 feet activity   Assist  Walk 10 feet activity did not occur: Safety/medical concerns  Assist level: 2 helpers Assistive device: Other (comment)(hallway rail)   Walk 50 feet activity   Assist Walk 50 feet with 2 turns activity did not occur: Safety/medical concerns         Walk 150 feet activity   Assist Walk 150 feet activity did not occur: Safety/medical concerns  Walk 10 feet on uneven surface  activity   Assist Walk 10 feet on uneven surfaces activity did not occur: Safety/medical concerns         Wheelchair     Assist Will patient use wheelchair at discharge?: Yes Type of Wheelchair: Manual Wheelchair activity did not occur: Safety/medical concerns  Wheelchair assist level:  Minimal Assistance - Patient > 75% Max wheelchair distance: 60 ft    Wheelchair 50 feet with 2 turns activity    Assist    Wheelchair 50 feet with 2 turns activity did not occur: Safety/medical concerns   Assist Level: Minimal Assistance - Patient > 75%   Wheelchair 150 feet activity     Assist Wheelchair 150 feet activity did not occur: Safety/medical concerns   Assist Level: Minimal Assistance - Patient > 75%    Medical Problem List and Plan: 1.Left side weakness with facial droopas well as left hemi-sensory losssecondary to right thalamic hemorrhage with IVH secondary to hypertensive crisis  Continue CIR, PT, OT, SLP  -reviewed a few discharge and prognostic considerations today with patient 2. Antithrombotics: -DVT/anticoagulation:SCDs -antiplatelet therapy: N/A 3. Pain Management:Tylenol as needed 4. Mood:Abilify 5 mg daily, Effexor 300 mg daily. Provide emotional support appreciate psychiatry note -antipsychotic agents: Abilify Major depressive disorder 5. Neuropsych: This patientiscapable of making decisions on hisown behalf. 6. Skin/Wound Care:Routine skin checks 7. Fluids/Electrolytes/Nutrition:Routine ins and outs   Poor p.o. intake- Remeron started on 4/3 --intake excellent   8.  Essential hypertension  Cont Lisinopril 20 mg daily,   Maxide 37.5-25 mg daily DC'd on 4/4 due to hyponatremia  Monitor with increased mobility Vitals:   01/25/19 1920 01/26/19 0501  BP: 100/82 130/88  Pulse: 87 90  Resp: 14 16  Temp: (!) 97.5 F (36.4 C) (!) 97.4 F (36.3 C)  SpO2: 93% 97%   Amlodipine increased to 5mg  on 3/29  Controlled on 4/18 9. BPH. Flomax 0.4 mg daily, removed  Foley  10. History of GI bleed . Continue Protonix 11. Hyperlipidemia. Resume Lipitor on discharge. 12.  Constipation-bowel meds increased on 3/30, again on 3/31, again on 4/1     13.  Prediabetes  Diet changed to carb modified   SSI  ordered on 4/2   CBG (last 3)  Recent Labs    01/25/19 1653 01/25/19 2130 01/26/19 0628  GLUCAP 126* 159* 145*  fair control 4/18. Consider oral agent?? 14.  Hyponatremia  Sodium 126 on 4/4, 129 on 4/6, asymptomatic  Improved to 131 4/10  Continue to monitor 15.  Sleep disturbance-   remeron   15mg    - ritalin 10mg   in am to help with am somnolence,  ECG on 3/24  reviewed, trazodone 50 started on 3/30, DC'd on 4/3   Sleep chart ordered 16. Renal:   -[id number].  AKI  Creatinine 0.88 on 4/4  Off IVF  Encourage fluids    17.  Leukocytosis- WBCs 12.5 on 4/3, 13.8 on 4/6, remains afebrile     Continue to monitor 18.  Bladder issues - urgency likely multifactorial  CVA Meds , prostate,   -stopped urecholine, monitor for retention   LOS: 22 days A FACE TO FACE EVALUATION WAS PERFORMED  Ranelle OysterZachary T Ames Hoban 01/26/2019, 10:56 AM

## 2019-01-26 NOTE — Progress Notes (Signed)
Physical Therapy Weekly Progress Note  Patient Details  Name: Patrick Pollard MRN: [id number] Date of Birth: 07/21/1946  Beginning of progress report period: December 19, 2018 End of progress report period: December 26, 2018  Patient has met 3 of 4 short term goals. Patient progressing well with therapy; however, continues to require mod assist for bed mobility for trunk support. Patient demonstrates improvements in activity tolerance, core strength/sitting balance, bed<>chair transfers, standing balance, sit<>stand transfers, and gait by requiring less assistance for transfers and being able to ambulate greater distances and in parallel bars with more degrees of freedom.   Patient continues to demonstrate the following deficits muscle weakness and muscle paralysis, decreased cardiorespiratoy endurance, impaired timing and sequencing, abnormal tone, unbalanced muscle activation, decreased coordination and decreased motor planning, decreased midline orientation, decreased attention to left and decreased motor planning and decreased sitting balance, decreased standing balance, decreased postural control, hemiplegia and decreased balance strategies and therefore will continue to benefit from skilled PT intervention to increase functional independence with mobility.  Patient progressing toward long term goals..  Continue plan of care.  PT Short Term Goals Week 3:  PT Short Term Goal 1 (Week 3): Pt will perform supine<>sit with no more than min assist of 1 PT Short Term Goal 1 - Progress (Week 3): Not met PT Short Term Goal 2 (Week 3): Pt will perform bed<>chair transfer using LRAD with no more than min assist of 1 PT Short Term Goal 2 - Progress (Week 3): Met PT Short Term Goal 3 (Week 3): Pt will perform sit<>stand using LRAD with no more than mod assist of 1 PT Short Term Goal 3 - Progress (Week 3): Met PT Short Term Goal 4 (Week 3): Pt will ambulate at least 15ft using LRAD with no more than max  assist of 1 PT Short Term Goal 4 - Progress (Week 3): Met Week 4:  PT Short Term Goal 1 (Week 4): = to LTG due to ELOS  Skilled Therapeutic Interventions/Progress Updates:  Ambulation/gait training;Balance/vestibular training;Cognitive remediation/compensation;Community reintegration;Discharge planning;Disease management/prevention;DME/adaptive equipment instruction;Functional electrical stimulation;Functional mobility training;Neuromuscular re-education;Patient/family education;Pain management;Psychosocial support;Skin care/wound management;Splinting/orthotics;Stair training;Therapeutic Activities;Therapeutic Exercise;UE/LE Coordination activities;UE/LE Strength taining/ROM;Visual/perceptual remediation/compensation;Wheelchair propulsion/positioning   Therapy Documentation Precautions:  Precautions Precautions: Fall Precaution Comments: left shoulder subluxation Restrictions Weight Bearing Restrictions: No   M , PT, DPT 01/26/2019, 7:59 AM  

## 2019-01-27 ENCOUNTER — Inpatient Hospital Stay (HOSPITAL_COMMUNITY): Payer: Self-pay

## 2019-01-27 LAB — GLUCOSE, CAPILLARY
Glucose-Capillary: 161 mg/dL — ABNORMAL HIGH (ref 70–99)
Glucose-Capillary: 192 mg/dL — ABNORMAL HIGH (ref 70–99)
Glucose-Capillary: 129 mg/dL — ABNORMAL HIGH (ref 70–99)
Glucose-Capillary: 205 mg/dL — ABNORMAL HIGH (ref 70–99)

## 2019-01-27 NOTE — Progress Notes (Signed)
[place] PHYSICAL MEDICINE & REHABILITATION PROGRESS NOTE   Subjective/Complaints: In good spirits. Had some difficulties sleeping last night though  ROS: Patient denies fever, rash, sore throat, blurred vision, nausea, vomiting, diarrhea, cough, shortness of breath or chest pain, joint or back pain, headache, or mood change. .    Objective:   No results found. No results for input(s): WBC, HGB, HCT, PLT in the last 72 hours. No results for input(s): NA, K, CL, CO2, GLUCOSE, BUN, CREATININE, CALCIUM in the last 72 hours.  Intake/Output Summary (Last 24 hours) at 01/27/2019 1049 Last data filed at 01/27/2019 0950 Gross per 24 hour  Intake 700 ml  Output 1900 ml  Net -1200 ml     Physical Exam: Vital Signs Blood pressure 111/66, pulse 75, temperature 97.9 F (36.6 C), temperature source Oral, resp. rate 16, height 6\' 1"  (1.854 m), weight 96.2 kg, SpO2 92 %. Constitutional: No distress . Vital signs reviewed. HEENT: EOMI, oral membranes moist Neck: supple Cardiovascular: RRR without murmur. No JVD    Respiratory: CTA Bilaterally without wheezes or rales. Normal effort    GI: BS +, non-tender, non-distended  Musc: No edema or tenderness in extremities. Neuro: Alert Dysarthria Motor:  Left upper extremity:handgrip, biceps and triceps  3-/5, Left lower extremity: 1/5 hip flexion, 1/5 distally plantar flexion--no ADF Sensation absent to LT and proprioception  Skin:intact Psych: anxious  Assessment/Plan: 1. 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  Physiatrist is providing close team supervision and 24 hour management of active medical problems listed below.  Physiatrist and rehab team continue to assess barriers to discharge/monitor patient progress toward functional and medical goals  Care Tool:  Bathing    Body parts bathed by patient: Left arm, Chest,  Abdomen, Face, Left upper leg, Right upper leg, Right lower leg, Left lower leg   Body parts bathed by helper: Right arm, Buttocks, Front perineal area Body parts n/a: Left lower leg   Bathing assist Assist Level: Moderate Assistance - Patient 50 - 74%     Upper Body Dressing/Undressing Upper body dressing   What is the patient wearing?: Pull over shirt    Upper body assist Assist Level: Moderate Assistance - Patient 50 - 74%    Lower Body Dressing/Undressing Lower body dressing      What is the patient wearing?: Pants, Incontinence brief     Lower body assist Assist for lower body dressing: Maximal Assistance - Patient 25 - 49%     Toileting Toileting    Toileting assist Assist for toileting: Maximal Assistance - Patient 25 - 49%     Transfers Chair/bed transfer  Transfers assist  Chair/bed transfer activity did not occur: Safety/medical concerns  Chair/bed transfer assist level: Moderate Assistance - Patient 50 - 74%     Locomotion Ambulation   Ambulation assist   Ambulation activity did not occur: Safety/medical concerns  Assist level: Maximal Assistance - Patient 25 - 49% Assistive device: (rail) Max distance: 15 ft   Walk 10 feet activity   Assist  Walk 10 feet activity did not occur: Safety/medical concerns  Assist level: Maximal Assistance - Patient 25 - 49% Assistive device: Other (comment)(rail)   Walk 50 feet activity   Assist Walk 50 feet with 2 turns activity did not occur: Safety/medical concerns         Walk 150 feet activity   Assist Walk 150 feet activity did not occur:  Safety/medical concerns         Walk 10 feet on uneven surface  activity   Assist Walk 10 feet on uneven surfaces activity did not occur: Safety/medical concerns         Wheelchair     Assist Will patient use wheelchair at discharge?: Yes Type of Wheelchair: Manual Wheelchair activity did not occur: Safety/medical concerns  Wheelchair  assist level: Minimal Assistance - Patient > 75% Max wheelchair distance: 60 ft    Wheelchair 50 feet with 2 turns activity    Assist    Wheelchair 50 feet with 2 turns activity did not occur: Safety/medical concerns   Assist Level: Minimal Assistance - Patient > 75%   Wheelchair 150 feet activity     Assist Wheelchair 150 feet activity did not occur: Safety/medical concerns   Assist Level: Minimal Assistance - Patient > 75%    Medical Problem List and Plan: 1.Left side weakness with facial droopas well as left hemi-sensory losssecondary to right thalamic hemorrhage with IVH secondary to hypertensive crisis  Continue CIR, PT, OT, SLP    2. Antithrombotics: -DVT/anticoagulation:SCDs -antiplatelet therapy: N/A 3. Pain Management:Tylenol as needed 4. Mood:Abilify 5 mg daily, Effexor 300 mg daily. Provide emotional support appreciate psychiatry note -antipsychotic agents: Abilify  Major depressive disorder 5. Neuropsych: This patientiscapable of making decisions on hisown behalf. 6. Skin/Wound Care:Routine skin checks 7. Fluids/Electrolytes/Nutrition:Routine ins and outs   Poor p.o. intake- Remeron started on 4/3 --intake excellent   8.  Essential hypertension  Cont Lisinopril 20 mg daily,   Maxide 37.5-25 mg daily DC'd on 4/4 due to hyponatremia  Monitor with increased mobility Vitals:   01/26/19 1912 01/27/19 0439  BP: 104/74 111/66  Pulse: (!) 103 75  Resp: 17 16  Temp: 98.6 F (37 C) 97.9 F (36.6 C)  SpO2: 95% 92%   Amlodipine increased to 5mg  on 3/29  Controlled on 4/19 9. BPH. Flomax 0.4 mg daily, removed  Foley  10. History of GI bleed . Continue Protonix 11. Hyperlipidemia. Resume Lipitor on discharge. 12.  Constipation-bowel meds increased on 3/30, again on 3/31, again on 4/1     13.  Prediabetes  Diet changed to carb modified   SSI ordered on 4/2   CBG (last 3)  Recent Labs    01/26/19 1701  01/26/19 2128 01/27/19 0626  GLUCAP 173* 121* 161*  continued borderline to elevated reading--consider oral agent 14.  Hyponatremia  Sodium 126 on 4/4, 129 on 4/6, asymptomatic  Improved to 131 4/10  Continue to monitor 15.  Sleep disturbance-   remeron   15mg    - ritalin 10mg   in am to help with am somnolence,  ECG on 3/24     trazodone 50 started on 3/30, DC'd on 4/3   Sleep chart ordered  -consider low dose seroquel for sleep 16. Renal:   -Improving416.  AKI  Creatinine 0.88 on 4/4  Off IVF  Encourage fluids    17.  Leukocytosis- WBCs 12.5 on 4/3, 13.8 on 4/6, remains afebrile     Continue to monitor 18.  Bladder issues - urgency likely multifactorial  CVA Meds , prostate,   -stopped urecholine, seems to be emptying well now   LOS: 23 days A FACE TO FACE EVALUATION WAS PERFORMED  Ranelle OysterZachary T  01/27/2019, 10:49 AM

## 2019-01-27 NOTE — Progress Notes (Signed)
Physical Therapy Session Note  Patient Details  Name: Patrick Pollard MRN: [id number] Date of Birth: 28-Sep-1946  Today's Date: 01/27/2019 PT Individual Time: 0800-0915 and 1300- 1400 PT Individual Time Calculation (min): 75 min  And 60 min  Short Term Goals: Week 4:  PT Short Term Goal 1 (Week 4): = to LTG due to ELOS  Skilled Therapeutic Interventions/Progress Updates:    Session 1: Pt supine in bed upon PT arrival, agreeable to therapy tx and denies pain. Pt requesting to use the urinal, therapist assisted with set up. Pt supine in bed donned pants, socks and shoes with assist from therapist. Total assist for shoes and teds, pt performed bridging with min assist while pulling pants over hips. While supine in bed pt performed x 10 bridges with R LE more extended for L LE neuro re-ed, and 2 x 10 quad sets with L LE for neuro re-ed, therapist providing cues for techniques. Therapist took video on pt's ipad for visual feedback of quad activation, pt educated on importance of performing this exercise as part of HEP and between therapies. Pt transferred to sitting with mod assist and use of bedrails. Pt performed squat pivot to w/c with mod assist. Pt transported to the ortho gym. Pt provided measurements for cars at home: 22.5 in for [employer] and 31 inches for the [place]. Therapist educated pt on car transfer and recommends using slideboard with sedan at this time. Pt performed slideboard transfer from w/c<>car this session with mod assist, verbal cues for techniques. Pt ambulated x 15 ft this session with R rail and max assist, pt advancing L LE during swing with use of DF ace wrap and sock only on L foot, therapist blocking L knee during stance. Pt performed x 2 sit<>stands from w/c with RW this session mod assist, in standing pt worked on L LE stance control and quad activation while performing stepping in place with R LE, mod assist. Pt called daughter, therapist provided education on pt's current  status and recommendations for car transfer. Pt left in w/c with needs in reach and chair alarm set.   Session 2: Pt supine in bed upon PT arrival, agreeable to therapy tx and denies pain. Pt transferred to sitting with mod assist. Pt wanted to use his personal ipad to video tape a stedy transfer to show his family, pt performed sit<>stand within stedy and stedy transfer to w/c with therapist educating on techniques. Pt transported to the gym. S Pt performed sit<>stands from w/c to RW this session with mod assist. Standing with RW pt worked on static standing balance with min-mod assist and cues for L knee extension/quad activation. Pt worked on L stance control while stepping in place with R LE using RW for UE support, mod assist. Pt ambulated x 3 ft with RW and max assist, +2 for w/c follow. lideboard transfer to the mat with min assist. Pt performed lateral scooting on the mat in each direction, emphasis on pushing through LEs to clear hips. Pt performed x 5 partial sti<>stands from mat without Ad emphasis on clearing hips from mat, mod assist. Pt performed x 4 sit<>stands this session from mat without AD and mod assist, therapist blocking L knee. Pt worked on dynamic standing balance with L quad activation while performing reaching activity, x 2 trials with min-mod assist for balance. Slideboard back to w/c with min assist and transported back to room, left in w/c with needs in reach and chair alarm set.   Therapy Documentation  Precautions:  Precautions Precautions: Fall Precaution Comments: left shoulder subluxation Restrictions Weight Bearing Restrictions: No   Therapy/Group: Individual Therapy  Cresenciano GenreEmily van Schagen, PT, DPT 01/27/2019, 7:56 AM

## 2019-01-28 ENCOUNTER — Inpatient Hospital Stay (HOSPITAL_COMMUNITY): Payer: Self-pay

## 2019-01-28 ENCOUNTER — Inpatient Hospital Stay (HOSPITAL_COMMUNITY): Payer: Self-pay | Admitting: Occupational Therapy

## 2019-01-28 ENCOUNTER — Inpatient Hospital Stay (HOSPITAL_COMMUNITY): Payer: Self-pay | Admitting: Physical Therapy

## 2019-01-28 LAB — GLUCOSE, CAPILLARY
Glucose-Capillary: 160 mg/dL — ABNORMAL HIGH (ref 70–99)
Glucose-Capillary: 94 mg/dL (ref 70–99)
Glucose-Capillary: 231 mg/dL — ABNORMAL HIGH (ref 70–99)
Glucose-Capillary: 109 mg/dL — ABNORMAL HIGH (ref 70–99)

## 2019-01-28 MED ORDER — METFORMIN HCL 500 MG PO TABS
250.0000 mg | ORAL_TABLET | Freq: Two times a day (BID) | ORAL | Status: DC
  Administered 2019-01-28 – 2019-02-07 (×21): 250 mg via ORAL
  Filled 2019-01-28 (×21): qty 1

## 2019-01-28 NOTE — Progress Notes (Signed)
Physical Therapy Session Note  Patient Details  Name: Patrick Pollard MRN: [id number] Date of Birth: May 27, 1946  Today's Date: 01/28/2019 PT Individual Time: 1015-1043 PT Individual Time Calculation (min): 28 min   Short Term Goals: Week 4:  PT Short Term Goal 1 (Week 4): = to LTG due to ELOS  Skilled Therapeutic Interventions/Progress Updates:    Patient received up in [facility], pleasant and willing to work with therapy today. Transported him to PT gym totalA in [facility] for time management, then proceeded with standing practice in [place] with assist of +2 for safety today. Able to perform sit to stand with MinA from [facility] seat and pulling up on crossbar of stedy, however required close min guardx2 in stedy for balance and safety. Heavy VC and TC as well as visual feedback in mirror provided today for postural corrections in static stance with light U UE support, patient with difficulty maintaining midline in standing and shifting weight onto hemi LE. He was returned to his room totalA in [facility] in care of PT tech applying seat belt alarm, all needs otherwise met this morning.   Therapy Documentation Precautions:  Precautions Precautions: Fall Precaution Comments: left shoulder subluxation Restrictions Weight Bearing Restrictions: No General:    Pain: Pain Assessment Pain Scale: 0-10 Pain Score: 0-No pain    Therapy/Group: Individual Therapy   Deniece Ree PT, DPT, CBIS  Supplemental Physical Therapist [facility]    Pager [phone] Acute Rehab Office [phone]    01/28/2019, 12:59 PM

## 2019-01-28 NOTE — Plan of Care (Signed)
Nutrition Education Note  RD contacted by LCSW regarding nutrition education for HTN/stroke.  Spoke with pt, wife, and daughter Herbert Seta. Pt's wife and daughter were present via phone call on pt's personal phone.  RD provided "Hypertension Nutrition Therapy" handout from the [employer]. Will also mail a copy of the handout to pt's wife at the addressed provided.  Reviewed patient's dietary recall. Pt reports he uses a lot of [employer] Seasoning Salt on his vegetables and meat. 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  Provided examples on ways to decrease sodium intake in diet. Discouraged intake of processed foods and use of salt shaker. Encouraged fresh fruits and vegetables as well as whole grain sources of carbohydrates to maximize fiber intake. Discussed importance of limiting saturated fat intake and provided examples of foods high in saturated fat and low in saturated fat.  Pt's daughter and wife with various questions regarding appropriate beverage intake (diet soda, coffee, tea, sugar substitutes). All questions answered.  Teach back method used.  Expect good compliance.  Body mass index is 27.98 kg/m. Pt meets criteria for overweight based on current BMI.  Current diet order is Carb Modified, patient is consuming approximately 75-100% of meals at this time. Labs and medications reviewed. No further nutrition interventions warranted at this time. RD contact information provided. If additional nutrition issues arise, please re-consult RD.   Earma Reading, MS, RD, LDN Inpatient Clinical Dietitian Pager: [phone] Weekend/After Hours: [phone]

## 2019-01-28 NOTE — Progress Notes (Signed)
Physical Therapy Session Note  Patient Details  Name: Patrick Pollard MRN: [id number] Date of Birth: 22-Aug-1946  Today's Date: 01/28/2019 PT Individual Time: 1330-1415 and 1600-1700 PT Individual Time Calculation (min): 45 min and 60 min    Short Term Goals: Week 4:  PT Short Term Goal 1 (Week 4): = to LTG due to ELOS  Skilled Therapeutic Interventions/Progress Updates:    Session 1: Pt supine in bed upon PT arrival, agreeable to therapy tx and denies pain. Pt transferred to sitting EOB with mod assist, cues for techniques. Pt performed slideboard transfer to the w/c with min assist and cues for set up, transported in w/c to the gym. Pt performed slideboard transfers from w/c<>mat this session with min assist. Therapist provided pt with sling for L UE comfort. Pt performed x 5 sit<>stands from the mat throughout session with mod assist and without AD working on L LE neuro re-ed and strengthening. While standing pt worked on static standing balance without UE support while working on L quad activation, x 2 trials.  Pt worked on L LE stance control while performing stepping in place with R LE, no AD and mod assist, x 2 trials with cues for techniques. Pt performed x 3 sit<>stands from elevated mat with mod assist, therapist facilitating L LE weightbearing for neuro re-ed and strengthening. Pt transferred back to w/c with slideboard and min assist, transported back to room and left seated with needs in reach and chair alarm set.   Session 2: Pt supine in bed upon PT arrival, agreeable to therapy tx and denies pain. Pt transferred to sitting EOB with mod assist, cues for techniques. Pt performed slideboard transfer to the w/c with min assist and cues for set up, transported in w/c to the gym. Pt performed slideboard transfers from w/c<>mat this session with min assist. Orthotist present this session for orthotic fitting. Trial of L GRAFO this session. Pt performed sit<>stands from mat with and without  RW, min-mod assist with improved L quad control and activation. Pt ambulated x 8 ft and x 6 ft this session with RW and max assist, use of L GRAFO with therapist facilitating L knee extension and assisting with L swing, pt with some minor hip flexor activation. Pt ambulated x 15 ft at R rail with mod assist and L GRAFO, cues for sequencing and techniques. Pt transported back to room and left in w/c with needs in reach and chair alarm set.    Therapy Documentation Precautions:  Precautions Precautions: Fall Precaution Comments: left shoulder subluxation Restrictions Weight Bearing Restrictions: No    Therapy/Group: Individual Therapy  Cresenciano Genre, PT, DPT 01/28/2019, 1:58 PM

## 2019-01-28 NOTE — Progress Notes (Signed)
Orthopedic Tech Progress Note Patient Details:  Patrick Pollard 15-Feb-1946 [id number]  Ortho Devices Type of Ortho Device: Shoulder immobilizer Ortho Device/Splint Location: left Ortho Device/Splint Interventions: Application   Post Interventions Patient Tolerated: Well Instructions Provided: Care of device   Saul Fordyce 01/28/2019, 3:22 PM

## 2019-01-28 NOTE — Progress Notes (Signed)
Occupational Therapy Session Note  Patient Details  Name: Patrick Pollard MRN: [id number] Date of Birth: October 20, 1945  Today's Date: 01/28/2019 OT Individual Time: 0915-1000 OT Individual Time Calculation (min): 45 min    Short Term Goals: Week 1:  OT Short Term Goal 1 (Week 1): Pt will maintain static sitting balance at EOB/EOM for 5 min wiht MOD A OT Short Term Goal 1 - Progress (Week 1): Not met OT Short Term Goal 2 (Week 1): Pt will recall hemi dressing techniques wiht min VC OT Short Term Goal 2 - Progress (Week 1): Not met OT Short Term Goal 3 (Week 1): Pt will sit to stand in stedy iwht MAX A of 1 to decrease caregiver burden OT Short Term Goal 3 - Progress (Week 1): Not met OT Short Term Goal 4 (Week 1): Pt will manage LUE during transfers wiht no more than 1 VC for L attention OT Short Term Goal 4 - Progress (Week 1): Not met OT Short Term Goal 5 (Week 1): pt will locate all items on L of sink iwht no VC during grooming tasks OT Short Term Goal 5 - Progress (Week 1): Not met Week 2:  OT Short Term Goal 1 (Week 2): Pt will maintain static sitting balance at EOB/EOM for 5 min wiht MOD A OT Short Term Goal 1 - Progress (Week 2): Met OT Short Term Goal 2 (Week 2): Pt will recall hemi dressing techniques wiht min VC OT Short Term Goal 2 - Progress (Week 2): Met OT Short Term Goal 3 (Week 2): Pt will sit to stand in stedy with MAX A of 1 to decrease caregiver burden OT Short Term Goal 3 - Progress (Week 2): Met OT Short Term Goal 4 (Week 2): Pt will manage LUE during transfers wiht no more than 1 VC for L attention. OT Short Term Goal 4 - Progress (Week 2): Not met OT Short Term Goal 5 (Week 2): Pt will locate all items on L of sink iwht no more than min instructional cueing during grooming tasks. OT Short Term Goal 5 - Progress (Week 2): Met Week 3:  OT Short Term Goal 1 (Week 3): Pt will donn pants sit to stand with max assist during session. OT Short Term Goal 1 - Progress (Week  3): Met OT Short Term Goal 2 (Week 3): Pt will donn pullover shirt with min assist in supported sitting.  OT Short Term Goal 2 - Progress (Week 3): Met OT Short Term Goal 3 (Week 3): Pt will complete toilet transfer squat pivot with mod assist to both the left and right.  OT Short Term Goal 3 - Progress (Week 3): Not met OT Short Term Goal 4 (Week 3): Pt will use the LUE as a gross assist with selfcare tasks with mod facilitation during bathing tasks. OT Short Term Goal 4 - Progress (Week 3): Not met OT Short Term Goal 5 (Week 3): Pt will perform LB bathing with max assist.  OT Short Term Goal 5 - Progress (Week 3): Met Week 4:  OT Short Term Goal 1 (Week 4): Continue working on established LTGs set at min to mod assist for discharge.   Skilled Therapeutic Interventions/Progress Updates:    Pt received in bed and agreeable to therapy.  To begin, worked on LB bathing and dressing from bed level.  Focused on utilizing active hip abd/add of L leg with rolling and pt using R arm to support L with rolling using his head and torso  to lead movement versus pulling on the rails.    When therapy tech arrived, pt sat to EOB and with +2 pt stood with max A to RW with support at L knee to pull pants over hips.     He then completed a slide board transfer to W/c to his R side with min-mod A and cues for leaning head in opposite direction to assist with transfer.  Once he did this, the transfer was min A.    Pt sat at sink for UB self care.  Used Hand over hand guiding to use L arm to wash chest and abdomen, he has active grasp to hold wash cloth.   Pt donned shirt with min A by putting R arm in with A then his head then fixed his R shoulder and then his L arm.  Pt liked this technique.   Completed oral care at sink.   Belt alarm on and all needs met.  Therapy Documentation Precautions:  Precautions Precautions: Fall Precaution Comments: left shoulder subluxation Restrictions Weight Bearing  Restrictions: No   Pain: Pain Assessment Pain Scale: 0-10 Pain Score: 0-No pain   Therapy/Group: Individual Therapy  [place] 01/28/2019, 12:22 PM

## 2019-01-28 NOTE — Progress Notes (Signed)
Speech Language Pathology Daily Session Note  Patient Details  Name: Patrick Pollard MRN: [id number] Date of Birth: [birth year]  Today's Date: 01/28/2019 SLP Individual Time: 1115-1205 SLP Individual Time Calculation (min): 50 min  Short Term Goals: Week 4: SLP Short Term Goal 1 (Week 4): STGs=LTGs d/t remaining length of stay  Skilled Therapeutic Interventions: Skilled ST services focused on cognitive skills. Pt's daughter presents for session via speaker phone. Pt demonstrated recall of new medication addition for pervious medication management task with supervision A verbal cues. SLP facilitated complex problem solving skills with scheduling task, pt required max A verbal cues for organization of complex language, however pt complained of inability to focus due to discomfort sitting in WC. SLP and nurse, assisted in transfer from stedy to bed with min A. Pt was left in room with call bell within reach and bed alarm set. ST recommends to continue skilled ST services.      Pain Pain Assessment Pain Scale: 0-10 Pain Score: 0-No pain  Therapy/Group: Individual Therapy  Louetta Hollingshead  [facility] 01/28/2019, 12:17 PM

## 2019-01-28 NOTE — Progress Notes (Signed)
[place] PHYSICAL MEDICINE & REHABILITATION PROGRESS NOTE   Subjective/Complaints:  Discussed upper ext recovery as well as some sensation in the Left foot  ROS: Patient denies . Marland Kitchen.    Objective:   No results found. No results for input(s): WBC, HGB, HCT, PLT in the last 72 hours. No results for input(s): NA, K, CL, CO2, GLUCOSE, BUN, CREATININE, CALCIUM in the last 72 hours.  Intake/Output Summary (Last 24 hours) at 01/28/2019 0828 Last data filed at 01/28/2019 0749 Gross per 24 hour  Intake 1320 ml  Output 1950 ml  Net -630 ml     Physical Exam: Vital Signs Blood pressure 115/75, pulse 90, temperature 97.7 F (36.5 C), temperature source Oral, resp. rate 16, height 6\' 1"  (1.854 m), weight 96.2 kg, SpO2 90 %. Constitutional: No distress . Vital signs reviewed. HEENT: EOMI, oral membranes moist Neck: supple Cardiovascular: RRR without murmur. No JVD    Respiratory: CTA Bilaterally without wheezes or rales. Normal effort    GI: BS +, non-tender, non-distended  Musc: No edema or tenderness in extremities. Neuro: Alert Dysarthria Motor:  3- Left upper extremity:handgrip, 3- biceps and 2- triceps  3-/5, Left lower extremity: 1/5 hip flexion, 2/5 distally plantar flexion--no ADF Sensation absent to LT and proprioception  Skin:intact Psych: anxious  Assessment/Plan: 1. 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  Physiatrist is providing close team supervision and 24 hour management of active medical problems listed below.  Physiatrist and rehab team continue to assess barriers to discharge/monitor patient progress toward functional and medical goals  Care Tool:  Bathing    Body parts bathed by patient: Left arm, Chest, Abdomen, Face, Left upper leg, Right upper leg, Right lower leg, Left lower leg   Body parts bathed by helper: Right arm, Buttocks, Front  perineal area Body parts n/a: Left lower leg   Bathing assist Assist Level: Moderate Assistance - Patient 50 - 74%     Upper Body Dressing/Undressing Upper body dressing   What is the patient wearing?: Pull over shirt    Upper body assist Assist Level: Moderate Assistance - Patient 50 - 74%    Lower Body Dressing/Undressing Lower body dressing      What is the patient wearing?: Pants, Incontinence brief     Lower body assist Assist for lower body dressing: Maximal Assistance - Patient 25 - 49%     Toileting Toileting    Toileting assist Assist for toileting: Maximal Assistance - Patient 25 - 49%     Transfers Chair/bed transfer  Transfers assist  Chair/bed transfer activity did not occur: Safety/medical concerns  Chair/bed transfer assist level: Moderate Assistance - Patient 50 - 74%     Locomotion Ambulation   Ambulation assist   Ambulation activity did not occur: Safety/medical concerns  Assist level: Maximal Assistance - Patient 25 - 49% Assistive device: (rail) Max distance: 15 ft   Walk 10 feet activity   Assist  Walk 10 feet activity did not occur: Safety/medical concerns  Assist level: Maximal Assistance - Patient 25 - 49% Assistive device: Other (comment)(rail)   Walk 50 feet activity   Assist Walk 50 feet with 2 turns activity did not occur: Safety/medical concerns         Walk 150 feet activity   Assist Walk 150 feet activity did not occur: Safety/medical concerns         Walk 10 feet on uneven surface  activity   Assist Walk 10 feet on uneven surfaces activity did not occur: Safety/medical concerns         Wheelchair     Assist Will patient use wheelchair at discharge?: Yes Type of Wheelchair: Manual Wheelchair activity did not occur: Safety/medical concerns  Wheelchair assist level: Minimal Assistance - Patient > 75% Max wheelchair distance: 60 ft    Wheelchair 50 feet with 2 turns  activity    Assist    Wheelchair 50 feet with 2 turns activity did not occur: Safety/medical concerns   Assist Level: Minimal Assistance - Patient > 75%   Wheelchair 150 feet activity     Assist Wheelchair 150 feet activity did not occur: Safety/medical concerns   Assist Level: Minimal Assistance - Patient > 75%    Medical Problem List and Plan: 1.Left side weakness with facial droopas well as left hemi-sensory losssecondary to right thalamic hemorrhage with IVH secondary to hypertensive crisis  Continue CIR, PT, OT, SLP    2. Antithrombotics: -DVT/anticoagulation:SCDs -antiplatelet therapy: N/A 3. Pain Management:Tylenol as needed 4. Mood:Abilify 5 mg daily, Effexor 300 mg daily. Provide emotional support appreciate psychiatry note -antipsychotic agents: Abilify  Major depressive disorder 5. Neuropsych: This patientiscapable of making decisions on hisown behalf. 6. Skin/Wound Care:Routine skin checks 7. Fluids/Electrolytes/Nutrition:Routine ins and outs   Poor p.o. intake- Remeron started on 4/3 --intake excellent   8.  Essential hypertension  Cont Lisinopril 20 mg daily,   Maxide 37.5-25 mg daily DC'd on 4/4 due to hyponatremia  Monitor with increased mobility Vitals:   01/27/19 1930 01/28/19 0430  BP: 112/76 115/75  Pulse: 92 90  Resp: 16 16  Temp: 98 F (36.7 C) 97.7 F (36.5 C)  SpO2: 94% 90%   Amlodipine increased to 5mg  on 3/29  Controlled on 4/20 9. BPH. Flomax 0.4 mg daily, removed  Foley  10. History of GI bleed . Continue Protonix 11. Hyperlipidemia. Resume Lipitor on discharge. 12.  Constipation-bowel meds increased on 3/30, again on 3/31, again on 4/1     13.  Prediabetes  Diet changed to carb modified   SSI ordered on 4/2   CBG (last 3)  Recent Labs    01/27/19 1736 01/27/19 2057 01/28/19 0619  GLUCAP 129* 205* 160*  increasing add low dose metformin 14.  Hyponatremia  Sodium 126 on  4/4, 129 on 4/6, asymptomatic  Improved to 131 4/10  Continue to monitor 15.  Sleep disturbance-   remeron   15mg    - ritalin 10mg   in am to help with am somnolence,  ECG on 3/24     trazodone 50 started on 3/30, DC'd on 4/3   Sleep chart ordered  -consider low dose seroquel for sleep 16. Renal:   -Improving416.  AKI  Creatinine 0.88 on 4/4  Off IVF  Encourage fluids    17.  Leukocytosis- WBCs 12.5 on 4/3, 13.8 on 4/6, remains afebrile     Continue to monitor 18.  Bladder issues - urgency likely multifactorial  CVA Meds , prostate,   -stopped urecholine, seems to be emptying well now   LOS: 24 days A FACE TO FACE EVALUATION WAS PERFORMED  Erick Colace 01/28/2019, 8:28 AM

## 2019-01-29 ENCOUNTER — Inpatient Hospital Stay (HOSPITAL_COMMUNITY): Payer: Self-pay | Admitting: Physical Therapy

## 2019-01-29 ENCOUNTER — Inpatient Hospital Stay (HOSPITAL_COMMUNITY): Payer: Self-pay | Admitting: Occupational Therapy

## 2019-01-29 ENCOUNTER — Inpatient Hospital Stay (HOSPITAL_COMMUNITY): Payer: Self-pay

## 2019-01-29 LAB — GLUCOSE, CAPILLARY
Glucose-Capillary: 149 mg/dL — ABNORMAL HIGH (ref 70–99)
Glucose-Capillary: 133 mg/dL — ABNORMAL HIGH (ref 70–99)
Glucose-Capillary: 130 mg/dL — ABNORMAL HIGH (ref 70–99)

## 2019-01-29 NOTE — Progress Notes (Signed)
[place] PHYSICAL MEDICINE & REHABILITATION PROGRESS NOTE   Subjective/Complaints:  Pt took a few steps with therapy Pt has sensation for urine but needs setup with urinal use  ROS: Patient denies . Marland Kitchen.    Objective:   No results found. No results for input(s): WBC, HGB, HCT, PLT in the last 72 hours. No results for input(s): NA, K, CL, CO2, GLUCOSE, BUN, CREATININE, CALCIUM in the last 72 hours.  Intake/Output Summary (Last 24 hours) at 01/29/2019 0952 Last data filed at 01/29/2019 0758 Gross per 24 hour  Intake 666 ml  Output 2300 ml  Net -1634 ml     Physical Exam: Vital Signs Blood pressure 131/84, pulse 96, temperature 98.1 F (36.7 C), resp. rate 20, height 6\' 1"  (1.854 m), weight 96.2 kg, SpO2 95 %. Constitutional: No distress . Vital signs reviewed. HEENT: EOMI, oral membranes moist Neck: supple Cardiovascular: RRR without murmur. No JVD    Respiratory: CTA Bilaterally without wheezes or rales. Normal effort    GI: BS +, non-tender, non-distended  Musc: No edema or tenderness in extremities. Neuro: Alert Dysarthria Motor:  3- Left upper extremity:handgrip, 3- biceps and 2- triceps  3-/5, Left lower extremity: 1/5 hip flexion, 2/5 distally plantar flexion--no ADF Sensation absent to LT and proprioception  Skin:intact Psych: anxious  Assessment/Plan: 1. 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  Physiatrist is providing close team supervision and 24 hour management of active medical problems listed below.  Physiatrist and rehab team continue to assess barriers to discharge/monitor patient progress toward functional and medical goals  Care Tool:  Bathing    Body parts bathed by patient: Left arm, Chest, Abdomen, Face, Left upper leg, Right upper leg, Right lower leg, Left lower leg, Front perineal area   Body parts bathed by helper: Buttocks,  Right arm Body parts n/a: Left lower leg   Bathing assist Assist Level: Moderate Assistance - Patient 50 - 74%     Upper Body Dressing/Undressing Upper body dressing   What is the patient wearing?: Pull over shirt    Upper body assist Assist Level: Minimal Assistance - Patient > 75%    Lower Body Dressing/Undressing Lower body dressing      What is the patient wearing?: Pants, Incontinence brief     Lower body assist Assist for lower body dressing: Maximal Assistance - Patient 25 - 49%     Toileting Toileting    Toileting assist Assist for toileting: Maximal Assistance - Patient 25 - 49%     Transfers Chair/bed transfer  Transfers assist  Chair/bed transfer activity did not occur: Safety/medical concerns  Chair/bed transfer assist level: Moderate Assistance - Patient 50 - 74%     Locomotion Ambulation   Ambulation assist   Ambulation activity did not occur: Safety/medical concerns  Assist level: Maximal Assistance - Patient 25 - 49% Assistive device: (rail) Max distance: 15 ft   Walk 10 feet activity   Assist  Walk 10 feet activity did not occur: Safety/medical concerns  Assist level: Maximal Assistance - Patient 25 - 49% Assistive device: Other (comment)(rail)   Walk 50 feet activity   Assist Walk 50 feet with 2 turns activity did not occur: Safety/medical concerns         Walk 150 feet activity   Assist Walk 150 feet activity did not occur: Safety/medical concerns         Walk 10 feet on uneven  surface  activity   Assist Walk 10 feet on uneven surfaces activity did not occur: Safety/medical concerns         Wheelchair     Assist Will patient use wheelchair at discharge?: Yes Type of Wheelchair: Manual Wheelchair activity did not occur: Safety/medical concerns  Wheelchair assist level: Minimal Assistance - Patient > 75% Max wheelchair distance: 60 ft    Wheelchair 50 feet with 2 turns activity    Assist     Wheelchair 50 feet with 2 turns activity did not occur: Safety/medical concerns   Assist Level: Minimal Assistance - Patient > 75%   Wheelchair 150 feet activity     Assist Wheelchair 150 feet activity did not occur: Safety/medical concerns   Assist Level: Minimal Assistance - Patient > 75%    Medical Problem List and Plan: 1.Left side weakness with facial droopas well as left hemi-sensory losssecondary to right thalamic hemorrhage with IVH secondary to hypertensive crisis  Continue CIR, PT, OT, SLP Making better progress, antidepressants may be taking effect Team conf in am, may need to extend stay to maximize gains   2. Antithrombotics: -DVT/anticoagulation:SCDs -antiplatelet therapy: N/A 3. Pain Management:Tylenol as needed 4. Mood:Abilify 5 mg daily, Effexor 300 mg daily. Provide emotional support appreciate psychiatry note -antipsychotic agents: Abilify  Major depressive disorder 5. Neuropsych: This patientiscapable of making decisions on hisown behalf. 6. Skin/Wound Care:Routine skin checks 7. Fluids/Electrolytes/Nutrition:Routine ins and outs   Poor p.o. intake- Remeron started on 4/3 --intake excellent   8.  Essential hypertension  Cont Lisinopril 20 mg daily,   Maxide 37.5-25 mg daily DC'd on 4/4 due to hyponatremia  Monitor with increased mobility Vitals:   01/28/19 1927 01/29/19 0626  BP: 120/87 131/84  Pulse: 97 96  Resp: 20 20  Temp: 97.9 F (36.6 C) 98.1 F (36.7 C)  SpO2: 97% 95%   Amlodipine increased to 5mg  on 3/29  Controlled on 4/21 9. BPH. Flomax 0.4 mg daily, removed  Foley  10. History of GI bleed . Continue Protonix 11. Hyperlipidemia. Resume Lipitor on discharge. 12.  Constipation-bowel meds increased on 3/30, again on 3/31, again on 4/1     13.  Prediabetes  Diet changed to carb modified   SSI ordered on 4/2   CBG (last 3)  Recent Labs    01/28/19 1726 01/28/19 2135 01/29/19 0626   GLUCAP 231* 109* 149*  increasing add low dose metformin 250mg  BID started 4/20 14.  Hyponatremia  Sodium 126 on 4/4, 129 on 4/6, asymptomatic  Improved to 131 4/10  Continue to monitor 15.  Sleep disturbance-   remeron   15mg    - ritalin 10mg   in am to help with am somnolence,  ECG on 3/24     trazodone 50 started on 3/30, DC'd on 4/3   Sleep chart ordered  -consider low dose seroquel for sleep 16. Renal:   -Improving416.  AKI  Creatinine 0.88 on 4/4  Off IVF  Encourage fluids    17.  Leukocytosis- WBCs 12.5 on 4/3, 13.8 on 4/6, remains afebrile     Continue to monitor 18.  Bladder issues - urgency likely multifactorial  CVA Meds , prostate,   -stopped urecholine, seems to be emptying well now but still has CVA related urgency   LOS: 25 days A FACE TO FACE EVALUATION WAS PERFORMED  Erick Colace 01/29/2019, 9:52 AM

## 2019-01-29 NOTE — Progress Notes (Signed)
Occupational Therapy Session Note  Patient Details  Name: Patrick Pollard MRN: [id number] Date of Birth: 07/17/1946  Today's Date: 01/29/2019 OT Individual Time: 1033-1130 OT Individual Time Calculation (min): 57 min    Short Term Goals: Week 1:  OT Short Term Goal 1 (Week 1): Pt will maintain static sitting balance at EOB/EOM for 5 min wiht MOD A OT Short Term Goal 1 - Progress (Week 1): Not met OT Short Term Goal 2 (Week 1): Pt will recall hemi dressing techniques wiht min VC OT Short Term Goal 2 - Progress (Week 1): Not met OT Short Term Goal 3 (Week 1): Pt will sit to stand in stedy iwht MAX A of 1 to decrease caregiver burden OT Short Term Goal 3 - Progress (Week 1): Not met OT Short Term Goal 4 (Week 1): Pt will manage LUE during transfers wiht no more than 1 VC for L attention OT Short Term Goal 4 - Progress (Week 1): Not met OT Short Term Goal 5 (Week 1): pt will locate all items on L of sink iwht no VC during grooming tasks OT Short Term Goal 5 - Progress (Week 1): Not met  Skilled Therapeutic Interventions/Progress Updates:    Pt completed bathing and dressing during session.  He was able to complete stand pivot transfer with max assist to the tub bench in the walk-in shower.  He was able to remove his UB clothing with min instructional cueing and supervision.  LB clothing required max assist prior to shower.  He was then able to take his shower with max assist sit to stand.  Min assist for UB bathing with max hand over hand for integration of the LUE for washing the right arm and shoulder.  He was able to hold the washcloth and soap bottle with the left hand with supervision however.  Still with limited AROM in the left shoulder and elbow.  He was able to complete LB dressing with max assist sit to stand.  Dressing was completed with max assist sit to stand for LB and mod assist for UB.  Finished session with call button and phone in reach with safety alarm in place.    Therapy  Documentation Precautions:  Precautions Precautions: Fall Precaution Comments: left shoulder subluxation Restrictions Weight Bearing Restrictions: No   Pain: Pain Assessment Pain Scale: Faces Pain Score: 0-No pain ADL: See Care Tool section for some details of ADL  Therapy/Group: Individual Therapy  Ronnell Clinger OTR/L 01/29/2019, 11:45 AM

## 2019-01-29 NOTE — Progress Notes (Signed)
Speech Language Pathology Daily Session Note  Patient Details  Name: Bhargav Goyette MRN: [id number] Date of Birth: 1945/12/02  Today's Date: 01/29/2019 SLP Individual Time: 0900-0930 SLP Individual Time Calculation (min): 30 min  Short Term Goals: Week 4: SLP Short Term Goal 1 (Week 4): STGs=LTGs d/t remaining length of stay  Skilled Therapeutic Interventions: Skilled ST services focused on cognitive skills. SLP facilitated familiar and mildly complex problem solving skills utilizing medication management, pt required supervision A verbal cues for awareness of error. Pt expressed increase anexity with yesterday's ST task, scheduling task and its impact on performance. SLP educated pt they will attempt to make tasks as functional as possible, while also working on executive function skills and anexity strategies to break down unfamiliar tasks, pt agreed. Pt was left in room with call bell within reach and bed alarm set. ST recommends to continue skilled ST services.      Pain Pain Assessment Pain Scale: Faces Pain Score: 0-No pain  Therapy/Group: Individual Therapy  Jenasia Dolinar  [facility] 01/29/2019, 3:12 PM

## 2019-01-29 NOTE — Progress Notes (Signed)
Physical Therapy Session Note  Patient Details  Name: Patrick Pollard MRN: [id number] Date of Birth: 02-12-1946  Today's Date: 01/29/2019 PT Individual Time: 1333-1405 PT Individual Time Calculation (min): 32 min   Short Term Goals: Week 3:  PT Short Term Goal 1 (Week 3): Pt will perform supine<>sit with no more than min assist of 1 PT Short Term Goal 1 - Progress (Week 3): Not met PT Short Term Goal 2 (Week 3): Pt will perform bed<>chair transfer using LRAD with no more than min assist of 1 PT Short Term Goal 2 - Progress (Week 3): Met PT Short Term Goal 3 (Week 3): Pt will perform sit<>stand using LRAD with no more than mod assist of 1 PT Short Term Goal 3 - Progress (Week 3): Met PT Short Term Goal 4 (Week 3): Pt will ambulate at least 69f using LRAD with no more than max assist of 1 PT Short Term Goal 4 - Progress (Week 3): Met  Skilled Therapeutic Interventions/Progress Updates:   Pt received supine in bed and excited to participate with therapy. Pt performed supine>sidelying>sit with HOB partially elevated and using bedrails with min assist for trunk upright and cuing for sequencing. Pt performed sit<>stand from EOB to RW with assist for L UE placement and mod/max assist for lifting. Pt performed pre-gait stepping forwards/backwards with R LE focusing on L LE stance phase with pt demonstrating improved quadriceps activation but continues to require assist to prevent buckling Pt performed stand pivot transfer EOB to w/c using RW with max assist for L LE knee buckling and balance with cuing throughout for sequencing. Pt transported in w/c room<>hallway rail. Pt ambulated ~152fx 2 using RW with mod/max assist for L LE management during swing and stance phase as well as standing balance due to L lateral trunk lean - mirror provided for visual feedback and multimodal cuing throughout for sequencing of gait. Pt transported in w/c back to room. Performed L lateral scoot transfer with min assist  and cuing for increased anterior and R lateral trunk weight shifting for improved hip clearance. Pt performed sit<>stand from EOB to R UE handheld support and mod assist for lifting and blocking L LE knee buckling. Performed pre-gait tasks of stepping forward/backwards with R LE and L LE focusing on sequencing of lateral weight shifting to allow for swing phase on opposite LE - multimodal cuing for sequencing and manual facilitation for improved weight shifting with pt demonstrating most difficulty with stepping backwards with each LE. Performed sit to supine with mod assist for trunk descent and B LE management. Pt left supine in bed with needs in reach and bed alarm on.   Therapy Documentation Precautions:  Precautions Precautions: Fall Precaution Comments: left shoulder subluxation Restrictions Weight Bearing Restrictions: No  Pain: Reports L shoulder pain when placing hand on RW - unrated, but pt reports this shoulder is becoming more sensitive - therapist re-adjusted UE on AD and provided support during sit<>stand transfer for increased pt comfort.   Therapy/Group: Individual Therapy  CaTawana ScalePT, DPT 01/29/2019, 2:07 PM

## 2019-01-29 NOTE — Progress Notes (Signed)
Physical Therapy Session Note  Patient Details  Name: Patrick Pollard MRN: [id number] Date of Birth: 13-Jul-1946  Today's Date: 01/29/2019 PT Individual Time: 0940-1005 PT Individual Time Calculation (min): 25 min   Short Term Goals: Week 4:  PT Short Term Goal 1 (Week 4): Pt will perform slideboard transfers consistently with CGA in both directions PT Short Term Goal 2 (Week 4): Pt will perform sit<>stand with RW and min assist PT Short Term Goal 3 (Week 4): Pt will ambulate mod assist with LRAD x 15 ft  Skilled Therapeutic Interventions/Progress Updates:    Patient in supine, agreeable to PT.  Assist to don TED's and pants in supine with rolling with min A.  Performed supine to sit with mod A and mod cues for technique.  Patient seated EOB donned shoes total A and transferred to w/c mod A scoot-pivot to L with cues.  Sit to stand from w/c mod A to brush teeth after setting up toothbrush.  Standing to brush teeth mod A with sling on L UE and pt leaning L while using R UE to brush teeth.  L LE remained flexed while in standing, but noted able to maintain without buckling.  Patient left seated in w/c with call bell in reach and belt alarm activated.    Therapy Documentation Precautions:  Precautions Precautions: Fall Precaution Comments: left shoulder subluxation Restrictions Weight Bearing Restrictions: No Pain: Pain Assessment Pain Scale: Faces Pain Score: 0-No pain    Therapy/Group: Individual Therapy  Elray Mcgregor  [place], PT 01/29/2019, 1:25 PM

## 2019-01-29 NOTE — Progress Notes (Signed)
Physical Therapy Session Note  Patient Details  Name: Patrick Pollard MRN: [id number] Date of Birth: 1946-06-18  Today's Date: 01/29/2019 PT Individual Time: 1445-1600 PT Individual Time Calculation (min): 75 min   Short Term Goals: Week 4:  PT Short Term Goal 1 (Week 4): Pt will perform slideboard transfers consistently with CGA in both directions PT Short Term Goal 2 (Week 4): Pt will perform sit<>stand with RW and min assist PT Short Term Goal 3 (Week 4): Pt will ambulate mod assist with LRAD x 15 ft  Skilled Therapeutic Interventions/Progress Updates:    Upgraded STG's due to change in length of stay and plan of care. Pt  Supine in bed upon PT arrival, agreeable to therapy tx and denies pain. Therapist donned L shoe and L AFO this session. Pt transferred to sitting EOB with mod assist and performed slideboard transfer to w/c with min assist, cues for techniques. Pt performed slideboard transfer to the mat with min assist. Pt performed sit<>stands x 8 throughout session with mod assist without AD and cues for techniques. While in standing with R UE support on w/c pt worked on L hip flexor activation and swing phases to perform cone kicking activity x 2 trials with mod assist for standing balance. Pt worked on static standing balance and L quad activation without UE support, min-mod assist for static standing balance, mirror for visual feedback. Pt worked on L LE stance control and weightbearing while performing R LE stepping in place without AD, mod assist and cues for techniques, x 2 trials. Pt transferred to supine on mat with mod assist. Pt performed L LE neuro re-ed exercises as follows: 2 x 10 bridges with manual facilitation for increased L LE weightbearing, gravity eliminated L hip and knee AROM/AAROM using powder board- hip flexion and knee extension. Pt transferred to sitting and performed squat pivot to w/c towards R with min assist, cues for techniques. Pt transported back to room and  left seated in w/c with needs in reach and chair alarm set.   Therapy Documentation Precautions:  Precautions Precautions: Fall Precaution Comments: left shoulder subluxation Restrictions Weight Bearing Restrictions: No    Therapy/Group: Individual Therapy  Cresenciano Genre, PT, DPT 01/29/2019, 12:47 PM

## 2019-01-29 NOTE — Plan of Care (Signed)
  Problem: Consults Goal: RH STROKE PATIENT EDUCATION Description See Patient Education module for education specifics  Outcome: Progressing   Problem: RH BOWEL ELIMINATION Goal: RH STG MANAGE BOWEL W/MEDICATION W/ASSISTANCE Description STG Manage Bowel with Medication with mod I Assistance.  Outcome: Progressing   Problem: RH SAFETY Goal: RH STG ADHERE TO SAFETY PRECAUTIONS W/ASSISTANCE/DEVICE Description STG Adhere to Safety Precautions With supervision Assistance/Device.  Outcome: Progressing   Problem: RH KNOWLEDGE DEFICIT Goal: RH STG INCREASE KNOWLEDGE OF HYPERTENSION Description Patient/caregiver will verbalize understanding of HTN management including medications, monitoring, follow up visits, and diet with min assist.  Outcome: Progressing Goal: RH STG INCREASE KNOWLEGDE OF HYPERLIPIDEMIA Description Patient/caregiver will verbalize understanding of HLD management including medications, monitoring, follow up visits, and diet with min assist.  Outcome: Progressing Goal: RH STG INCREASE KNOWLEDGE OF STROKE PROPHYLAXIS Description Patient/caregiver will verbalize understanding of stroke prophylaxis management including medications, monitoring, follow up visits, and diet with min assist.  Outcome: Progressing

## 2019-01-30 ENCOUNTER — Inpatient Hospital Stay (HOSPITAL_COMMUNITY): Payer: Self-pay | Admitting: Occupational Therapy

## 2019-01-30 ENCOUNTER — Inpatient Hospital Stay (HOSPITAL_COMMUNITY): Payer: Self-pay

## 2019-01-30 LAB — GLUCOSE, CAPILLARY
Glucose-Capillary: 149 mg/dL — ABNORMAL HIGH (ref 70–99)
Glucose-Capillary: 157 mg/dL — ABNORMAL HIGH (ref 70–99)
Glucose-Capillary: 142 mg/dL — ABNORMAL HIGH (ref 70–99)
Glucose-Capillary: 128 mg/dL — ABNORMAL HIGH (ref 70–99)
Glucose-Capillary: 118 mg/dL — ABNORMAL HIGH (ref 70–99)

## 2019-01-30 MED ORDER — ARIPIPRAZOLE 5 MG PO TABS
7.5000 mg | ORAL_TABLET | Freq: Every day | ORAL | Status: DC
  Administered 2019-01-31 – 2019-02-07 (×8): 7.5 mg via ORAL
  Filled 2019-01-30 (×8): qty 2

## 2019-01-30 NOTE — Progress Notes (Signed)
[place] PHYSICAL MEDICINE & REHABILITATION PROGRESS NOTE   Subjective/Complaints:  Amb in hall with PT, "3 musketeers style" used left AFO  ROS: Patient denies CP, SOB, N/V/D   Objective:   No results found. No results for input(s): WBC, HGB, HCT, PLT in the last 72 hours. No results for input(s): NA, K, CL, CO2, GLUCOSE, BUN, CREATININE, CALCIUM in the last 72 hours.  Intake/Output Summary (Last 24 hours) at 01/30/2019 0927 Last data filed at 01/30/2019 0700 Gross per 24 hour  Intake 684 ml  Output 1250 ml  Net -566 ml     Physical Exam: Vital Signs Blood pressure 116/90, pulse 95, temperature 98.3 F (36.8 C), resp. rate 19, height _0  (1.854 m), weight 96.2 kg, SpO2 95 %. Constitutional: No distress . Vital signs reviewed. HEENT: EOMI, oral membranes moist Neck: supple Cardiovascular: RRR without murmur. No JVD    Respiratory: CTA Bilaterally without wheezes or rales. Normal effort    GI: BS +, non-tender, non-distended  Musc: No edema or tenderness in extremities. Neuro: Alert Dysarthria Motor:  3- Left upper extremity:handgrip, 3- biceps and 2- triceps  3-/5, Left lower extremity: 1/5 hip flexion, 2/5 distally plantar flexion--no ADF Sensation absent to LT and proprioception  Skin:intact Psych: anxious  Assessment/Plan: 1. 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  Physiatrist is providing close team supervision and 24 hour management of active medical problems listed below.  Physiatrist and rehab team continue to assess barriers to discharge/monitor patient progress toward functional and medical goals  Care Tool:  Bathing    Body parts bathed by patient: Left arm, Chest, Abdomen, Face, Left upper leg, Right upper leg, Right lower leg, Left lower leg, Front perineal area   Body parts bathed by helper: Right arm, Buttocks Body parts n/a:  Left lower leg   Bathing assist Assist Level: Moderate Assistance - Patient 50 - 74%     Upper Body Dressing/Undressing Upper body dressing   What is the patient wearing?: Pull over shirt    Upper body assist Assist Level: Moderate Assistance - Patient 50 - 74%    Lower Body Dressing/Undressing Lower body dressing      What is the patient wearing?: Pants, Incontinence brief     Lower body assist Assist for lower body dressing: Maximal Assistance - Patient 25 - 49%     Toileting Toileting    Toileting assist Assist for toileting: Maximal Assistance - Patient 25 - 49%     Transfers Chair/bed transfer  Transfers assist  Chair/bed transfer activity did not occur: Safety/medical concerns  Chair/bed transfer assist level: Minimal Assistance - Patient > 75%     Locomotion Ambulation   Ambulation assist   Ambulation activity did not occur: Safety/medical concerns  Assist level: Maximal Assistance - Patient 25 - 49% Assistive device: Walker-rolling Max distance: 106f   Walk 10 feet activity   Assist  Walk 10 feet activity did not occur: Safety/medical concerns  Assist level: Maximal Assistance - Patient 25 - 49% Assistive device: Walker-rolling   Walk 50 feet activity   Assist Walk 50 feet with 2 turns activity did not occur: Safety/medical concerns         Walk 150 feet activity   Assist Walk 150 feet activity did not occur: Safety/medical concerns         Walk 10 feet on uneven surface  activity   Assist Walk 10 feet  on uneven surfaces activity did not occur: Safety/medical concerns         Wheelchair     Assist Will patient use wheelchair at discharge?: Yes Type of Wheelchair: Manual Wheelchair activity did not occur: Safety/medical concerns  Wheelchair assist level: Supervision/Verbal cueing Max wheelchair distance: 89'    Wheelchair 50 feet with 2 turns activity    Assist    Wheelchair 50 feet with 2 turns activity  did not occur: Safety/medical concerns   Assist Level: Supervision/Verbal cueing   Wheelchair 150 feet activity     Assist Wheelchair 150 feet activity did not occur: Safety/medical concerns   Assist Level: Minimal Assistance - Patient > 75%    Medical Problem List and Plan: 1.Left side weakness with facial droopas well as left hemi-sensory losssecondary to right thalamic hemorrhage with IVH secondary to hypertensive crisis  Continue CIR, PT, OT, SLP Team conference today please see physician documentation under team conference tab, met with team face-to-face to discuss problems,progress, and goals. Formulized individual treatment plan based on medical history, underlying problem and comorbidities.   2. Antithrombotics: -DVT/anticoagulation:SCDs -antiplatelet therapy: N/A 3. Pain Management:Tylenol as needed 4. Mood:Abilify 5 mg daily, Effexor 300 mg daily. Provide emotional support appreciate psychiatry note -antipsychotic agents: Abilify  Major depressive disorder 5. Neuropsych: This patientiscapable of making decisions on hisown behalf. 6. Skin/Wound Care:Routine skin checks 7. Fluids/Electrolytes/Nutrition:Routine ins and outs   Poor p.o. intake- Remeron started on 4/3 --intake excellent   8.  Essential hypertension  Cont Lisinopril 20 mg daily,   Maxide 37.5-25 mg daily DC'd on 4/4 due to hyponatremia  Monitor with increased mobility Vitals:   01/29/19 2029 01/30/19 0553  BP: 118/77 116/90  Pulse: 96 95  Resp: 18 19  Temp: 98 F (36.7 C) 98.3 F (36.8 C)  SpO2: 95% 95%   Amlodipine increased to 3m on 3/29  Controlled on 4/22 9. BPH. Flomax 0.4 mg daily, removed  Foley  10. History of GI bleed . Continue Protonix 11. Hyperlipidemia. Resume Lipitor on discharge. 12.  Constipation-bowel meds increased on 3/30, again on 3/31, again on 4/1     13.  Prediabetes  Diet changed to carb modified   SSI ordered on 4/2, will  likely not need at home  Pt states that he is receiving concentrated sweets and assumed that these would be automatically restricted based on carb modified diet, ask dietician to address   CBG (last 3)  Recent Labs    01/29/19 1634 01/29/19 2120 01/30/19 0614  GLUCAP 130* 149* 157*  increasing add low dose metformin 2546mBID started 4/22 14.  Hyponatremia  Sodium 126 on 4/4, 129 on 4/6, asymptomatic  Improved to 131 4/10  Continue to monitor 15.  Sleep disturbance-   remeron   1528m - ritalin 59m60mn am to help with am somnolence,  ECG on 3/24     trazodone 50 started on 3/30, DC'd on 4/3   Sleep chart ordered  -consider low dose seroquel for sleep 16. Renal:   -Improving416.  AKI  Creatinine 0.88 on 4/4  Off IVF  Encourage fluids    17.  Leukocytosis- WBCs 12.5 on 4/3, 13.8 on 4/6, remains afebrile     Continue to monitor 18.  Bladder issues - urgency likely multifactorial  CVA Meds , prostate,   -stopped urecholine, seems to be emptying well now but still has CVA related urgency, would avoid anticholinergics, may consider Myrbetriq (B3 adrenergic agonist)   LOS: 26 days  A FACE TO FACE EVALUATION WAS PERFORMED  Charlett Blake 01/30/2019, 9:27 AM

## 2019-01-30 NOTE — Patient Care Conference (Signed)
Inpatient RehabilitationTeam Conference and Plan of Care Update Date: 01/30/2019   Time: 11:30 AM    Patient Name: Patrick Pollard      Medical Record Number: [id number]  Date of Birth: January 05, 1946 Sex: Male         Room/Bed: 4W19C/4W19C-01 Payor Info: Payor: [employer] MEDICARE / Plan: [facility] MEDICARE / Product Type: *No Product type* /    Admitting Diagnosis: ICH  Admit Date/Time:  01/04/2019  3:36 PM Admission Comments: No comment available   Primary Diagnosis:  ICH (intracerebral hemorrhage) (HCC) Principal Problem: ICH (intracerebral hemorrhage) (HCC)  Patient Active Problem List   Diagnosis Date Noted  . 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 Leukocytosis   . MDD (major depressive disorder), single episode, moderate (HCC)   . AKI (acute kidney injury) (HCC)   . Colonic mass   . Severe episode of recurrent major depressive disorder, without psychotic features (HCC)   . Sleep disturbance   . Hyponatremia   . Prediabetes   . Slow transit constipation   . Essential hypertension   . Labile blood pressure   . Hypertensive emergency 01/04/2019  . Hyperlipemia 01/04/2019  . BPH (benign prostatic hyperplasia) 01/04/2019  . Thyroid mass, left 01/04/2019  . Left-sided intracerebral hemorrhage (HCC) 01/04/2019  . 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 Chronic post-traumatic stress disorder (PTSD) 12/25/2018  . MDD (major depressive disorder), recurrent episode, moderate (HCC) 12/04/2018  . GIB (gastrointestinal bleeding) 12/31/2017    Expected Discharge Date: Expected Discharge Date: 02/07/19  Team Members Present: Physician leading conference: Dr. Claudette Laws Social Worker Present: Staci Acosta, LCSW Nurse Present: Gerre Couch, LPN PT Present: Woodfin Ganja, PT OT Present: Perrin Maltese, OT SLP Present: Colin Benton, SLP PPS Coordinator present :  Fae Pippin     Current Status/Progress Goal Weekly Team Focus  Medical   starting to take steps with max A x 2 , sleep improving     adjust psych    Bowel/Bladder   LBM 01/28/19 refuse Mialax cont stool softener, continent of Urinal noted periods of urgency Continue Flomax at HS  remain continent of b/b; gain regualr bowel pattern  assess and adress toileting needin a timely mannser and prn    Swallow/Nutrition/ Hydration             ADL's   Supervision for sitting balance statically with min to mod for dynamic sitting balance.  UB bathing at min assist level with LB bathing at max assist sit to stand.  UB dressing is at a mod assist level with LB dressing at max assist.  Good left hand movement is present with gross digit flexion and 85% extension.  He is able to oppose the thumb to all digits as well.  Brunnstrum stage II-III in the elbow and arm however.  transfers are max assist stand pivot or mod assist squat pivot.  Min to mod for sit to stand transitions  min to mod assist overall  neuromuscular re-education, balance retraining, selfcare retraining, neuromuscular re-education, pt/family education   Mobility   min-mod assist bed mobility, min assist slideboard transfers, supervision w/c propulsion, gait 10 ft with RW max assit +2  min assist bed mobility, mod assist transfers, mod assist ambulating with LRAD  standing balance, gait, transfers, L NMR and education   Communication             Safety/Cognition/ Behavioral Observations  Min -Supervision A  Supervision A  complex problme solving, alternating attention and complex recall   Pain   Tylenol po continue rating pain _8-9/10  pain level <2/10  QS and prn assessment and reassess within alloted time frame, address with medical team if no relief verbalized to meet standard threshold   Skin   Ecchymotic areas to hand improving no discomfort, MASD -groin area improving   remain free of new skin breakdown/infection   QS and prn  assessment and monitoring     Rehab Goals Patient on target to meet rehab goals: Yes Rehab Goals Revised: some PT goals are were upgraded to min A; extra time on CIR allows pt time to reach set mod A goals *See Care Plan and progress notes for long and short-term goals.     Barriers to Discharge  Current Status/Progress Possible Resolutions Date Resolved   Physician    Neurogenic Bowel & Bladder;Medical stability     excellent progress , mood improving   Will extend stay to upgrade, will get orthotics for LLE      Nursing                  PT                    OT                  SLP                SW                Discharge Planning/Teaching Needs:  Pt to return to his home where his wife and dtr will provide necessary care.  Pt's dtr to come for family education closer to d/c.   Team Discussion:  Pt is doing better overall, mood is improving, sleep is better, and pt is more alert in the morning and so pt is able to participate in therapy better and function can improve.  Pt talked to MD and RN about increasing his abilify, so Dr. Wynn Banker will look into this.  Pt is continent and no longer needing caths.  He is concerned about his diet and CSW had made nutrition consult last week and MD made one again today.  Pt is max A for txs with OT and mod A for sit to stand.  He is min A for UB bathing and dressing and mod A LB bathing, max A LB dressing.  Pt's hand function is good and he is getting wrist movement.  Pt gets distracted still.  Pt has some shoulder pain, so 1/2 lap tray and sling added.  Pt with min/mod A OT goals.  Pt is min A for slide board txs and mod A sit to stand with PT.  Orthotist came for brace and pt walking 10' with max A and mod A at rail.  Pt's sit to stand goal upgraded to min A.  Pt is min A/S for complex problem solving, attention, executive functioning and memory recall.  Revisions to Treatment Plan:  none    Continued Need for Acute Rehabilitation Level of  Care: The patient requires daily medical management by a physician with specialized training in physical medicine and rehabilitation for the following conditions: Daily direction of a multidisciplinary physical rehabilitation program to ensure safe treatment while eliciting the highest outcome that is of practical value to the patient.: Yes Daily medical management of patient stability for increased activity during participation in an intensive rehabilitation regime.:  Yes Daily analysis of laboratory values and/or radiology reports with any subsequent need for medication adjustment of medical intervention for : Neurological problems;Blood pressure problems   I attest that I was present, lead the team conference, and concur with the assessment and plan of the team.Team conference was held via web/ teleconference due to COVID - 19.   Odel Schmid, Vista DeckJennifer Capps 01/30/2019, 11:54 AM

## 2019-01-30 NOTE — Progress Notes (Signed)
Physical Therapy Session Note  Patient Details  Name: Patrick Pollard MRN: [id number] Date of Birth: 19-Apr-1946  Today's Date: 01/30/2019 PT Individual Time: 4917-9150 PT Individual Time Calculation (min): 30 min   Short Term Goals: Week 4:  PT Short Term Goal 1 (Week 4): Pt will perform slideboard transfers consistently with CGA in both directions PT Short Term Goal 2 (Week 4): Pt will perform sit<>stand with RW and min assist PT Short Term Goal 3 (Week 4): Pt will ambulate mod assist with LRAD x 15 ft  Skilled Therapeutic Interventions/Progress Updates:     Patient in bed upon PT arrival. Patient alert, eager to start therapy, and agreeable to PT session. B TED hose were donned at beginning of session.  Therapeutic Activity: Bed Mobility: Patient performed supine to sit with min a for trunk support from a flat bed with use of the bed rail. Provided verbal cues for pushing through R UE to sit up. Transfers: Patient performed sit to/from stand x1 with mod A of 2 with B HHA. Provided verbal cues for leaning forward and pushing through B LE's. Performed squat pivot transfer from the bed to the w/c with mod A of 1 with cues for hand positioning.  Gait Training:  Patient ambulated 20 feet using B HHA with patient's arms resting on therapist's shoulders with L AFO and B tennis shoes. Ambulated with step-to with intermittent step through gait pattern leading with the L with manual assist for L foot advancement, increased knee flexion on L in stance with intermittent buckling, therapist blocked L knee in stance, and decreased weight shift to the R. Provided verbal cues for increased step length, R weight shift, L knee extension ins stance, and erect posture.  Patient in w/c at end of session with breaks locked, seat belt alarm set, L UE tray in place, and all needs within reach. Educated patient about energy conservation techniques at home during session.   Therapy Documentation Precautions:   Precautions Precautions: Fall Precaution Comments: left shoulder subluxation Restrictions Weight Bearing Restrictions: No Pain: Pain Assessment Pain Scale: 0-10 Pain Score: 0-No pain    Therapy/Group: Individual Therapy  Helayne Seminole, PT, DPT 01/30/2019, 1:05 PM

## 2019-01-30 NOTE — Progress Notes (Signed)
Appears to resting quietly and comfortable throughout shift, verbalized discomfort of indigestion po liquids provided. Sleep chart continues. Monitor and assisted, call bell within reach and bed alarm in place.

## 2019-01-30 NOTE — Progress Notes (Addendum)
Nutrition Education Note  RD consulted by MD regarding "pt request guidance to select low carb options."  RD working remotely.  Lab Results  Component Value Date   HGBA1C 5.8 (H) 01/03/2019   Pt's hemoglobin A1C is at the lowest end of what is considered the "pre-diabetic" range.  Spoke with pt, wife, and daughter at length (>30 minutes) on Monday, 01/28/19. RD was present in room with pt and wife and daughter were on speaker phone via pt's personal cell phone. At that time, RD provided education on carbohydrates and blood sugar management per pt and family's request. Also provided education regarding low-sodium nutrition therapy and answered all of family's questions.  On 01/28/19, discussed different food groups and their effects on blood sugar, emphasizing carbohydrate-containing foods. Pt and family able to name carbohydrate-containing foods. On that date, pt with questions about desserts and sugar-sweetened beverages. Educated pt to limit sugar-sweetened beverages and to choose water most often. Pt also with questions at that time regarding desserts and asking "how many cookies during the day are too many?" RD discussed the principles of moderation as it relates to higher sugar foods and pt and family expressed understanding.  On that date, further discussed importance of controlled and consistent carbohydrate intake throughout the day. Provided examples of ways to balance meals/snacks and encouraged intake of high-fiber, whole grain complex carbohydrates.  RD called pt's room today (4/22) to provide any additional education that he requested and to answer all questions. Pt states that he talked to the doctor about getting desserts and juice on his meal trays and that "I figured that wasn't too good because I am on insulin and Metformin." RD encouraged pt to be aware of such items and confirmed pt is on a carbohydrate-modified diet. Discussed carbohydrate-modified diet parameters and reiterated  that all people require carbohydrates to function and that pt just needs to be aware of portion sizes and to limit higher sugar items.  Pt states, "I think I got it all sorted out" and "I don't think I'll eat any of that stuff for the rest of the week." Pt denies having any additional questions for RD.  Expect good compliance.  Body mass index is 27.98 kg/m. Pt meets criteria for overweight based on current BMI.  Current diet order is Carbohydrate Modified, patient is consuming approximately 75% of meals at this time. Labs and medications reviewed. No further nutrition interventions warranted at this time. RD contact information provided to family. Handout mailed to family on 01/28/19. If additional nutrition issues arise, please re-consult RD.   Earma Reading, MS, RD, LDN Inpatient Clinical Dietitian Pager: [phone] Weekend/After Hours: [phone]

## 2019-01-30 NOTE — Progress Notes (Signed)
Occupational Therapy Session Note  Patient Details  Name: [name] MRN: [id number] Date of Birth: 1946-04-06  Today's Date: 01/30/2019 OT Individual Time: 1004-1102 OT Individual Time Calculation (min): 58 min    Short Term Goals: Week 4:  OT Short Term Goal 1 (Week 4): Continue working on established LTGs set at min to mod assist for discharge.   Skilled Therapeutic Interventions/Progress Updates:    Pt completed shaving from sitting position in the wheelchair.  Mod assist with max instructional cueing to maintain upright sitting in the wheelchair during task.  Decreased selective attention, he is only able to maintain balance for 10 seconds or so before falling back into the wheelchair or demonstrating slight left lean.  He was able to complete shaving task with overall supervision aside from the balance issues.  He was then able to complete UB bathing and wash his face with overall min assist.  Max hand over hand to use the LUE to wash the right arm.  Min assist with mod demonstrational cueing for hemidressing techniques to complete donning pullover shirt.  Finished session with stand pivot transfer from the wheelchair to the bed with max assist stand pivot.  Mod assist for transfer from sit to supine to finish session.  Call button and phone in reach with bed alarm in place.    Therapy Documentation Precautions:  Precautions Precautions: Fall Precaution Comments: left shoulder subluxation Restrictions Weight Bearing Restrictions: No   Pain: Pain Assessment Pain Scale: Faces Pain Score: 0-No pain Pain Type: Acute pain Pain Location: Back Pain Orientation: Lower Pain Descriptors / Indicators: Discomfort Pain Onset: On-going Pain Intervention(s): Repositioned ADL: See Care Tool Section for some details of ADLs  Therapy/Group: Individual Therapy  Bani Gianfrancesco OTR/L 01/30/2019, 11:30 AM

## 2019-01-30 NOTE — Progress Notes (Signed)
Speech Language Pathology Daily Session Note  Patient Details  Name: Manual Broas MRN: [id number] Date of Birth: 03-25-1946  Today's Date: 01/30/2019 SLP Individual Time: 1137-1200 SLP Individual Time Calculation (min): 23 min  Short Term Goals: Week 4: SLP Short Term Goal 1 (Week 4): STGs=LTGs d/t remaining length of stay  Skilled Therapeutic Interventions: Skilled ST services focused on cognitive skills. SLP facilitated complex problem solving skills utilizing meal panning task, pt demonstrated mod I. Pt required Mod A verbal cues for alternating attention, likely due to internal distractions. Pt was left in room with call bell within reach and bed alarm set. ST recommends to continue skilled ST services.      Pain Pain Assessment Pain Scale: Faces Pain Score: 0-No pain Pain Type: Acute pain Pain Location: Back Pain Orientation: Lower Pain Descriptors / Indicators: Discomfort Pain Onset: On-going Pain Intervention(s): Repositioned  Therapy/Group: Individual Therapy  Dinna Severs  [facility] 01/30/2019, 12:17 PM

## 2019-01-30 NOTE — Progress Notes (Signed)
Physical Therapy Session Note  Patient Details  Name: Patrick LevyBenjamin Pollard MRN: [id number] Date of Birth: [birth year]  Today's Date: 01/30/2019 PT Individual Time: 1330-1415 and 1600-1700 PT Individual Time Calculation (min): 45 min and 60 min    Short Term Goals: Week 4:  PT Short Term Goal 1 (Week 4): Pt will perform slideboard transfers consistently with CGA in both directions PT Short Term Goal 2 (Week 4): Pt will perform sit<>stand with RW and min assist PT Short Term Goal 3 (Week 4): Pt will ambulate mod assist with LRAD x 15 ft  Skilled Therapeutic Interventions/Progress Updates:    Session 1: Pt supine in bed upon PT arrival, agreeable to therapy tx and denies pain. Pt transferred to sitting EOB with min assist for L LE management and pt using bedrails. Pt performed squat pivot into w/c with mod assist to R. Pt transported to the gym. Pt ambulated x 30 ft at the rail with mod assist, pt advancing L LE during swing, therapist assisting to block L knee during stance, pt wearing L GRAFO. Pt performed x 2 slideboard transfers in each direction with cues for techniques, min assist fading to CGA. Pt performed unlevel transfer to elevated mat 25 inches with min assist and slideboard lateral scoot. Pt ambulated x 10 ft this session with RW and max assist +2 for w/c follow, therapist blocking L LE and providing cues for sequencing. Pt performed x 5 sit<>stands from mat without AD with mod assist, therapist facilitating increased L LE weightbearing for neuro re-ed and strengthening. Pt worked on L LE stance control and weightbearing while performing x 4 steps in place with R LE, no AD. Pt transferred back to w/c and left in w/c in room at end of session with needs in reach and chair alarm set.   Session 2: Pt supine in bed upon PT arrival, agreeable to therapy tx and denies pain. Pt transferred to sitting EOB with mod assist and verbal cues for techniques. Pt performed slideboard transfer with set up assist  for board placement, otherwise CGA. Pt transported to the gym and performed slideboard transfer to the mat with CGA. Pt worked on static standing balance this session with emphasis on L quad activation and midline orientation, mirror for visual feedback. Pt worked on L LE stance control and weightbearing while performing x 4 steps in place with R LE, no AD and mod assist. Pt worked on L LE stance control and weightbearing with use of RW while performing R LE stepping in place 2 x 10 and then R LE toe taps to 4 inch step 2 x 10 taps with min-mod assist for balance and mirror for visual feedback, therapist providing manual facilitation for L glute activation and hip extension & blocking L knee. Pt transferred to supine on mat with mod assist. Pt performed L LE neuro re-ed exercises 2 x 10 of each: bridges with R LE more extended for increased use of L LE, L LE PNF D2 extension against manual resistance and L quad sets, all with cues for techniques. Pt transferred to sitting with min assist. Transferred to w/c with CGA and cues for techniques, lateral scoot with slideboard. Pt ambulated x 30 ft at the rail with mod assist for L knee blocking during stance, Pt wearing L AFO and sling, therapist providing manual facilitation for L hip extension during stance, verbal cues for upright posture and verbal cues for increased R step length. Pt transported back to room and left seated with needs  in reach and chair alarm set.   Therapy Documentation Precautions:  Precautions Precautions: Fall Precaution Comments: left shoulder subluxation Restrictions Weight Bearing Restrictions: No    Therapy/Group: Individual Therapy  Cresenciano Genre, PT, DPT 01/30/2019, 7:55 AM

## 2019-01-31 ENCOUNTER — Inpatient Hospital Stay (HOSPITAL_COMMUNITY): Payer: Self-pay | Admitting: Physical Therapy

## 2019-01-31 ENCOUNTER — Inpatient Hospital Stay (HOSPITAL_COMMUNITY): Payer: Self-pay | Admitting: Occupational Therapy

## 2019-01-31 ENCOUNTER — Inpatient Hospital Stay (HOSPITAL_COMMUNITY): Payer: Self-pay | Admitting: Speech Pathology

## 2019-01-31 LAB — GLUCOSE, CAPILLARY
Glucose-Capillary: 152 mg/dL — ABNORMAL HIGH (ref 70–99)
Glucose-Capillary: 125 mg/dL — ABNORMAL HIGH (ref 70–99)
Glucose-Capillary: 121 mg/dL — ABNORMAL HIGH (ref 70–99)

## 2019-01-31 MED ORDER — MELATONIN 3 MG PO TABS
3.0000 mg | ORAL_TABLET | Freq: Every day | ORAL | Status: DC
  Administered 2019-01-31 – 2019-02-06 (×7): 3 mg via ORAL
  Filled 2019-01-31 (×8): qty 1

## 2019-01-31 NOTE — Progress Notes (Signed)
Occupational Therapy Session Note  Patient Details  Name: Patrick Pollard MRN: [id number] Date of Birth: April 17, 1946  Today's Date: 01/31/2019 OT Individual Time: 2395-3202 OT Individual Time Calculation (min): 75 min    Short Term Goals: Week 4:  OT Short Term Goal 1 (Week 4): Continue working on established LTGs set at min to mod assist for discharge.   Skilled Therapeutic Interventions/Progress Updates:    Pt completed transfer from bed to wheelchair squat pivot to the right with max assist.  Once in the wheelchair he was taken down to the therapy gym where he transferred to the mat at the same level.  Slight increased pushing noted with transfers to the right.  When transferring squat pivot to the left, he can complete with min facilitation.  Once in the gym, applied NMES to the left shoulder supraspinatus and middle delt.  He was able to tolerate 20 mins of active stimulation with intensity at 35 for 14 mins and 42 for 6 mins without any adverse reactions.  Custom program with 10 seconds on and 5 seconds off, PPS at 35 and pulse width at 300.  Next had him work on functional reaching across midline to the left side while having the LUE in weightbearing on the mat.  Max facilitation needed at the left elbow when reaching with the RUE.  Finished neuro re-education with use of a tilted stool and pt having to maintain sustained visual attention to the left hand while pushing the stool forward.  He needed mod facilitation to maintain visual attention and push the stool forward while maintaining right digit extension.  Concluded session with return to the room with transfer to the bed with max assist stand pivot.  Call button and phone in reach with bed alarm in place.    Therapy Documentation Precautions:  Precautions Precautions: Fall Precaution Comments: left shoulder subluxation Restrictions Weight Bearing Restrictions: No  Pain: Pain Assessment Pain Scale: Faces Faces Pain Scale: No  hurt ADL:  Therapy/Group: Individual Therapy  Irmalee Riemenschneider OTR/L 01/31/2019, 4:12 PM

## 2019-01-31 NOTE — Progress Notes (Signed)
Rec'd pt herbal medications from home. Verified with Dr Wynn Banker that okay to take one each at hs. Called [facility] to get on MAR to admin this HS. Said that current policy doesn't allow and will send clinical staff up to discuss with pt and go from there. Putting medications in pt bin at this time. Melatonin and Valerian Root.

## 2019-01-31 NOTE — Progress Notes (Signed)
[facility] PHYSICAL MEDICINE & REHABILITATION PROGRESS NOTE   Subjective/Complaints:  Patient states that although he sleeps he does not feel well rested in the morning.  He asked about a herbal supplement and we looked at the ingredients.  It does contain tryptophan and he is already on 3 medications that elevate serotonin levels therefore I stated that it was not advisable to take this herbal supplement.  Other ingredients of this supplement included melatonin chamomile and valerian which would be compatible with his medication.  ROS: Patient denies CP, SOB, N/V/D   Objective:   No results found. No results for input(s): WBC, HGB, HCT, PLT in the last 72 hours. No results for input(s): NA, K, CL, CO2, GLUCOSE, BUN, CREATININE, CALCIUM in the last 72 hours.  Intake/Output Summary (Last 24 hours) at 01/31/2019 0942 Last data filed at 01/31/2019 0536 Gross per 24 hour  Intake 720 ml  Output 1200 ml  Net -480 ml     Physical Exam: Vital Signs Blood pressure 119/76, pulse 94, temperature 97.9 F (36.6 C), temperature source Oral, resp. rate 17, height 6\' 1"  (1.854 m), weight 96.2 kg, SpO2 92 %. Constitutional: No distress . Vital signs reviewed. HEENT: EOMI, oral membranes moist Neck: supple Cardiovascular: RRR without murmur. No JVD    Respiratory: CTA Bilaterally without wheezes or rales. Normal effort    GI: BS +, non-tender, non-distended  Musc: No edema or tenderness in extremities. Neuro: Alert Dysarthria Motor:  3- Left upper extremity:handgrip, 3- biceps and 2- triceps  3-/5, Left lower extremity: 1/5 hip flexion, 2/5 distally plantar flexion--no ADF Sensation absent to LT and proprioception  Skin:intact Psych: anxious  Assessment/Plan: 1. 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  Physiatrist is providing close team supervision and 24 hour management  of active medical problems listed below.  Physiatrist and rehab team continue to assess barriers to discharge/monitor patient progress toward functional and medical goals  Care Tool:  Bathing    Body parts bathed by patient: Left arm, Chest, Abdomen, Face   Body parts bathed by helper: Right arm, Buttocks Body parts n/a: Left upper leg, Right upper leg, Right lower leg, Left lower leg, Front perineal area, Buttocks   Bathing assist Assist Level: Supervision/Verbal cueing     Upper Body Dressing/Undressing Upper body dressing   What is the patient wearing?: Pull over shirt    Upper body assist Assist Level: Minimal Assistance - Patient > 75%    Lower Body Dressing/Undressing Lower body dressing      What is the patient wearing?: Pants, Incontinence brief     Lower body assist Assist for lower body dressing: Maximal Assistance - Patient 25 - 49%     Toileting Toileting    Toileting assist Assist for toileting: Maximal Assistance - Patient 25 - 49%     Transfers Chair/bed transfer  Transfers assist  Chair/bed transfer activity did not occur: Safety/medical concerns  Chair/bed transfer assist level: Contact Guard/Touching assist(slideboard)     Locomotion Ambulation   Ambulation assist   Ambulation activity did not occur: Safety/medical concerns  Assist level: Moderate Assistance - Patient 50 - 74% Assistive device: Other (comment)(rail in hallway) Max distance: 30 ft   Walk 10 feet activity   Assist  Walk 10 feet activity did not occur: Safety/medical concerns  Assist level: Moderate Assistance - Patient - 50 - 74% Assistive device: Other (comment)(rail in hallway)   Walk  50 feet activity   Assist Walk 50 feet with 2 turns activity did not occur: Safety/medical concerns         Walk 150 feet activity   Assist Walk 150 feet activity did not occur: Safety/medical concerns         Walk 10 feet on uneven surface  activity   Assist  Walk 10 feet on uneven surfaces activity did not occur: Safety/medical concerns         Wheelchair     Assist Will patient use wheelchair at discharge?: Yes Type of Wheelchair: Manual Wheelchair activity did not occur: Safety/medical concerns  Wheelchair assist level: Supervision/Verbal cueing Max wheelchair distance: 59'    Wheelchair 50 feet with 2 turns activity    Assist    Wheelchair 50 feet with 2 turns activity did not occur: Safety/medical concerns   Assist Level: Supervision/Verbal cueing   Wheelchair 150 feet activity     Assist Wheelchair 150 feet activity did not occur: Safety/medical concerns   Assist Level: Minimal Assistance - Patient > 75%    Medical Problem List and Plan: 1.Left side weakness with facial droopas well as left hemi-sensory losssecondary to right thalamic hemorrhage with IVH secondary to hypertensive crisis  Continue CIR, PT, OT, SLP Extended discharge date to 02/07/2019 based on improvements and ability to achieve min assist level which would definitely help his elderly wife  2. Antithrombotics: -DVT/anticoagulation:SCDs -antiplatelet therapy: N/A 3. Pain Management:Tylenol as needed 4. Mood:Abilify 5 mg daily, Effexor 300 mg daily. Provide emotional support appreciate psychiatry note -antipsychotic agents: Abilify  Major depressive disorder 5. Neuropsych: This patientiscapable of making decisions on hisown behalf. 6. Skin/Wound Care:Routine skin checks 7. Fluids/Electrolytes/Nutrition:Routine ins and outs   Poor p.o. intake- Remeron started on 4/3 --intake excellent   8.  Essential hypertension  Cont Lisinopril 20 mg daily,   Maxide 37.5-25 mg daily DC'd on 4/4 due to hyponatremia  Monitor with increased mobility Vitals:   01/30/19 2025 01/31/19 0519  BP: 106/74 119/76  Pulse: 93 94  Resp: 18 17  Temp: (!) 97.3 F (36.3 C) 97.9 F (36.6 C)  SpO2: 96% 92%   Amlodipine  increased to  on 3/29  Controlled on 4/22 9. BPH. Flomax 0.4 mg daily, removed  Foley  10. History of GI bleed . Continue Protonix 11. Hyperlipidemia. Resume Lipitor on discharge. 12.  Constipation-bowel meds increased on 3/30, again on 3/31, again on 4/1     13.  Prediabetes  Diet changed to carb modified   SSI ordered on 4/2, will likely not need at home  Appreciate dietary consult   CBG (last 3)  Recent Labs    01/30/19 1720 01/30/19 2102 01/31/19 0637  GLUCAP 128* 118* 152*  increasing add low dose metformin  BID started 4/22, well controlled 14.  Hyponatremia  Sodium 126 on 4/4, 129 on 4/6, asymptomatic  Improved to 131 4/10  Continue to monitor 15.  Sleep disturbance-   remeron      - ritalin   in am to help with am somnolence, the patient is overall doing better with this however he still is not satisfied with his quality of sleep.  We discussed that his family can bring in either valerian or chamomile this can be either in a pill form or tea ECG on 3/24     trazodone 50 started on 3/30, DC'd on 4/3   Sleep chart ordered  -consider low dose seroquel for sleep 16. Renal:   -Improving416.  AKI  Creatinine 0.88 on 4/4  Off IVF  Encourage fluids    17.  Leukocytosis- WBCs 12.5 on 4/3, 13.8 on 4/6, remains afebrile     Continue to monitor 18.  Bladder issues - urgency likely multifactorial  CVA Meds , prostate,   -stopped urecholine, seems to be emptying well now but still has CVA related urgency, would avoid anticholinergics, may consider Myrbetriq (B3 adrenergic agonist)   LOS: 27 days A FACE TO FACE EVALUATION WAS PERFORMED  Erick Colace 01/31/2019, 9:42 AM

## 2019-01-31 NOTE — Progress Notes (Signed)
Physical Therapy Session Note  Patient Details  Name: Patrick Pollard MRN: [id number] Date of Birth: 08-May-1946  Today's Date: 01/31/2019 PT Individual Time: 1032-1131 PT Individual Time Calculation (min): 59 min   Short Term Goals: Week 3:  PT Short Term Goal 1 (Week 3): Pt will perform supine<>sit with no more than min assist of 1 PT Short Term Goal 1 - Progress (Week 3): Not met PT Short Term Goal 2 (Week 3): Pt will perform bed<>chair transfer using LRAD with no more than min assist of 1 PT Short Term Goal 2 - Progress (Week 3): Met PT Short Term Goal 3 (Week 3): Pt will perform sit<>stand using LRAD with no more than mod assist of 1 PT Short Term Goal 3 - Progress (Week 3): Met PT Short Term Goal 4 (Week 3): Pt will ambulate at least 47f using LRAD with no more than max assist of 1 PT Short Term Goal 4 - Progress (Week 3): Met  Skilled Therapeutic Interventions/Progress Updates:    Pt received sitting in w/c and eager to participate with therapy session. Pt transported to/from therapy gym in w/c. Pt not wearing L LE AFO throughout session. Pt reported intermittent L shoulder pain, unrated, when UE placed in certain positions during mobility - repositioning provided pain relief. Pt performed squat pivot transfer w/c<>mat table with min assist for increasing anterior trunk lean and pivoting hips to improved hip clearance during transfer. Pt performed sit<>supine with min assist for trunk control and L hemibody management. Pt performed supine bridging 2 sets of 15 with R LE extended further to increased L LE muscular activation with verbal/tactile cuing for increased L gluteal activation. Pt performed 2sets if ~10 repetitions of R sidelying with L LE on powder board L LE reversals between hip/knee flexion and hip/knee extension to mimic swing and stance phase of gait focusing on neuromuscular re-education for activation of hip flexors and hip/knee extensors - multimodal cuing throughout for  muscular activation and proper form/sequencing. Performed supine L LE heel slides focusing on hip flexor activation in gravity resisted position with pt requiring max assist to lift LE. Pt transferred back into w/c as described above and performed standing to tall kneeling on mat table with heavy +2 max assist for trunk control and  L LE management to achieve tall kneeling position. Pt performed repeated ~6 repetitions of eccentric lowering of hips down to ankles followed by concentric glute activation to return to tall kneeling with max assist of 2 for trunk support and hip movement throughout progressed to mod assist of 2 - max multimodal cuing throughout for sequencing of task, trunk upright posture, and increased hip extension to achieve upright. Pt performed 2 sets of ~5 repetitions of sit<>stands from EOM to R HHA with R LE extended to increase L LE muscular activation - max assist of 1 for lifting and multimodal cuing for increased anterior trunk lean/weight shifting and increased L LE hip/kne extension to achieve upright. Performed 2 bouts of pre-gait L LE neuromuscular re-education of stepping forwards/backwards with R LE focusing on L hip/knee extension muscular activation for stance control with mod/max assist of 1 and multimodal cuing and manual facilitation for improved L LE hip/knee extension. Pt returned to room in w/c and left sitting in w/c with needs in reach and seat belt alarm on.    Therapy Documentation Precautions:  Precautions Precautions: Fall Precaution Comments: left shoulder subluxation Restrictions Weight Bearing Restrictions: No  Therapy/Group: Individual Therapy  CTawana Scale PT, DPT 01/31/2019,  7:47 AM

## 2019-01-31 NOTE — Progress Notes (Signed)
Speech Language Pathology Daily Session Note  Patient Details  Name: Olajide Bargerstock MRN: [id number] Date of Birth: 22-Jun-1946  Today's Date: 01/31/2019 SLP Individual Time: 0800-0830 SLP Individual Time Calculation (min): 30 min  Short Term Goals: Week 4: SLP Short Term Goal 1 (Week 4): STGs=LTGs d/t remaining length of stay  Skilled Therapeutic Interventions: Pt was seen for skilled ST intervention targeting goals for cognition. SLP facilitated session by providing checkbook balancing activity. Pt required mod+ cues to accurately enter debits and credits into the ledger. Mod+ cues were required for correct addition and subtraction. Writing was poorly legible, which exacerbated error making. Pt was unable to complete the entire activity, entering 4/8 (50%) of the entries. Continued ST intervention is recommended. Pt was left in bed with bed alarm set, all needs within reach.   Pain Pain Assessment Pain Scale: Faces Pain Score: 0-No pain Faces Pain Scale: No hurt  Therapy/Group: Individual Therapy   Kimari Lienhard B. Murvin Natal, [facility], CCC-SLP Speech Language Pathologist  Leigh Aurora 01/31/2019, 12:56 PM

## 2019-01-31 NOTE — Progress Notes (Signed)
Occupational Therapy Session Note  Patient Details  Name: Joshiah Schaal MRN: [id number] Date of Birth: [birth year]  Today's Date: 01/31/2019 OT Individual Time: 0321-2248 OT Individual Time Calculation (min): 62 min    Short Term Goals: Week 4:  OT Short Term Goal 1 (Week 4): Continue working on established LTGs set at min to mod assist for discharge.   Skilled Therapeutic Interventions/Progress Updates:    Pt completed transfer from supine to sit EOB with mod assist.  He then completed sit to stand from the bed using the [facility] with mod assist as well.  Assisted pt with placement of LUE on the stedy and he was able to maintain grasp through sit to stand and standing with mod instructional cueing to maintain sustained visual attention to it.  Once transfer was completed, he was able to complete bathing with mod instructional cueing and mod assist sit to stand in the shower.  Had pt use the grab bar on the right side for sit to stand and also encouraged moving his right shoulder to the wall once standing as well.  Max hand over hand for LUE use to wash the right arm.  Stedy used for transfer out of the shower as well to the wheelchair at the sink for dressing.  Mod questioning cueing with max assist for completion of LB dressing following hemi dressing techniques.  He was able to then complete UB dressing to donn a pullover shirt with min assist and max demonstrational cueing for technique to thread his arm through.  Max assist for standing balance when pulling garments over his hips.  Finished session with therapist assist to donn his TEDs, shoes, and left AFO.  Pt left up in the wheelchair with call button and phone in reach and safety alarm belt in place.    Therapy Documentation Precautions:  Precautions Precautions: Fall Precaution Comments: left shoulder subluxation Restrictions Weight Bearing Restrictions: No   Pain: Pain Assessment Pain Scale: Faces Pain Score: 0-No pain Pain Type:  Other (Comment)(premed before therapy) ADL:  See care tool section for some details of ADL  Therapy/Group: Individual Therapy  Roizy Harold OTR/L 01/31/2019, 11:44 AM

## 2019-02-01 ENCOUNTER — Inpatient Hospital Stay (HOSPITAL_COMMUNITY): Payer: Self-pay | Admitting: Physical Therapy

## 2019-02-01 ENCOUNTER — Inpatient Hospital Stay (HOSPITAL_COMMUNITY): Payer: Self-pay | Admitting: Speech Pathology

## 2019-02-01 ENCOUNTER — Inpatient Hospital Stay (HOSPITAL_COMMUNITY): Payer: Self-pay | Admitting: Occupational Therapy

## 2019-02-01 ENCOUNTER — Inpatient Hospital Stay (HOSPITAL_COMMUNITY): Payer: Self-pay

## 2019-02-01 LAB — GLUCOSE, CAPILLARY
Glucose-Capillary: 139 mg/dL — ABNORMAL HIGH (ref 70–99)
Glucose-Capillary: 125 mg/dL — ABNORMAL HIGH (ref 70–99)
Glucose-Capillary: 115 mg/dL — ABNORMAL HIGH (ref 70–99)
Glucose-Capillary: 116 mg/dL — ABNORMAL HIGH (ref 70–99)
Glucose-Capillary: 131 mg/dL — ABNORMAL HIGH (ref 70–99)

## 2019-02-01 NOTE — Progress Notes (Signed)
Social Work Patient ID: Patrick Pollard, male   DOB: 01-21-1946, 73 y.o.   MRN: [id number]   CSW met with pt 01-30-19 to update him on team conference discussion and spoke with pt's dtr and wife multiple times throughout the week.  Pt will practice car tx with his dtr soon and further family education will also occur this coming week.  Pt and family and team were all in agreement to extend pt's stay due to progress he's making and the ability to get pt to an easier level of care for family to manage at home.  CSW will continue to follow and prepare pt for d/c 02-07-19.

## 2019-02-01 NOTE — Progress Notes (Signed)
Physical Therapy Session Note  Patient Details  Name: Patrick Pollard MRN: [id number] Date of Birth: 01-27-1946  Today's Date: 02/01/2019 PT Individual Time: 1204-1240 PT Individual Time Calculation (min): 36 min   Short Term Goals: Week 4:  PT Short Term Goal 1 (Week 4): Pt will perform slideboard transfers consistently with CGA in both directions PT Short Term Goal 2 (Week 4): Pt will perform sit<>stand with RW and min assist PT Short Term Goal 3 (Week 4): Pt will ambulate mod assist with LRAD x 15 ft  Skilled Therapeutic Interventions/Progress Updates:    Pt received supine in bed and agreeable to therapy session. Reports pain in L shoulder - additional details below. Performed supine to sit, HOB partially elevated and bedrails available, with mod assist for trunk upright. Performed many sit<>stands from EOB to no UE support throughout session with mod assist progressed to min assist for 1 transfer - multimodal cuing throughout for increased anterior trunk weight shift and  BLE hip/knee extension as well as anterior weight shift in standing to prevent posterior LOB. Performed many bouts of static standing, wearing L LE AFO but no UE support, with initially requiring max assist for balance progressed from mod assist to CGA for ~20-30 seconds - max multimodal cuing throughout focusing on midline orientation (due to L lateral lean) and increased L LE gluteal and quadriceps activation to achieve full extension. Pt performed sit to supine with max assist for trunk control and B LE management for time management. Pt performed supine L LE short arch quads with isometric hold at top, min assist for lifting foot - performed 1 set of 15 repetitions - pt education on continuing to perform quad sets outside of therapy session focusing on sustained contraction for at least 5 seconds. Pt left supine in bed with needs in reach and bed alarm on.   Therapy Documentation Precautions:  Precautions Precautions:  Fall Precaution Comments: left shoulder subluxation Restrictions Weight Bearing Restrictions: No  Pain:   Reports L shoulder pain at rest - RN aware and present for medication administration. Therapist donned L UE sling during session for pain management.  Therapy/Group: Individual Therapy  Ginny Forth, PT, DPT 02/01/2019, 7:55 AM

## 2019-02-01 NOTE — Progress Notes (Signed)
[facility] PHYSICAL MEDICINE & REHABILITATION PROGRESS NOTE   Subjective/Complaints:  No issues overnite, slept well after drinking camomile tea, took melatonin  ROS: Patient denies CP, SOB, N/V/D   Objective:   No results found. No results for input(s): WBC, HGB, HCT, PLT in the last 72 hours. No results for input(s): NA, K, CL, CO2, GLUCOSE, BUN, CREATININE, CALCIUM in the last 72 hours.  Intake/Output Summary (Last 24 hours) at 02/01/2019 0809 Last data filed at 02/01/2019 0612 Gross per 24 hour  Intake 360 ml  Output 700 ml  Net -340 ml     Physical Exam: Vital Signs Blood pressure 120/84, pulse 86, temperature 98.1 F (36.7 C), temperature source Oral, resp. rate 17, height 6\' 1"  (1.854 m), weight 96.2 kg, SpO2 94 %. Constitutional: No distress . Vital signs reviewed. HEENT: EOMI, oral membranes moist Neck: supple Cardiovascular: RRR without murmur. No JVD    Respiratory: CTA Bilaterally without wheezes or rales. Normal effort    GI: BS +, non-tender, non-distended  Musc: No edema or tenderness in extremities. Neuro: Alert Dysarthria Motor:  3- Left upper extremity:handgrip, 3- biceps and 2- triceps  3-/5, Left lower extremity: 1/5 hip flexion, 2/5 distally plantar flexion--no ADF Sensation absent to LT and proprioception  Skin:intact Psych: anxious  Assessment/Plan: 1. 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  Physiatrist is providing close team supervision and 24 hour management of active medical problems listed below.  Physiatrist and rehab team continue to assess barriers to discharge/monitor patient progress toward functional and medical goals  Care Tool:  Bathing    Body parts bathed by patient: Left arm, Chest, Abdomen, Right upper leg, Left upper leg, Right lower leg, Face   Body parts bathed by helper: Front perineal area, Buttocks,  Left lower leg, Right arm Body parts n/a: Left upper leg, Right upper leg, Right lower leg, Left lower leg, Front perineal area, Buttocks   Bathing assist Assist Level: Moderate Assistance - Patient 50 - 74%     Upper Body Dressing/Undressing Upper body dressing   What is the patient wearing?: Pull over shirt    Upper body assist Assist Level: Minimal Assistance - Patient > 75%    Lower Body Dressing/Undressing Lower body dressing      What is the patient wearing?: Pants, Incontinence brief     Lower body assist Assist for lower body dressing: Maximal Assistance - Patient 25 - 49%     Toileting Toileting    Toileting assist Assist for toileting: Maximal Assistance - Patient 25 - 49%     Transfers Chair/bed transfer  Transfers assist  Chair/bed transfer activity did not occur: Safety/medical concerns  Chair/bed transfer assist level: Minimal Assistance - Patient > 75%(squat pivot)     Locomotion Ambulation   Ambulation assist   Ambulation activity did not occur: Safety/medical concerns  Assist level: Moderate Assistance - Patient 50 - 74% Assistive device: Other (comment)(rail in hallway) Max distance: 30 ft   Walk 10 feet activity   Assist  Walk 10 feet activity did not occur: Safety/medical concerns  Assist level: Moderate Assistance - Patient - 50 - 74% Assistive device: Other (comment)(rail in hallway)   Walk 50 feet activity   Assist Walk 50 feet with 2 turns activity did not occur: Safety/medical concerns         Walk 150 feet activity   Assist Walk 150 feet activity did not occur:  Safety/medical concerns         Walk 10 feet on uneven surface  activity   Assist Walk 10 feet on uneven surfaces activity did not occur: Safety/medical concerns         Wheelchair     Assist Will patient use wheelchair at discharge?: Yes Type of Wheelchair: Manual Wheelchair activity did not occur: Safety/medical concerns  Wheelchair  assist level: Supervision/Verbal cueing Max wheelchair distance: 56'    Wheelchair 50 feet with 2 turns activity    Assist    Wheelchair 50 feet with 2 turns activity did not occur: Safety/medical concerns   Assist Level: Supervision/Verbal cueing   Wheelchair 150 feet activity     Assist Wheelchair 150 feet activity did not occur: Safety/medical concerns   Assist Level: Minimal Assistance - Patient > 75%    Medical Problem List and Plan: 1.Left side weakness with facial droopas well as left hemi-sensory losssecondary to right thalamic hemorrhage with IVH secondary to hypertensive crisis  Continue CIR, PT, OT, SLP Extended discharge date to 02/07/2019 based on improvements and ability to achieve min assist level which would definitely help his elderly wife  2. Antithrombotics: -DVT/anticoagulation:SCDs -antiplatelet therapy: N/A 3. Pain Management:Tylenol as needed 4. Mood:Abilify 5 mg daily, Effexor 300 mg daily. Provide emotional support appreciate psychiatry note -antipsychotic agents: Abilify  Major depressive disorder 5. Neuropsych: This patientiscapable of making decisions on hisown behalf. 6. Skin/Wound Care:Routine skin checks 7. Fluids/Electrolytes/Nutrition:Routine ins and outs   Poor p.o. intake- Remeron started on 4/3 --intake excellent   8.  Essential hypertension  Cont Lisinopril 20 mg daily,   Maxide 37.5-25 mg daily DC'd on 4/4 due to hyponatremia  Monitor with increased mobility Vitals:   01/31/19 2105 02/01/19 0612  BP: 101/76 120/84  Pulse: 97 86  Resp: 17 17  Temp: 98.8 F (37.1 C) 98.1 F (36.7 C)  SpO2: 94% 94%   Amlodipine increased to  on 3/29  Controlled on 4/24 9. BPH. Flomax 0.4 mg daily, removed  Foley  10. History of GI bleed . Continue Protonix 11. Hyperlipidemia. Resume Lipitor on discharge. 12.  Constipation-bowel meds increased on 3/30, again on 3/31, again on 4/1     13.   Prediabetes although CBGs have been consistently elevated post CVA likely due to decreased activity  Diet changed to carb modified   SSI ordered on 4/2, will likely not need at home  Appreciate dietary consult   CBG (last 3)  Recent Labs    01/31/19 1715 01/31/19 2109 02/01/19 0614  GLUCAP 121* 139* 125*  increasing add low dose metformin  BID started 4/22, well controlled 14.  Hyponatremia  Sodium 126 on 4/4, 129 on 4/6, asymptomatic  Improved to 131 4/10  Continue to monitor 15.  Sleep disturbance-   remeron      - ritalin   in am to help with am somnolence, the patient is overall doing better with this however he still is not satisfied with his quality of sleep. Pt took melatonin and drank camomile teaECG on 3/24     trazodone 50 started on 3/30, DC'd on 4/3   Sleep chart ordered  -consider low dose seroquel for sleep 16. Renal:   -Improving416.  AKI  Creatinine 0.88 on 4/4  Off IVF  Encourage fluids    17.  Leukocytosis- WBCs 12.5 on 4/3, 13.8 on 4/6, remains afebrile     Continue to monitor 18.  Bladder issues - urgency likely multifactorial  CVA Meds , prostate,   -  stopped urecholine, seems to be emptying well now but still has CVA related urgency, would avoid anticholinergics, may consider Myrbetriq (B3 adrenergic agonist)   LOS: 28 days A FACE TO FACE EVALUATION WAS PERFORMED  Patrick Pollard 02/01/2019, 8:09 AM

## 2019-02-01 NOTE — Progress Notes (Signed)
Physical Therapy Session Note  Patient Details  Name: Patrick Pollard MRN: [id number] Date of Birth: July 07, 1946  Today's Date: 02/01/2019 PT Individual Time: 4163-8453 PT Individual Time Calculation (min): 40 min   Short Term Goals: Week 4:  PT Short Term Goal 1 (Week 4): Pt will perform slideboard transfers consistently with CGA in both directions PT Short Term Goal 2 (Week 4): Pt will perform sit<>stand with RW and min assist PT Short Term Goal 3 (Week 4): Pt will ambulate mod assist with LRAD x 15 ft  Skilled Therapeutic Interventions/Progress Updates:   Pt received sitting in WC and agreeable to PT. Pt transported to day troom in [place]. Squat pivot transfer to and from nustep with mod assist from PT with cues for improved anterior weight shift and press through LE to lift glutes off seat. nustep reciprocal movement training BUE and BLE x 5 mintues  With hand splint and max assist to maintain neutral hip ER/IR. 5 min BLE only with cues for full ROM and improved symmetry. WC mobility instructed by PT x 13f with hemi technique with intermittent cues for attention to task and improved use of RUE to reduce stress on LE. squt pivot transfer back to bed with min assist on the L. Sit>supine with min assist to control the LLE and LUE. Pt left supine in bed with call bell in reach and all needs met.        Therapy Documentation Precautions:  Precautions Precautions: Fall Precaution Comments: left shoulder subluxation Restrictions Weight Bearing Restrictions: No Vital Signs: Therapy Vitals Temp: 98.8 F (37.1 C) Temp Source: Oral Pulse Rate: 96 Resp: 18 BP: 114/80 Patient Position (if appropriate): Sitting Oxygen Therapy SpO2: 94 % O2 Device: Room Air Pain: denies   Therapy/Group: Individual Therapy  ALorie Phenix4/24/2020, 5:48 PM

## 2019-02-01 NOTE — Progress Notes (Signed)
Occupational Therapy Weekly Progress Note  Patient Details  Name: Patrick Pollard MRN: [id number] Date of Birth: 1946/03/24  Beginning of progress report period: January 26, 2019 End of progress report period: February 01, 2019  Today's Date: 02/01/2019 OT Individual Time: 0901-1003 OT Individual Time Calculation (min): 62 min   Pt is making steady progress with OT at this time but still exhibits extensive impairments in balance, LUE and LLE strength and functional use, awareness, attention, sensation, perception, and problem solving.  He is currently mod assist level for bathing sit to stand in the shower as well as min to mod for UB dressing with mod demonstrational cueing for technique.  He needs max assist for LB dressing sit to stand secondary to LLE weakness and increased lean and LOB to the left in standing.  Slight pusher tendencies are still present to the left side in sitting and standing, however they continue to improve.  Transfers stand pivot are still at a max assist as well as squat pivot transfers to the right, secondary to pushing.  Squat pivot transfers to the left are min assist.  Pt will have a Stedy for use at home with transfers and he can currently complete sit to stand in this for transfers to the toilet and shower at times with min to mod facilitation.  LUE functional use continues to improve in the hand, where he exhibits gross digit flexion and extension as well as some opposition of thumb to all digits, however increased wrist flexor tone is present.  He demonstrates one finger anterior inferior subluxation as well with only Brunnstrum stage III movement in the elbow and shoulder at this time.  He currently demonstrates some ability to feel touch in the left hand but no proprioception is present in the UE with gross testing. Selective attention level is still limited as he needs mod instructional cueing to stay on task once he is distracted with conversation.  Pt is planning to go  home with family and 24 hour assist on 4/30.  Will continue with current OT POC with anticipated family education next week in preparation for discharge home.    Patient continues to demonstrate the following deficits: muscle weakness, impaired timing and sequencing, abnormal tone, unbalanced muscle activation and decreased coordination, decreased midline orientation, decreased attention to left and left side neglect, decreased attention, decreased awareness, decreased problem solving, decreased memory and delayed processing and decreased standing balance, decreased postural control, hemiplegia and decreased balance strategies and therefore will continue to benefit from skilled OT intervention to enhance overall performance with BADL and Reduce care partner burden.  Patient progressing toward long term goals..  Continue plan of care.  OT Short Term Goals Week 5:  OT Short Term Goal 1 (Week 5): Continue working on established LTGs set at min to mod assist for discharge.   Skilled Therapeutic Interventions/Progress Updates:    Pt completed shower and dressing this session.  He needed max assist for supine to sit EOB.  He was able to complete sit to stand using the [facility] for transfer to the shower with mod assist.  Had pt place [name] on the [facility] for sit to stand and maintain visual attention to it while being transferred to the shower.  He completed bathing with min assist for UB and mod assist for LB.  Max hand over hand for initiation and use of the LUE for bathing tasks.  He worked on dressing sit to stand at the sink.  Mod demonstrational cueing  with min assist for donning pullover shirt following hemi technique.  He was able to complete donning brief and pants over his feet with mod assist but needed max assist for dynamic standing balance to pull them up over his hips.  Total assist for TEDs and max assist for donning shoes and left AFO.  Finished session with pt in the wheelchair and call button and  phone in reach with safety belt in place.  LUE supported on half lap tray for protection of subluxed shoulder.    Therapy Documentation Precautions:  Precautions Precautions: Fall Precaution Comments: left shoulder subluxation Restrictions Weight Bearing Restrictions: No  Vital Signs:   Pain: Pain Assessment Pain Scale: 0-10 Pain Score: 4  Pain Type: Acute pain Pain Location: Shoulder Pain Orientation: Left Pain Descriptors / Indicators: Aching Pain Frequency: Intermittent Pain Onset: On-going Pain Intervention(s): Medication (See eMAR) ADL: See Care Tool for some details of ADL  Therapy/Group: Individual Therapy  Franceska Strahm OTR/L 02/01/2019, 12:50 PM

## 2019-02-01 NOTE — Progress Notes (Signed)
Occupational Therapy Session Note  Patient Details  Name: Jeffry Vogelsang MRN: [id number] Date of Birth: November 08, 1945  Today's Date: 02/01/2019 OT Individual Time: 9767-3419 OT Individual Time Calculation (min): 28 min    Short Term Goals: Week 1:  OT Short Term Goal 1 (Week 1): Pt will maintain static sitting balance at EOB/EOM for 5 min wiht MOD A OT Short Term Goal 1 - Progress (Week 1): Not met OT Short Term Goal 2 (Week 1): Pt will recall hemi dressing techniques wiht min VC OT Short Term Goal 2 - Progress (Week 1): Not met OT Short Term Goal 3 (Week 1): Pt will sit to stand in stedy iwht MAX A of 1 to decrease caregiver burden OT Short Term Goal 3 - Progress (Week 1): Not met OT Short Term Goal 4 (Week 1): Pt will manage LUE during transfers wiht no more than 1 VC for L attention OT Short Term Goal 4 - Progress (Week 1): Not met OT Short Term Goal 5 (Week 1): pt will locate all items on L of sink iwht no VC during grooming tasks OT Short Term Goal 5 - Progress (Week 1): Not met  Skilled Therapeutic Interventions/Progress Updates:    1:1. Pt received in w/c agreeable to tx with no pain. Pt completes warm up of towel slides 2x20 elbow flex/ext, shoulder flex/ext, int ext rotation and horizontal ab/adduct. Pt completes grasp release of large wooden beads with MAX A to coordinate reaching of LUE to place beads into bucket. Exited session with pt seated in w/c, call lightin reach and all needs met  Therapy Documentation Precautions:  Precautions Precautions: Fall Precaution Comments: left shoulder subluxation Restrictions Weight Bearing Restrictions: No General:   Vital Signs:     Therapy/Group: Individual Therapy  Tonny Branch 02/01/2019, 10:45 AM

## 2019-02-01 NOTE — Progress Notes (Signed)
Physical Therapy Session Note  Patient Details  Name: Patrick Pollard MRN: [id number] Date of Birth: July 27, 1946  Today's Date: 02/01/2019 PT Individual Time: 1432-1502 PT Individual Time Calculation (min): 30 min   Short Term Goals: Week 4:  PT Short Term Goal 1 (Week 4): Pt will perform slideboard transfers consistently with CGA in both directions PT Short Term Goal 2 (Week 4): Pt will perform sit<>stand with RW and min assist PT Short Term Goal 3 (Week 4): Pt will ambulate mod assist with LRAD x 15 ft  Skilled Therapeutic Interventions/Progress Updates:    Session focused on neuro re-ed to address postural control, coordination, motor activation in LLE, and balance during sit <> stands and gait training. Pt requires verbal and tactile cues for hand placement as well as manual facilitation for weightshifting during sit <> stands and transitional movements with overall mod assist throughout session. During gait, focused on weightshifting to R to promote LLE advancement during swing phase as well as LLE activation in stance phase. Initially used RW with L hand orthosis with max assist +2 but decreased ability for PT to assist with LLE advancement with RW, so changed to 3 muskateers style where PT better able to assist with LLE advancement and stance phase activation, weightshifting, and facilitation of upright posture. Initially requires total assist for management of LLE but after blocked practice isolation of LLE knee extension/flexion and hip flexor activation with foot propped on flat scooter, pt able to activate during second bout of gait (3 muskateers style) with cues to decrease compensatory movements from trunk. Pt able to gait 50' x 2 each trial with +2 assist.    Therapy Documentation Precautions:  Precautions Precautions: Fall Precaution Comments: left shoulder subluxation Restrictions Weight Bearing Restrictions: No   Pain: No complaints of pain   Therapy/Group: Individual  Therapy  Karolee Stamps Darrol Poke, PT, DPT, CBIS  02/01/2019, 3:17 PM

## 2019-02-02 ENCOUNTER — Inpatient Hospital Stay (HOSPITAL_COMMUNITY): Payer: Self-pay | Admitting: Physical Therapy

## 2019-02-02 LAB — GLUCOSE, CAPILLARY
Glucose-Capillary: 136 mg/dL — ABNORMAL HIGH (ref 70–99)
Glucose-Capillary: 143 mg/dL — ABNORMAL HIGH (ref 70–99)
Glucose-Capillary: 103 mg/dL — ABNORMAL HIGH (ref 70–99)

## 2019-02-02 MED ORDER — LORAZEPAM 0.5 MG PO TABS
0.5000 mg | ORAL_TABLET | Freq: Three times a day (TID) | ORAL | Status: DC | PRN
  Administered 2019-02-02 – 2019-02-07 (×6): 0.5 mg via ORAL
  Filled 2019-02-02 (×6): qty 1

## 2019-02-02 NOTE — Progress Notes (Signed)
Physical Therapy Weekly Progress Note  Patient Details  Name: Patrick Pollard MRN: [id number] Date of Birth: September 05, 1946  Beginning of progress report period: January 26, 2019 End of progress report period: February 02, 2019  Today's Date: 02/02/2019 PT Individual Time: 1411-1520 PT Individual Time Calculation (min): 69 min   Patient has met 2 of 3 short term goals. Patient progressing well with therapy demonstrating improvements in sit<>stand transfers, ambulation, and L LE strength. Patient continues to require min/mod assist for sit<>stand transfers with increased assist when patient is fatigued and mod assist of 1 with +2 assist for w/c follow when ambulating at hallway rail or in parallel bars. Pt demonstrates improvement in L LE quadriceps and gluteal activation during stance phase of gait; however, limited ability to maintain sustained contraction.  Patient continues to demonstrate the following deficits muscle weakness and muscle paralysis, decreased cardiorespiratoy endurance, impaired timing and sequencing, abnormal tone, unbalanced muscle activation, decreased coordination and decreased motor planning, decreased midline orientation, decreased attention to left and decreased motor planning, decreased attention, decreased awareness, decreased safety awareness and delayed processing and decreased sitting balance, decreased standing balance, decreased postural control, hemiplegia and decreased balance strategies and therefore will continue to benefit from skilled PT intervention to increase functional independence with mobility.  Patient progressing toward long term goals..  Continue plan of care.  PT Short Term Goals Week 4:  PT Short Term Goal 1 (Week 4): Pt will perform slideboard transfers consistently with CGA in both directions PT Short Term Goal 1 - Progress (Week 4): Progressing toward goal PT Short Term Goal 2 (Week 4): Pt will perform sit<>stand with RW and min assist PT Short Term  Goal 2 - Progress (Week 4): Met PT Short Term Goal 3 (Week 4): Pt will ambulate mod assist with LRAD x 15 ft PT Short Term Goal 3 - Progress (Week 4): Met Week 5:  PT Short Term Goal 1 (Week 5): = LTG due to ELOS  Skilled Therapeutic Interventions/Progress Updates:  Ambulation/gait training;Balance/vestibular training;Cognitive remediation/compensation;Community reintegration;Discharge planning;Disease management/prevention;DME/adaptive equipment instruction;Functional electrical stimulation;Functional mobility training;Neuromuscular re-education;Patient/family education;Pain management;Psychosocial support;Skin care/wound management;Splinting/orthotics;Stair training;Therapeutic Activities;Therapeutic Exercise;UE/LE Coordination activities;UE/LE Strength taining/ROM;Visual/perceptual remediation/compensation;Wheelchair propulsion/positioning   Patient received supine in bed with pt becoming tearful stating he is "very depressed and anxious" today and states that his life now compared to prior is a significant contrast and reports being upset he needs help to do simple things such as repositioning L LE in the bed. Therapist provided emotional support and RN present for medication administration per pt request with pt demonstrating improvement in mood during session - therapist also assisted pt with calling his wife and children at end of session for emotional support. Pt performed supine to sit HOB partially elevated and using bedrails with mod assist for lifting trunk upright. Pt wearing L LE AFO throughout session. Pt performed R lateral scoot transfer EOB to w/c with min assist for scooting hips with cuing for increased anterior trunk weight shift. Pt transported in w/c to/from therapy gym. Pt performed sit<>stands from w/c to RW with mod/min assist for lifting and balance (assist level varying with fatigue level) with cuing for increased anterior trunk weight shift and L LE hip/knee extension. Pt  performed 3 bouts of dynamic standing balance with B UE support on RW to perform repeated R LE marching focusing on sustained gluteal and quadriceps activation in L LE progressed to repeated R LE tap up on 4" yoga block - mirror feedback for midline orientation due to L lateral  trunk lean, manual facilitation and multimodal cuing for L LE knee/hip extension. Performed 5x 52f gait in parallel bars with pt wearing L LE AFO and L UE sling for shoulder support - mod assist provided throughout for L LE swing and stance phase of gait as well as for trunk support - pt demonstrates improved L LE stance phase control if able to achieve heel strike on initial contact. Pt performed squat pivot transfer w/c<>EOM with min assist for lifting and scooting hips with cuing for head/hips relationship and increased anterior trunk weight shift - pt demonstrates increased hip clearance when transferring to the L. Pt performed sit<>stand from EOM to RW with min assist for lifting and L LE blocking with cuing for increased anterior trunk weight shift. Participated in dynamic standing balance task of placing horseshoes from RW to top of mirror without UE support - mirror feedback provided for midline orientation and mod assist for balance and L LE knee extension. Pt transported back to room in w/c and left sitting in w/c with needs in reach, seat belt alarm on, and pt talking on the phone with his family.   Therapy Documentation Precautions:  Precautions Precautions: Fall Precaution Comments: left shoulder subluxation Restrictions Weight Bearing Restrictions: No  Pain: Denies pain during session.   Therapy/Group: Individual Therapy  CTawana Scale PT, DPT 02/02/2019, 7:55 AM

## 2019-02-02 NOTE — Progress Notes (Signed)
Patient c/o increased anxiety today. Patient states he usually takes Lorazepam 1mg  PRN at home. Called Dr Fritzi Mandes and obtained an order for Lorazepam 0.5 mg PO every 8 hours PRN anxiety.  Lorri Frederick, LPN

## 2019-02-03 LAB — GLUCOSE, CAPILLARY
Glucose-Capillary: 165 mg/dL — ABNORMAL HIGH (ref 70–99)
Glucose-Capillary: 131 mg/dL — ABNORMAL HIGH (ref 70–99)
Glucose-Capillary: 120 mg/dL — ABNORMAL HIGH (ref 70–99)
Glucose-Capillary: 134 mg/dL — ABNORMAL HIGH (ref 70–99)
Glucose-Capillary: 130 mg/dL — ABNORMAL HIGH (ref 70–99)

## 2019-02-03 MED ORDER — MIRTAZAPINE 15 MG PO TABS
7.5000 mg | ORAL_TABLET | Freq: Every day | ORAL | Status: DC
  Administered 2019-02-03: 21:00:00 7.5 mg via ORAL
  Filled 2019-02-03: qty 1

## 2019-02-03 NOTE — Progress Notes (Signed)
[facility] PHYSICAL MEDICINE & REHABILITATION PROGRESS NOTE   Subjective/Complaints:  Had some anxiety yesterday which responded to ativan.  Pt took this med at home but not on a daily basis Discussed sleep meds and amount of serotonergic agents  ROS: Patient denies CP, SOB, N/V/D   Objective:   No results found. No results for input(s): WBC, HGB, HCT, PLT in the last 72 hours. No results for input(s): NA, K, CL, CO2, GLUCOSE, BUN, CREATININE, CALCIUM in the last 72 hours.  Intake/Output Summary (Last 24 hours) at 02/03/2019 0853 Last data filed at 02/03/2019 0732 Gross per 24 hour  Intake 340 ml  Output 1700 ml  Net -1360 ml     Physical Exam: Vital Signs Blood pressure 107/71, pulse 91, temperature 97.7 F (36.5 C), temperature source Oral, resp. rate 18, height 6\' 1"  (1.854 m), weight 96.2 kg, SpO2 90 %. Constitutional: No distress . Vital signs reviewed. HEENT: EOMI, oral membranes moist Neck: supple Cardiovascular: RRR without murmur. No JVD    Respiratory: CTA Bilaterally without wheezes or rales. Normal effort    GI: BS +, non-tender, non-distended  Musc: No edema or tenderness in extremities. Neuro: Alert Dysarthria Motor:  3- Left upper extremity:handgrip, 3- biceps and 2- triceps  3-/5, Left lower extremity: 1/5 hip flexion, 2/5 distally plantar flexion--no ADF Sensation absent to LT and proprioception  Skin:intact Psych: anxious  Assessment/Plan: 1. 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  Physiatrist is providing close team supervision and 24 hour management of active medical problems listed below.  Physiatrist and rehab team continue to assess barriers to discharge/monitor patient progress toward functional and medical goals  Care Tool:  Bathing    Body parts bathed by patient: Left arm, Chest, Abdomen, Right upper leg, Left upper  leg, Right lower leg, Face, Front perineal area   Body parts bathed by helper: Left lower leg, Right arm, Buttocks Body parts n/a: Left upper leg, Right upper leg, Right lower leg, Left lower leg, Front perineal area, Buttocks   Bathing assist Assist Level: Moderate Assistance - Patient 50 - 74%     Upper Body Dressing/Undressing Upper body dressing   What is the patient wearing?: Pull over shirt    Upper body assist Assist Level: Minimal Assistance - Patient > 75%    Lower Body Dressing/Undressing Lower body dressing      What is the patient wearing?: Pants, Incontinence brief     Lower body assist Assist for lower body dressing: Maximal Assistance - Patient 25 - 49%     Toileting Toileting    Toileting assist Assist for toileting: Maximal Assistance - Patient 25 - 49%     Transfers Chair/bed transfer  Transfers assist  Chair/bed transfer activity did not occur: Safety/medical concerns  Chair/bed transfer assist level: Minimal Assistance - Patient > 75%(squat pivot)     Locomotion Ambulation   Ambulation assist   Ambulation activity did not occur: Safety/medical concerns  Assist level: 2 helpers Assistive device: Parallel bars Max distance: 648ft   Walk 10 feet activity   Assist  Walk 10 feet activity did not occur: Safety/medical concerns  Assist level: 2 helpers Assistive device: Other (comment)(rail in hallway)   Walk 50 feet activity   Assist Walk 50 feet with 2 turns activity did not occur: Safety/medical concerns  Assist level: 2 helpers      Walk 150 feet activity   Assist Walk  150 feet activity did not occur: Safety/medical concerns         Walk 10 feet on uneven surface  activity   Assist Walk 10 feet on uneven surfaces activity did not occur: Safety/medical concerns         Wheelchair     Assist Will patient use wheelchair at discharge?: Yes Type of Wheelchair: Manual Wheelchair activity did not occur:  Safety/medical concerns  Wheelchair assist level: Supervision/Verbal cueing Max wheelchair distance: 33'    Wheelchair 50 feet with 2 turns activity    Assist    Wheelchair 50 feet with 2 turns activity did not occur: Safety/medical concerns   Assist Level: Supervision/Verbal cueing   Wheelchair 150 feet activity     Assist Wheelchair 150 feet activity did not occur: Safety/medical concerns   Assist Level: Minimal Assistance - Patient > 75%    Medical Problem List and Plan: 1.Left side weakness with facial droopas well as left hemi-sensory losssecondary to right thalamic hemorrhage with IVH secondary to hypertensive crisis  Continue CIR, PT, OT, SLP Extended discharge date to 02/07/2019 based on improvements and ability to achieve min assist level Daughter coming in for family training2. Antithrombotics: -DVT/anticoagulation:SCDs -antiplatelet therapy: N/A 3. Pain Management:Tylenol as needed 4. Mood:Abilify 5 mg daily, Effexor 300 mg daily. Provide emotional support appreciate psychiatry note Reduce Remeron dose, doing well with herbal supplements     Prn ativan  -antipsychotic agents: Abilify  Major depressive disorder 5. Neuropsych: This patientiscapable of making decisions on hisown behalf. 6. Skin/Wound Care:Routine skin checks 7. Fluids/Electrolytes/Nutrition:Routine ins and outs   Poor p.o. intake- Remeron started on 4/3 --intake excellent   8.  Essential hypertension  Cont Lisinopril 20 mg daily,   Maxide 37.5-25 mg daily DC'd on 4/4 due to hyponatremia  Monitor with increased mobility Vitals:   02/02/19 2007 02/03/19 0523  BP: 110/78 107/71  Pulse: 96 91  Resp: 18 18  Temp: 98 F (36.7 C) 97.7 F (36.5 C)  SpO2: 95% 90%   Amlodipine increased to 5mg  on 3/29  Controlled on 4/24 9. BPH. Flomax 0.4 mg daily, removed  Foley  10. History of GI bleed . Continue Protonix 11. Hyperlipidemia. Resume Lipitor on  discharge. 12.  Constipation-bowel meds increased on 3/30, again on 3/31, again on 4/1     13.  Prediabetes although CBGs have been consistently elevated post CVA likely due to decreased activity  Diet changed to carb modified   SSI ordered on 4/2, will likely not need at home  Appreciate dietary consult   CBG (last 3)  Recent Labs    02/02/19 1623 02/02/19 2123 02/03/19 0621  GLUCAP 103* 165* 131*  increasing add low dose metformin 250mg  BID started 4/22, well controlled 14.  Hyponatremia  Sodium 126 on 4/4, 129 on 4/6, asymptomatic  Improved to 131 4/10  Continue to monitor 15.  Sleep disturbance-   remeron   15mg    - ritalin 10mg   in am to help with am somnolence, the patient is overall doing better with this however he still is not satisfied with his quality of sleep. Pt took melatonin and drank camomile teaECG on 3/24     trazodone 50 started on 3/30, DC'd on 4/3   Sleep chart ordered  -consider low dose seroquel for sleep 16. Renal:   -Improving416.  AKI  Creatinine 0.88 on 4/4  Off IVF  Encourage fluids    17.  Leukocytosis- WBCs 12.5 on 4/3, 13.8 on 4/6, remains afebrile  Continue to monitor 18.  Bladder issues - urgency likely multifactorial  CVA Meds , prostate,   -stopped urecholine, seems to be emptying well now but still has CVA related urgency, would avoid anticholinergics, may consider Myrbetriq (B3 adrenergic agonist)   LOS: 30 days A FACE TO FACE EVALUATION WAS PERFORMED  Erick Colace 02/03/2019, 8:53 AM

## 2019-02-03 NOTE — Progress Notes (Signed)
Physical Therapy Session Note  Patient Details  Name: Patrick Pollard MRN: [id number] Date of Birth: 06/13/1946  Today's Date: 02/03/2019 PT Individual Time: 0926-1004 PT Individual Time Calculation (min): 38 min   Short Term Goals: Week 5:  PT Short Term Goal 1 (Week 5): = LTG due to ELOS  Skilled Therapeutic Interventions/Progress Updates:    Pt supine in bed upon PT arrival, agreeable to therapy tx and denies pain. Therapist donned teds, shoes and L AFO for time management, total assist. Pt transferred to sitting EOB with min assist and cues for techniques. Pt performed slideboard lateral scoot transfer to the w/c with CGA. Pt transported to the gym. Pt performed slideboard transfer this session from w/c<>couch for practicing unlevel transfers, min assist to get on the couch, mod assist to get off the couch secondary to steep incline. Therapist educated pt not to sit on low surfaces at home. Pt participated in gait training at the rail this session. Pt ambulated 2 x 15 ft with R rail for UE support, L sling donned for comfort and mod assist from therapist with cues for sequencing and step length, pt able to advance L LE during swing, therapist assisting with L stance control with manual facilitation for quadriceps activation. Pt transported back to room and left in w/c with needs in reach and chair alarm set.   Therapy Documentation Precautions:  Precautions Precautions: Fall Precaution Comments: left shoulder subluxation Restrictions Weight Bearing Restrictions: No    Therapy/Group: Individual Therapy  Cresenciano Genre, PT, DPT 02/03/2019, 11:46 AM

## 2019-02-03 NOTE — Progress Notes (Signed)
Physical Therapy Session Note  Patient Details  Name: Patrick Pollard MRN: [id number] Date of Birth: 04-Nov-1945  Today's Date: 02/03/2019 PT Individual Time: 1125-1155 PT Individual Time Calculation (min): 30 min   Short Term Goals: Week 5:  PT Short Term Goal 1 (Week 5): = LTG due to ELOS  Skilled Therapeutic Interventions/Progress Updates:    Bed mobility retraining with min to mod assist for supine to sit EOB. Pt able to reciprocally scoot hips with supervision. Total assist to don shoes and AFO for time management. Focused on NMR to address sit <.> stands and gait training using hemiwalker (discussed with primary PT). Pt requires mod assist for multiple sit <> stands with focus on technique and facilitation for anterior weightshift with cues for pushing through BLE to achieve full upright posture. Trial with hemiwalker to improve functional mobility some pre-gait forward stepping and progressed to gait x 8' with hemiwalker and +2 assist needed for safety. Pt difficulty with advancement of LLE and requires physical assist from PT as well as facilitation for adequate weightshift. Pt expresses hesitancy due to fear of falling as well and feel that this may be limiting his ability to progress consistently. Pt eager to keep trying with hemiwalker in future session. Discussed pros and cons and clinical reasoning for RW vs hemiwalker. If continues to trial hemiwalker, would benefit from use of giv-mohr sling potentially to maintain proper positioning of LUE and promote weightbearing during upright mobility. Sit <> stand and weightshifting in standing with mirror for carryover to gait with overall min to mod assist. Pt brushed teeth and then set up in w/c with lap tray on and safety belt donned. All needs in reach.   Therapy Documentation Precautions:  Precautions Precautions: Fall Precaution Comments: left shoulder subluxation Restrictions Weight Bearing Restrictions: No   Pain: Pain  Assessment Pain Scale: 0-10 Pain Score: 0-No pain    Therapy/Group: Individual Therapy  Karolee Stamps Darrol Poke, PT, DPT, CBIS  02/03/2019, 12:09 PM

## 2019-02-04 ENCOUNTER — Inpatient Hospital Stay (HOSPITAL_COMMUNITY): Payer: Self-pay

## 2019-02-04 ENCOUNTER — Inpatient Hospital Stay (HOSPITAL_COMMUNITY): Payer: Self-pay | Admitting: Occupational Therapy

## 2019-02-04 LAB — GLUCOSE, CAPILLARY
Glucose-Capillary: 152 mg/dL — ABNORMAL HIGH (ref 70–99)
Glucose-Capillary: 90 mg/dL (ref 70–99)
Glucose-Capillary: 130 mg/dL — ABNORMAL HIGH (ref 70–99)
Glucose-Capillary: 124 mg/dL — ABNORMAL HIGH (ref 70–99)

## 2019-02-04 NOTE — Progress Notes (Signed)
Patient ID: Patrick Pollard, male   DOB: Jan 13, 1946, 73 y.o.   MRN: [id number]   Diagnosis code:  I61.8; I69.354  Height:  6'1"  Weight:  96.2kg (212lbs)   Patient had a CVA which requires his head and left arm/leg to be positioned in ways not feasible with a normal bed.  Hemiplegia and hemiparesis requires frequent and immediate changes in body position which cannot be achieved with a normal bed.  This prevents patient from poor positioning and skin breakdown.

## 2019-02-04 NOTE — Progress Notes (Signed)
Occupational Therapy Session Note  Patient Details  Name: Patrick Pollard MRN: [id number] Date of Birth: 07/07/1946  Today's Date: 02/04/2019 OT Individual Time: 1405-1500 OT Individual Time Calculation (min): 55 min    Short Term Goals: Week 5:  OT Short Term Goal 1 (Week 5): Continue working on established LTGs set at min to mod assist for discharge.   Skilled Therapeutic Interventions/Progress Updates:    Treatment session with focus on functional mobility, LB dressing, LUE NMR, and positioning of LUE.  Pt received supine in bed reporting need to void urine.  Pt requested use of urinal vs transferring to toilet.  Required setup for positioning and emptying of urinal.  Therapist provided assistance with LB dressing with pt able to instruct therapist in hemi-dressing technique, ultimately requiring mod assist at bed level incorporating bridging to pull pants over hips.  Transferred bed > w/c with use of Stedy and min assist for sit > stand.  Engaged in LUE NMR in sitting at table with focus on shoulder flexion/extension as needed for reaching.  Therapist provided tactile cues and facilitation at elbow to minimize shoulder subluxation and decrease effects of gravity to allow for reaching.  Pt demonstrating good initiation and activation of movement.  Incorporated grasp in to reaching activity with pt able to sustain grasp and release items.  Applied kinesiotape to Lt shoulder for increased positioning to minimize shoulder subluxation, while allowing for continued use of e-stim for subluxation.  Pt returned to bed via [facility] as pt reports fatigue.  Left in supine with all needs in reach.  Therapy Documentation Precautions:  Precautions Precautions: Fall Precaution Comments: left shoulder subluxation Restrictions Weight Bearing Restrictions: No Pain: Pain Assessment Pain Score: 0-No pain   Therapy/Group: Individual Therapy  Rosalio Loud 02/04/2019, 3:18 PM

## 2019-02-04 NOTE — Progress Notes (Signed)
[facility] PHYSICAL MEDICINE & REHABILITATION PROGRESS NOTE   Subjective/Complaints:  No issues with sleep.  He took only 1/2 tablet of Remeron last night.  Working with therapy this morning.  Speech is recommending home health SLP  ROS: Patient denies CP, SOB, N/V/D   Objective:   No results found. No results for input(s): WBC, HGB, HCT, PLT in the last 72 hours. No results for input(s): NA, K, CL, CO2, GLUCOSE, BUN, CREATININE, CALCIUM in the last 72 hours.  Intake/Output Summary (Last 24 hours) at 02/04/2019 0905 Last data filed at 02/04/2019 0328 Gross per 24 hour  Intake 720 ml  Output 1600 ml  Net -880 ml     Physical Exam: Vital Signs Blood pressure 122/71, pulse 92, temperature 98 F (36.7 C), temperature source Oral, resp. rate 18, height 6\' 1"  (1.854 m), weight 96.2 kg, SpO2 94 %. Constitutional: No distress . Vital signs reviewed. HEENT: EOMI, oral membranes moist Neck: supple Cardiovascular: RRR without murmur. No JVD    Respiratory: CTA Bilaterally without wheezes or rales. Normal effort    GI: BS +, non-tender, non-distended  Musc: No edema or tenderness in extremities. Neuro: Alert Dysarthria Motor:  3- Left upper extremity:handgrip, 3- biceps and 2- triceps  3-/5, Left lower extremity: 1/5 hip flexion, 2/5 distally plantar flexion--no ADF Sensation absent to LT and proprioception, unchanged Skin:intact Psych: anxious  Assessment/Plan: 1. 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  Physiatrist is providing close team supervision and 24 hour management of active medical problems listed below.  Physiatrist and rehab team continue to assess barriers to discharge/monitor patient progress toward functional and medical goals  Care Tool:  Bathing    Body parts bathed by patient: Left arm, Chest, Abdomen, Right upper leg, Left upper leg, Right  lower leg, Face, Front perineal area   Body parts bathed by helper: Left lower leg, Right arm, Buttocks Body parts n/a: Left upper leg, Right upper leg, Right lower leg, Left lower leg, Front perineal area, Buttocks   Bathing assist Assist Level: Moderate Assistance - Patient 50 - 74%     Upper Body Dressing/Undressing Upper body dressing   What is the patient wearing?: Pull over shirt    Upper body assist Assist Level: Minimal Assistance - Patient > 75%    Lower Body Dressing/Undressing Lower body dressing      What is the patient wearing?: Pants, Incontinence brief     Lower body assist Assist for lower body dressing: Maximal Assistance - Patient 25 - 49%     Toileting Toileting    Toileting assist Assist for toileting: Maximal Assistance - Patient 25 - 49%     Transfers Chair/bed transfer  Transfers assist  Chair/bed transfer activity did not occur: Safety/medical concerns  Chair/bed transfer assist level: Contact Guard/Touching assist(slideboard)     Locomotion Ambulation   Ambulation assist   Ambulation activity did not occur: Safety/medical concerns  Assist level: 2 helpers Assistive device: Walker-hemi Max distance: 8'   Walk 10 feet activity   Assist  Walk 10 feet activity did not occur: Safety/medical concerns  Assist level: Moderate Assistance - Patient - 50 - 74% Assistive device: Other (comment)(rail)   Walk 50 feet activity   Assist Walk 50 feet with 2 turns activity did not occur: Safety/medical concerns  Assist level: 2 helpers      Walk 150 feet activity   Assist Walk 150 feet activity  did not occur: Safety/medical concerns         Walk 10 feet on uneven surface  activity   Assist Walk 10 feet on uneven surfaces activity did not occur: Safety/medical concerns         Wheelchair     Assist Will patient use wheelchair at discharge?: Yes Type of Wheelchair: Manual Wheelchair activity did not occur:  Safety/medical concerns  Wheelchair assist level: Supervision/Verbal cueing Max wheelchair distance: 108'    Wheelchair 50 feet with 2 turns activity    Assist    Wheelchair 50 feet with 2 turns activity did not occur: Safety/medical concerns   Assist Level: Supervision/Verbal cueing   Wheelchair 150 feet activity     Assist Wheelchair 150 feet activity did not occur: Safety/medical concerns   Assist Level: Minimal Assistance - Patient > 75%    Medical Problem List and Plan: 1.Left side weakness with facial droopas well as left hemi-sensory losssecondary to right thalamic hemorrhage with IVH secondary to hypertensive crisis  Continue CIR, PT, OT, SLP Family training prior to discharge on 02/07/2019 2. Antithrombotics: -DVT/anticoagulation:SCDs -antiplatelet therapy: N/A 3. Pain Management:Tylenol as needed 4. Mood:Abilify 5 mg daily, Effexor 300 mg daily. Provide emotional support appreciate psychiatry note Discontinue Remeron dose, doing well with herbal supplements     Prn ativan  -antipsychotic agents: Abilify  Major depressive disorder 5. Neuropsych: This patientiscapable of making decisions on hisown behalf. 6. Skin/Wound Care:Routine skin checks 7. Fluids/Electrolytes/Nutrition:Routine ins and outs   Poor p.o. intake- Remeron started on 4/3 --intake excellent   8.  Essential hypertension  Cont Lisinopril 20 mg daily,   Maxide 37.5-25 mg daily DC'd on 4/4 due to hyponatremia  Monitor with increased mobility Vitals:   02/03/19 2001 02/04/19 0451  BP: 98/73 122/71  Pulse: 87 92  Resp: 17 18  Temp: 98.3 F (36.8 C) 98 F (36.7 C)  SpO2: 96% 94%   Amlodipine increased to  on 3/29  Controlled on 4/27, one systolic <16mmHg consider reducing meds 9. BPH. Flomax 0.4 mg daily, removed  Foley  10. History of GI bleed . Continue Protonix 11. Hyperlipidemia. Resume Lipitor on discharge. 12.  Constipation-bowel meds  increased on 3/30, again on 3/31, again on 4/1     13.  Prediabetes although CBGs have been consistently elevated post CVA likely due to decreased activity  Diet changed to carb modified   SSI ordered on 4/2, will likely not need at home  Appreciate dietary consult   CBG (last 3)  Recent Labs    02/03/19 1658 02/03/19 2052 02/04/19 0625  GLUCAP 134* 130* 152*  increasing add low dose metformin  BID started 4/22, well controlled on 4/27 14.  Hyponatremia  Sodium 126 on 4/4, 129 on 4/6, asymptomatic  Improved to 131 4/10  Continue to monitor 15.  Sleep disturbance-overall improved discontinue Remeron - ritalin   in am to help with am somnolence, the patient is overall doing better with this however he still is not satisfied with his quality of sleep. Pt took melatonin and drank camomile teaECG on 3/24     trazodone 50 started on 3/30, DC'd on 4/3   Sleep chart ordered  -consider low dose seroquel for sleep 16. Renal:   -Improving416.  AKI  Creatinine 0.88 on 4/4  Off IVF  Encourage fluids    17.  Leukocytosis- WBCs 12.5 on 4/3, 13.8 on 4/6, remains afebrile     Continue to monitor 18.  Bladder issues - urgency likely multifactorial  CVA Meds , prostate,   -stopped urecholine, seems to be emptying well now but still has CVA related urgency, would avoid anticholinergics, may consider Myrbetriq (B3 adrenergic agonist)   LOS: 31 days A FACE TO FACE EVALUATION WAS PERFORMED  Erick Colace 02/04/2019, 9:05 AM

## 2019-02-04 NOTE — Progress Notes (Signed)
Physical Therapy Session Note  Patient Details  Name: Patrick Pollard MRN: [id number] Date of Birth: 12/05/1945  Today's Date: 02/04/2019 PT Individual Time: 1120-1230 and 1602-1700 PT Individual Time Calculation (min): 70 min   Short Term Goals: Week 5:  PT Short Term Goal 1 (Week 5): = LTG due to ELOS  Skilled Therapeutic Interventions/Progress Updates:    Session 1: Pt seated in w/c upon PT arrival, agreeable to therapy tx and denies pain. Pt transported outside this session to perform real car transfer for family education. Pt performed mod assist slideboard transfer from w/c<>car with assist from therapist, therapist educating on techniques, set up and cues to provide throughout the transfer. Therapist instructed family and pt to wear the L UE sling during transfers to protect L shoulder/arm. Pt performed x 2 car transfers with assist from his daughter, therapist directing set up/techniques for the first transfer and able to give less cues for the second transfer. Therapist educated family to place the board under the pt prior to moving the w/c next to the car since there is limited space. Pt performed x 2 sit<>stands this session from w/c with RW, mod assist and cues for techniques. Therapist provided other education to pt's daughter including not sitting on low surfaces at home, using the stedy for any standing with the family and education on home health and services they offer. Pt transported back to room and left in w/c with needs in reach and chair alarm set.   Session 2: Pt supine in bed upon PT arrival, agreeable to therapy tx and denies pain. Pt performed supine>sitting with min assist. Pt repeated sidelying<>sitting transfer for increased independence with bed mobility with able to perform this x 2 with CGA and x 1 with supervision, cues for techniques. Pt transported to the gym. Pt performed stand pivot transfer this session with hemiwalker and min assist, cues for techniques,  towards R side. Pt performed stand pivot transfer with hemiwalker towards L side with mod assist and cues for techniques. Pt worked on Investment banker, operational this session with different assistive devices. Pt ambulated x 10 ft and x 20 ft this session with hemiwalker and mod assist, cues for techniques, pt able to advance L LE and therapist blocking L knee 70% of the time otherwise pt able to maintain stance control. Pt ambulated x 5 ft with RW and max assist, cues for techniques, pt with difficulty steering RW. Pt worked on L LE stance control and weightbearing to perform R LE stepping in place 2 x 10 with R UE support on quad cane, cues for techniques. Pt ambulated again x 20 ft with hemiwalker and mod assist, L AFO and shoes donned, cues for step length and sequencing. Pt transported back to room and left in w/c with needs in reach and chair alarm set.    Therapy Documentation Precautions:  Precautions Precautions: Fall Precaution Comments: left shoulder subluxation Restrictions Weight Bearing Restrictions: No    Therapy/Group: Individual Therapy  Cresenciano Genre, PT, DPT 02/04/2019, 7:46 AM

## 2019-02-04 NOTE — Progress Notes (Signed)
  Patient ID: Patrick Pollard, male   DOB: 1946-05-20, 73 y.o.   MRN: [id number]      Diagnosis codes:  I61.8; I69.354  Height:  6'1"            Weight:  96.2kg (212lbs)          Patient suffers from a CVA which impairs their ability to perform daily activities like toileting, feeding, dressing, grooming, and bathing in the home.  A walker will not resolve issue with performing activities of daily living.  A wheelchair will allow patient to safely perform daily activities.  Patient is not able to propel themselves in the home using a standard weight wheelchair due to arm weakness and endurance .  Patient can self propel in the lightweight wheelchair.

## 2019-02-04 NOTE — Progress Notes (Addendum)
Speech Language Pathology Weekly Progress and Session Note  Patient Details  Name: Patrick Pollard MRN: [id number] Date of Birth: 1946/02/20  Beginning of progress report period: January 24, 2019 End of progress report period: February 04, 2019  Today's Date: 02/04/2019 SLP Individual Time: 6301-6010 SLP Individual Time Calculation (min): 35 min  Short Term Goals: Week 4: SLP Short Term Goal 1 (Week 4): STGs=LTGs d/t remaining length of stay SLP Short Term Goal 1 - Progress (Week 4): Progressing toward goal    New Short Term Goals: Week 5: SLP Short Term Goal 1 (Week 5): STGs=LTGs d/t remaining length of stay  Weekly Progress Updates: Pt demonstrated great progress this reporting period progressing to supervision in complex recall, complex problem solving and alternating attention. Pt continues to require min-supervision A for the skills listed above and required structured task with reminders to list executive function strategies prior to beginning task to increase success and reduce anxiety. SLP will focus remainder of session on family education and alternating attention in complex problem solving task, as well as opportunites to demonstrate ownership over executive function strategies. Pt would continue to benefit from skilled ST services in order to maximize functional independenc and reduce burden of care required 24 hour supervision and continued [facility] services.      Intensity: Minumum of 1-2 x/day, 30 to 90 minutes Frequency: 3 to 5 out of 7 days Duration/Length of Stay: 4/30 Treatment/Interventions: Cognitive remediation/compensation;Medication managment;Functional tasks;Patient/family education;Therapeutic Activities   Daily Session  Skilled Therapeutic Interventions:  Skilled ST services focused on cognitive skills. SLP facilitated executive function skills listing strategy to complete complex problem solving task utilizing account balancing, pt required supervision A verbal cues  to list plan/strategies to complete task, min A verbal cues fade to supervision A verbal cues for complex problem solving within task and Min A verbal cues fade to supervision A verbal cues for correction of errors. Task was further initially complicated by use of calculator on phone, however given larger calculator, pt demonstrated greater ability to problem solve, recognize and correct errors. Pt demonstrated ability to alternate attention between conversation and task in 30 minute intervals with min A verbal cues. Pt was left in room with call bell within reach and bed alarm set. ST recommends to continue skilled ST services.     General    Pain Pain Assessment Pain Score: 0-No pain  Therapy/Group: Individual Therapy  Chinonso Linker  [facility] 02/04/2019, 3:12 PM

## 2019-02-04 NOTE — Progress Notes (Signed)
Occupational Therapy Session Note  Patient Details  Name: Patrick Pollard MRN: [id number] Date of Birth: 1945/10/26  Today's Date: 02/04/2019 OT Individual Time: 0930-1000 OT Individual Time Calculation (min): 30 min    Short Term Goals: Week 4:  OT Short Term Goal 1 (Week 4): Continue working on established LTGs set at min to mod assist for discharge.   Skilled Therapeutic Interventions/Progress Updates:    Pt received supine with no c/o pain. Pt encouraged to check brief d/t smell of urine present. Pt was indeed incontinent and completed bed mobility R and L to change brief. Pt able to complete anterior and posterior peri hygiene with set up assist at bed level. Pt completed bed mobility to transition to EOB with mod A. From EOB pt able to thread R LE through pants, with assistance to thread L. Pt required mod A to pull up in standing with use of hemi walker for RUE support. From standing pt completed several steps, requiring max A to advance LLE, and mod A overall to transfer into w/c. Pt left sitting up at sink with NT Mark present.   Therapy Documentation Precautions:  Precautions Precautions: Fall Precaution Comments: left shoulder subluxation Restrictions Weight Bearing Restrictions: No   Therapy/Group: Individual Therapy  Crissie Reese 02/04/2019, 7:23 AM

## 2019-02-05 ENCOUNTER — Inpatient Hospital Stay (HOSPITAL_COMMUNITY): Payer: Self-pay

## 2019-02-05 ENCOUNTER — Encounter (HOSPITAL_COMMUNITY): Payer: Self-pay

## 2019-02-05 ENCOUNTER — Ambulatory Visit (HOSPITAL_COMMUNITY): Payer: Self-pay

## 2019-02-05 ENCOUNTER — Encounter (HOSPITAL_COMMUNITY): Payer: Medicare Other | Admitting: Occupational Therapy

## 2019-02-05 LAB — GLUCOSE, CAPILLARY
Glucose-Capillary: 135 mg/dL — ABNORMAL HIGH (ref 70–99)
Glucose-Capillary: 97 mg/dL (ref 70–99)
Glucose-Capillary: 106 mg/dL — ABNORMAL HIGH (ref 70–99)
Glucose-Capillary: 146 mg/dL — ABNORMAL HIGH (ref 70–99)

## 2019-02-05 NOTE — Progress Notes (Signed)
PHYSICAL MEDICINE & REHABILITATION PROGRESS NOTE   Subjective/Complaints:  Slept ok "but not sure" No anxiety this am, no oversedation  ROS: Patient denies CP, SOB, N/V/D   Objective:   No results found. No results for input(s): WBC, HGB, HCT, PLT in the last 72 hours. No results for input(s): NA, K, CL, CO2, GLUCOSE, BUN, CREATININE, CALCIUM in the last 72 hours.  Intake/Output Summary (Last 24 hours) at 02/05/2019 1553 Last data filed at 02/05/2019 1000 Gross per 24 hour  Intake 480 ml  Output 1575 ml  Net -1095 ml     Physical Exam: Vital Signs Blood pressure 116/76, pulse 94, temperature 97.9 F (36.6 C), temperature source Oral, resp. rate 19, height 6\' 1"  (1.854 m), weight 96.2 kg, SpO2 95 %. Constitutional: No distress . Vital signs reviewed. HEENT: EOMI, oral membranes moist Neck: supple Cardiovascular: RRR without murmur. No JVD    Respiratory: CTA Bilaterally without wheezes or rales. Normal effort    GI: BS +, non-tender, non-distended  Musc: No edema or tenderness in extremities. Neuro: Alert Dysarthria Motor:  3- Left upper extremity:handgrip, 3- biceps and 2- triceps  3-/5, Left lower extremity: 1/5 hip flexion, 2/5 distally plantar flexion--no ADF Sensation absent to LT and proprioception, unchanged Skin:intact Psych: anxious  Assessment/Plan: 1. 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  Physiatrist is providing close team supervision and 24 hour management of active medical problems listed below.  Physiatrist and rehab team continue to assess barriers to discharge/monitor patient progress toward functional and medical goals  Care Tool:  Bathing    Body parts bathed by patient: Left arm, Chest, Abdomen, Right upper leg, Left upper leg, Right lower leg, Face, Front perineal area, Left lower leg   Body parts bathed by  helper: Right upper leg, Buttocks Body parts n/a: Left upper leg, Right upper leg, Right lower leg, Left lower leg, Front perineal area, Buttocks   Bathing assist Assist Level: Moderate Assistance - Patient 50 - 74%     Upper Body Dressing/Undressing Upper body dressing   What is the patient wearing?: Pull over shirt    Upper body assist Assist Level: Maximal Assistance - Patient 25 - 49%    Lower Body Dressing/Undressing Lower body dressing      What is the patient wearing?: Pants, Incontinence brief     Lower body assist Assist for lower body dressing: Maximal Assistance - Patient 25 - 49%     Toileting Toileting    Toileting assist Assist for toileting: Maximal Assistance - Patient 25 - 49%     Transfers Chair/bed transfer  Transfers assist  Chair/bed transfer activity did not occur: Safety/medical concerns  Chair/bed transfer assist level: Contact Guard/Touching assist     Locomotion Ambulation   Ambulation assist   Ambulation activity did not occur: Safety/medical concerns  Assist level: Moderate Assistance - Patient 50 - 74% Assistive device: Walker-hemi Max distance: 20 ft   Walk 10 feet activity   Assist  Walk 10 feet activity did not occur: Safety/medical concerns  Assist level: Moderate Assistance - Patient - 50 - 74% Assistive device: Walker-hemi   Walk 50 feet activity   Assist Walk 50 feet with 2 turns activity did not occur: Safety/medical concerns  Assist level: 2 helpers      Walk 150 feet activity   Assist Walk 150 feet activity did not occur: Safety/medical concerns  Walk 10 feet on uneven surface  activity   Assist Walk 10 feet on uneven surfaces activity did not occur: Safety/medical concerns         Wheelchair     Assist Will patient use wheelchair at discharge?: Yes Type of Wheelchair: Manual Wheelchair activity did not occur: Safety/medical concerns  Wheelchair assist level:  Supervision/Verbal cueing Max wheelchair distance: 67'    Wheelchair 50 feet with 2 turns activity    Assist    Wheelchair 50 feet with 2 turns activity did not occur: Safety/medical concerns   Assist Level: Supervision/Verbal cueing   Wheelchair 150 feet activity     Assist Wheelchair 150 feet activity did not occur: Safety/medical concerns   Assist Level: Minimal Assistance - Patient > 75%    Medical Problem List and Plan: 1.Left side weakness with facial droopas well as left hemi-sensory losssecondary to right thalamic hemorrhage with IVH secondary to hypertensive crisis  Team conf in am Family training prior to discharge on 02/07/2019 2. Antithrombotics: -DVT/anticoagulation:SCDs -antiplatelet therapy: N/A 3. Pain Management:Tylenol as needed 4. Mood:Abilify 5 mg daily, Effexor 300 mg daily. Provide emotional support appreciate psychiatry note Monitor offRemeron dose, doing well with herbal supplements     Prn ativan  -antipsychotic agents: Abilify  Major depressive disorder 5. Neuropsych: This patientiscapable of making decisions on hisown behalf. 6. Skin/Wound Care:Routine skin checks 7. Fluids/Electrolytes/Nutrition:Routine ins and outs   Poor p.o. intake- Remeron started on 4/3 --intake excellent   8.  Essential hypertension  Cont Lisinopril 20 mg daily,   Maxide 37.5-25 mg daily DC'd on 4/4 due to hyponatremia  Monitor with increased mobility Vitals:   02/05/19 1510 02/05/19 1515  BP:  116/76  Pulse: 95 94  Resp:  19  Temp:  97.9 F (36.6 C)  SpO2: 92% 95%   Amlodipine increased to 5mg  on 3/29  Controlled on 4/28,  9. BPH. Flomax 0.4 mg daily, removed  Foley  10. History of GI bleed . Continue Protonix 11. Hyperlipidemia. Resume Lipitor on discharge. 12.  Constipation-bowel meds increased on 3/30, again on 3/31, again on 4/1     13.  Prediabetes although CBGs have been consistently elevated post CVA likely  due to decreased activity  Diet changed to carb modified   SSI ordered on 4/2, will likely not need at home  Appreciate dietary consult   CBG (last 3)  Recent Labs    02/04/19 2103 02/05/19 0623 02/05/19 1242  GLUCAP 124* 135* 97  increasing add low dose metformin 250mg  BID started 4/22, well controlled on 4/28 14.  Hyponatremia  Sodium 126 on 4/4, 129 on 4/6, asymptomatic  Improved to 131 4/10  Continue to monitor 15.  Sleep disturbance-overall improved discontinue Remeron - ritalin 10mg   in am to help with am somnolence, the patient is overall doing better with this however he still is not satisfied with his quality of sleep. Pt took melatonin and drank camomile teaECG on 3/24     trazodone 50 started on 3/30, DC'd on 4/3   Sleep chart ordered  -consider low dose seroquel for sleep 16. Renal:   -Improving416.  AKI  Creatinine 0.88 on 4/4  Off IVF  Encourage fluids    17.  Leukocytosis- WBCs 12.5 on 4/3, 13.8 on 4/6, remains afebrile     Continue to monitor 18.  Bladder issues - urgency likely multifactorial  CVA Meds , prostate,   -stopped urecholine, seems to be emptying well now but still has CVA related urgency, would  avoid anticholinergics, may consider Myrbetriq (B3 adrenergic agonist)   LOS: 32 days A FACE TO FACE EVALUATION WAS PERFORMED  Erick Colace 02/05/2019, 3:53 PM

## 2019-02-05 NOTE — Plan of Care (Signed)
  Problem: Consults Goal: RH STROKE PATIENT EDUCATION Description See Patient Education module for education specifics  Outcome: Progressing   Problem: RH SAFETY Goal: RH STG ADHERE TO SAFETY PRECAUTIONS W/ASSISTANCE/DEVICE Description STG Adhere to Safety Precautions With supervision Assistance/Device.  Outcome: Progressing   Problem: RH KNOWLEDGE DEFICIT Goal: RH STG INCREASE KNOWLEDGE OF HYPERTENSION Description Patient/caregiver will verbalize understanding of HTN management including medications, monitoring, follow up visits, and diet with min assist.  Outcome: Progressing Goal: RH STG INCREASE KNOWLEDGE OF STROKE PROPHYLAXIS Description Patient/caregiver will verbalize understanding of stroke prophylaxis management including medications, monitoring, follow up visits, and diet with min assist.  Outcome: Progressing

## 2019-02-05 NOTE — Progress Notes (Signed)
Speech Language Pathology Discharge Summary  Patient Details  Name: Patrick Pollard MRN: [id number] Date of Birth: 1946/06/03  Today's Date: 02/06/2019 SLP Individual Time: 1300-1325 SLP Individual Time Calculation (min): 25 min   Skilled Therapeutic Interventions:  Skilled treatment session focused on cognitive goals. SLP facilitated session by providing education in regards to memory strategies and how to implement the strategies at home. Patient verbalized understanding and generated examples with overall supervision level verbal cues. Patient also required supervision level verbal cues for anticipatory awareness in regards to need of establishing a new "routine" to the day since he now requires physical assistance. Patient verbalized understanding of all information and also independently generated appropriate activities he can participate in safely at discharge. Patient left upright in bed with alarm on and all needs within reach. Continue with current plan of care.   Patient has met 3 of 3 long term goals.  Patient to discharge at overall Supervision level.   Reasons goals not met: N/A   Clinical Impression/Discharge Summary:  Pt demonstrated great progress meeting 3 out 3 goals discharging at a supervision level. Pt demonstrated improvement in complex problem solving skills, complex recall and selective/alternating attention skills utilizing functional tasks such as scheduling, medication and money management. Family education was completed with pt's daughter Nira Conn) primary caregiver, pertaining to executive function impairments focusing on pre-task planning in complex problem solving tasks, utilizing compensatory strategies for complex recall and internal/external strategies for managing selective/alternating attention. Nira Conn supports baseline attention deficits, regulating internal distractions and maintaining topic, however further exacerbated from acute CVA. All questions answered to  satisfaction. Pt would continue to benefit from skilled ST services in order to maximize functional independence and reduce burden of care requiring 24 hour supervision and continued [facility] services.   Care Partner:  Caregiver Able to Provide Assistance: Yes  Type of Caregiver Assistance: Physical;Cognitive  Recommendation:  Home Health SLP;24 hour supervision/assistance  Rationale for SLP Follow Up: Maximize cognitive function and independence;Reduce caregiver burden   Equipment: N/A   Reasons for discharge: Treatment goals met;Discharged from hospital   Patient/Family Agrees with Progress Made and Goals Achieved: Yes    [place], [place] 02/06/2019, 3:31 PM

## 2019-02-05 NOTE — Progress Notes (Signed)
Occupational Therapy Session Note  Patient Details  Name: Patrick Pollard MRN: [id number] Date of Birth: 1945-12-19  Today's Date: 02/05/2019 OT Individual Time: 1000-1107 OT Individual Time Calculation (min): 67 min    Short Term Goals: Week 5:  OT Short Term Goal 1 (Week 5): Continue working on established LTGs set at min to mod assist for discharge.   Skilled Therapeutic Interventions/Progress Updates:    Pt worked on bathing and dressing during session with daughter present.  Utilized Stedy throughout session for transfer into and out of the shower as well as for sit to stand with pulling pants over hips.  He needed mod instructional cueing to sequence through the shower, with hand over hand assist for LUE use to wash the right arm.  He was able to complete sit to stand in the shower with use of the rail and maintain standing with min assist while therapist assisted with washing peri area.  Dressing was completed with overall max assist and use of the Stedy to pull up on from the wheelchair for pulling up pants and brief.  Finished session with pt in the wheelchair and SLP in for next session.  Issued HEP for AAROM exercises to the LUE.  Will review next visit tomorrow when daughter will be in for more family education.   Therapy Documentation Precautions:  Precautions Precautions: Fall Precaution Comments: left shoulder subluxation Restrictions Weight Bearing Restrictions: No   Pain: Pain Assessment Pain Scale: Faces Pain Score: 0-No pain ADL: See Care Tool Section for some details of ADL  Therapy/Group: Individual Therapy  Patrick Pollard OTR/L 02/05/2019, 12:36 PM

## 2019-02-05 NOTE — Progress Notes (Signed)
Physical Therapy Session Note  Patient Details  Name: Patrick Pollard MRN: [id number] Date of Birth: [birth year]  Today's Date: 02/05/2019 PT Individual Time: 1130-1230 and 1330-1430 PT Individual Time Calculation (min): 60 min and 60 min  Short Term Goals: Week 5:  PT Short Term Goal 1 (Week 5): = LTG due to ELOS  Skilled Therapeutic Interventions/Progress Updates:    Session 1:  Pt's daughter present for family education this session. Pt propelled w/c to the gym x 150 ft with supervision, therapist educating pt and family on w/c parts management and proper sitting positioning in w/c. Pt performed slideboard transfer from w/c<>mat with CGA from therapist, educating family on techniques. Pt performed slideboard transferred from w/c<>mat with assist from daughter. Pt has a stedy for home use that he has purchased. Therapist educated pt's daughter on how to use the device, how to assist pt with sit to stands and how to ensure pt is positioned within device. Therapist demonstrated stedy transfer w/c<>mat and pt's daughter performed x 2 stedy transfers from w/c<>recliner and recliner<>mat. Therapist educated pt and family on possible scenarios for home. Pt performed bed mobility this session with min assist, therapist educated pt and family on techniques. Pt and daughter educated in HEP exercises: bridges, quad sets and hip flexion AAROM. Pt performed these exercises x 10 each this session. Pt ambulated x 20 ft this session with mod assist from therapist and hemiwalker, therapist educated family that pt will only initially work on gait with the home health therapist only. Therapist educated pt's daughter how to don/doff AFO and shoe, also educated on wearing PRAFO at night. Pt transported back to room and left in w/c with needs in reach and daughter present.   Session 2: Pt seated in w/c upon PT arrival, agreeable to therapy tx and denies pain. Pt's daughter present for continued family education this  session. Pt''s daughter performed stedy transfer with the pt from w/c<>toilet with min cues for set ups and techniques from therapist. Pt performed slideboard transfer from w/c<>bed this session with assist from daughter, therapist encouraging pt to direct transfer to his family member. Pt performed bed mobility with min assist from daughter. Pt transported to the gym. Pt performed slideboard transfer<>w/c mat this session with assist from daughter, performed without cues from therapist. Therapist educated pt and daughter on eventual progression from sit<>stands with stedy to either a cane or RW. Therapist educated pt and family that home health therapist will progress pt with standing and gait as able, not to be done with assist from daughter at home. Pt worked on L LE stance control and weightbearing this session with assist from therapist, R LE stepping in place with UE support on quad cane. Pt transported back to room, slideboard transfer to bed with assist from daughter. Pt left supine in bed at end of session with needs in reach and bed alarm set.    Therapy Documentation Precautions:  Precautions Precautions: Fall Precaution Comments: left shoulder subluxation Restrictions Weight Bearing Restrictions: No    Therapy/Group: Individual Therapy  Cresenciano Genre, PT, DPT 02/05/2019, 7:59 AM

## 2019-02-05 NOTE — Progress Notes (Signed)
Speech Language Pathology Daily Session Note  Patient Details  Name: Juancarlos Lamison MRN: [id number] Date of Birth: August 17, 1946  Today's Date: 02/05/2019 SLP Individual Time: 1107-1130 SLP Individual Time Calculation (min): 23 min  Short Term Goals: Week 5: SLP Short Term Goal 1 (Week 5): STGs=LTGs d/t remaining length of stay  Skilled Therapeutic Interventions:Skilled ST services focused on education. SLP facilitated family education with pt's daughter, primary caregiver, pertaining to executive function impairments focusing on pre-task planning in complex problem solving tasks, utilizing compensatory strategies for complex recall and internal/external strategies for managing selective/alternating attention. Pt likely has attention deficits at baseline, regulating internal distractions and maintaining topic, supported by duaghter. All questions answered to satisfaction. Pt was left in room with call bell within reach, daughter in room and chair alarm set. ST recommends to continue skilled ST services.      Pain Pain Assessment Pain Scale: 0-10 Pain Score: 0-No pain  Therapy/Group: Individual Therapy  Berklee Battey  [facility] 02/05/2019, 12:32 PM

## 2019-02-06 ENCOUNTER — Inpatient Hospital Stay (HOSPITAL_COMMUNITY): Payer: Medicare Other | Admitting: Speech Pathology

## 2019-02-06 ENCOUNTER — Inpatient Hospital Stay (HOSPITAL_COMMUNITY): Payer: Self-pay

## 2019-02-06 ENCOUNTER — Inpatient Hospital Stay (HOSPITAL_COMMUNITY): Payer: Medicare Other | Admitting: Physical Therapy

## 2019-02-06 ENCOUNTER — Inpatient Hospital Stay (HOSPITAL_COMMUNITY): Payer: Medicare Other | Admitting: Occupational Therapy

## 2019-02-06 LAB — GLUCOSE, CAPILLARY
Glucose-Capillary: 151 mg/dL — ABNORMAL HIGH (ref 70–99)
Glucose-Capillary: 122 mg/dL — ABNORMAL HIGH (ref 70–99)
Glucose-Capillary: 101 mg/dL — ABNORMAL HIGH (ref 70–99)

## 2019-02-06 MED ORDER — TAMSULOSIN HCL 0.4 MG PO CAPS: 0.4000 mg | ORAL_CAPSULE | Freq: Every day | ORAL | 0 refills | Status: AC

## 2019-02-06 MED ORDER — PANTOPRAZOLE SODIUM 40 MG PO TBEC: 40.0000 mg | DELAYED_RELEASE_TABLET | Freq: Every day | ORAL | 0 refills | Status: AC

## 2019-02-06 MED ORDER — LISINOPRIL 20 MG PO TABS: 20.0000 mg | ORAL_TABLET | Freq: Every day | ORAL | 0 refills | Status: AC

## 2019-02-06 MED ORDER — METHYLPHENIDATE HCL 10 MG PO TABS: 10.0000 mg | ORAL_TABLET | Freq: Every day | ORAL | 0 refills | Status: DC

## 2019-02-06 MED ORDER — LORAZEPAM 0.5 MG PO TABS: 0.5000 mg | ORAL_TABLET | Freq: Three times a day (TID) | ORAL | 0 refills | Status: DC | PRN

## 2019-02-06 MED ORDER — MELATONIN 3 MG PO TABS: 3.0000 mg | ORAL_TABLET | Freq: Every day | ORAL | 0 refills | Status: DC

## 2019-02-06 MED ORDER — ATORVASTATIN CALCIUM 40 MG PO TABS: 40.0000 mg | ORAL_TABLET | Freq: Every day | ORAL | 0 refills | Status: AC

## 2019-02-06 MED ORDER — METFORMIN HCL 500 MG PO TABS: 250.0000 mg | ORAL_TABLET | Freq: Two times a day (BID) | ORAL | 0 refills | Status: AC

## 2019-02-06 MED ORDER — ACETAMINOPHEN 325 MG PO TABS: 650.0000 mg | ORAL_TABLET | ORAL | Status: AC | PRN

## 2019-02-06 MED ORDER — AMLODIPINE BESYLATE 5 MG PO TABS: 5.0000 mg | ORAL_TABLET | Freq: Every day | ORAL | 0 refills | Status: AC

## 2019-02-06 MED ORDER — POLYETHYLENE GLYCOL 3350 17 G PO PACK: 17.0000 g | PACK | Freq: Two times a day (BID) | ORAL | 0 refills | Status: AC

## 2019-02-06 MED ORDER — VENLAFAXINE HCL ER 150 MG PO CP24: 300.0000 mg | ORAL_CAPSULE | Freq: Every day | ORAL | 0 refills | Status: DC

## 2019-02-06 MED ORDER — ARIPIPRAZOLE 15 MG PO TABS: 7.5000 mg | ORAL_TABLET | Freq: Every day | ORAL | 1 refills | Status: DC

## 2019-02-06 NOTE — Progress Notes (Signed)
Occupational Therapy Discharge Summary  Patient Details  Name: Patrick Pollard MRN: [id number] Date of Birth: March 10, 1946  Today's Date: 02/06/2019 OT Individual Time: 1005-1104 OT Individual Time Calculation (min): 59 min   Session Note:  Pt completed shower with his daughter assisting with use of the Stedy as well as one episode of sit to stand without use of the Nitro.  He needed mod assist for sit to stand in both the [place] and with standing in the shower so his daughter could provide assist with peri washing.  He was able to use the RUE for washing the left arm with max assist hand over hand with daughter assisting.  Dressing was completed from the wheelchair level with min assist for pullover shirt following hemi techniques and max assist for LB dressing to donn pull up pants and to donn TEDs and gripper socks.  Supervision for combing his hair to conclude session.  Discussed shower/tub transfers at home and recommend deferring this until home health can come out and provide hands on education based on setup and need to use the sliding board.  Also educated pt and daughter on need for the drop arm commode over the toilet or out where it can be accessed with the [facility] if it will not fit in the bathroom.  Pt left with daughter present awaiting continued PT education.    Patient has met 12 of 12 long term goals due to improved activity tolerance, improved balance, postural control, ability to compensate for deficits, functional use of  LEFT upper and LEFT lower extremity, improved attention, improved awareness and improved coordination.  Patient to discharge at overall Mod Assist level.  Patient's care partner is independent to provide the necessary physical assistance at discharge.    Reasons goals not met: NA  Recommendation:  Patient will benefit from ongoing skilled OT services in home health setting to continue to advance functional skills in the area of BADL and Reduce care partner burden.   Pt still demonstrates significant hemiparesis and weakness on the left side as well as balance impairments.  In addition, perceptual and sensory deficits are still present, and although improved, they still exhibit an increased impact on all ADL tasks.  Feel he will need extensive OT therapy to further progress ADL function and improve LUE functional use so that he can independence to at least a supervision level.     Equipment: No equipment provided  Reasons for discharge: treatment goals met and discharge from hospital  Patient/family agrees with progress made and goals achieved: Yes  OT Discharge Precautions/Restrictions  Precautions Precautions: Fall Precaution Comments: left shoulder subluxation Restrictions Weight Bearing Restrictions: No   Pain  No report of pain  ADL ADL Eating: Set up Where Assessed-Eating: Wheelchair Grooming: Supervision/safety Where Assessed-Grooming: Wheelchair Upper Body Bathing: Minimal assistance Where Assessed-Upper Body BathingChief Strategy Officer, Chair Lower Body Bathing: Moderate assistance Where Assessed-Lower Body Bathing: Shower, Chair Upper Body Dressing: Minimal assistance Where Assessed-Upper Body Dressing: Wheelchair Lower Body Dressing: Maximal assistance Where Assessed-Lower Body Dressing: Wheelchair Toileting: Maximal assistance Where Assessed-Toileting: Glass blower/designer: Moderate assistance Toilet Transfer Method: Squat pivot Toilet Transfer Equipment: Drop arm bedside commode Tub/Shower Transfer: Moderate assistance Tub/Shower Transfer Method: Administrator, arts: Facilities manager: Maximal Firefighter Method: Radiographer, therapeutic: Gaffer Baseline Vision/History: No visual deficits Patient Visual Report: No change from baseline Vision Assessment?: Yes Ocular Range of Motion: Within Functional Limits Alignment/Gaze Preference:  Within Defined Limits  Tracking/Visual Pursuits: Able to track stimulus in all quads without difficulty Convergence: Within functional limits Visual Fields: No apparent deficits Perception  Perception: Impaired Comments: left inattention Praxis Praxis: Impaired Praxis Impairment Details: Motor planning Praxis-Other Comments: Motor impersistence secondary to sensory deficits Cognition Overall Cognitive Status: Impaired/Different from baseline Arousal/Alertness: Awake/alert Orientation Level: Oriented X4 Attention: Selective Focused Attention: Appears intact Sustained Attention: Appears intact Selective Attention: Impaired Selective Attention Impairment: Functional basic Memory: Impaired Memory Impairment: Decreased recall of new information;Storage deficit Awareness: Impaired Awareness Impairment: Anticipatory impairment Problem Solving: Impaired Problem Solving Impairment: Verbal complex;Functional complex Sequencing: Impaired Sequencing Impairment: Functional basic;Functional complex Sensation Sensation Light Touch: Impaired Detail Light Touch Impaired Details: Impaired LUE;Impaired LLE Hot/Cold: Impaired Detail Hot/Cold Impaired Details: Absent LLE;Absent LUE Proprioception: Impaired Detail Proprioception Impaired Details: Impaired LUE;Impaired LLE Stereognosis: Impaired Detail Stereognosis Impaired Details: Absent LUE Coordination Gross Motor Movements are Fluid and Coordinated: No Fine Motor Movements are Fluid and Coordinated: No Coordination and Movement Description: Pt still with significant LUE and LLE hemiparesis.  He does exhibit some functional use of the L hand for selfcare tasks at a stabilizer with supervision but needs max hand over hand for using the LUE to wash the right arm.   Motor  Motor Motor: Hemiplegia Motor - Discharge Observations: Still with significant left hemiparesis in the UE and LE. Mobility  Bed Mobility Bed Mobility: Rolling  Right;Rolling Left;Supine to Sit;Sit to Supine Rolling Right: Minimal Assistance - Patient > 75% Rolling Left: Contact Guard/Touching assist Left Sidelying to Sit: Supervision/Verbal cueing Sit to Supine: Moderate Assistance - Patient 50-74% Transfers Sit to Stand: Moderate Assistance - Patient 50-74%;Minimal Assistance - Patient > 75%  Trunk/Postural Assessment  Cervical Assessment Cervical Assessment: Exceptions to WFL(cervical protraction with rotation to the right) Thoracic Assessment Thoracic Assessment: Exceptions to WFL(thoracic rounding with right trunk shortening and left trunk passive elongation) Lumbar Assessment Lumbar Assessment: Exceptions to WFL(pt with increased posterior pelvic tilt along with increased weightbearing over the left hip with pelvic tilt to the left)  Balance Balance Balance Assessed: Yes Static Sitting Balance Static Sitting - Balance Support: Feet supported Static Sitting - Level of Assistance: 5: Stand by assistance Dynamic Sitting Balance Dynamic Sitting - Balance Support: During functional activity Dynamic Sitting - Level of Assistance: 5: Stand by assistance Static Standing Balance Static Standing - Balance Support: Right upper extremity supported;During functional activity Static Standing - Level of Assistance: 4: Min assist Dynamic Standing Balance Dynamic Standing - Balance Support: During functional activity;Bilateral upper extremity supported Dynamic Standing - Level of Assistance: 3: Mod assist Extremity/Trunk Assessment RUE Assessment RUE Assessment: Within Functional Limits LUE Assessment LUE Assessment: Exceptions to [facility] Strength Comments: Pt with gross digit flexion 90% and digit extension 75% with wrist in neutral position.  He exhibits synergy pattern with attempted elbow and shoulder flexion at Brunnstrum stage III movement.  Max hand over hand assist for use as an active assist with bathing tasks.  He can use is as a  stabilizer with supervision and cueing.   LUE Body System: Neuro Brunstrum levels for arm and hand: Arm;Hand Brunstrum level for arm: Stage III Synergy is performed voluntarily Brunstrum level for hand: Stage V Independence from basic synergies   Amiah Frohlich OTR/L 02/06/2019, 4:33 PM

## 2019-02-06 NOTE — Progress Notes (Signed)
[facility] PHYSICAL MEDICINE & REHABILITATION PROGRESS NOTE   Subjective/Complaints:  Feels good, slept well Discussed day of D/C schedule, no further family training needed per PT  ROS: Patient denies CP, SOB, N/V/D   Objective:   No results found. No results for input(s): WBC, HGB, HCT, PLT in the last 72 hours. No results for input(s): NA, K, CL, CO2, GLUCOSE, BUN, CREATININE, CALCIUM in the last 72 hours.  Intake/Output Summary (Last 24 hours) at 02/06/2019 0951 Last data filed at 02/06/2019 0445 Gross per 24 hour  Intake 240 ml  Output 1634 ml  Net -1394 ml     Physical Exam: Vital Signs Blood pressure 119/76, pulse 99, temperature 98 F (36.7 C), temperature source Oral, resp. rate 18, height 6' 1"  (1.854 m), weight 97 kg, SpO2 92 %. Constitutional: No distress . Vital signs reviewed. HEENT: EOMI, oral membranes moist Neck: supple Cardiovascular: RRR without murmur. No JVD    Respiratory: CTA Bilaterally without wheezes or rales. Normal effort    GI: BS +, non-tender, non-distended  Musc: No edema or tenderness in extremities. Neuro: Alert Dysarthria Motor:  3- Left upper extremity:handgrip, 3- biceps and 2- triceps  3-/5, Left lower extremity: 1/5 hip flexion, 2/5 distally plantar flexion--no ADF Sensation absent to LT and proprioception, unchanged Skin:intact Psych: without lability or agitation   Assessment/Plan: 1. 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  Physiatrist is providing close team supervision and 24 hour management of active medical problems listed below.  Physiatrist and rehab team continue to assess barriers to discharge/monitor patient progress toward functional and medical goals  Care Tool:  Bathing    Body parts bathed by patient: Left arm, Chest, Abdomen, Right upper leg, Left upper leg, Right lower leg, Face, Front  perineal area, Left lower leg   Body parts bathed by helper: Right upper leg, Buttocks Body parts n/a: Left upper leg, Right upper leg, Right lower leg, Left lower leg, Front perineal area, Buttocks   Bathing assist Assist Level: Moderate Assistance - Patient 50 - 74%     Upper Body Dressing/Undressing Upper body dressing   What is the patient wearing?: Pull over shirt    Upper body assist Assist Level: Maximal Assistance - Patient 25 - 49%    Lower Body Dressing/Undressing Lower body dressing      What is the patient wearing?: Pants, Incontinence brief     Lower body assist Assist for lower body dressing: Maximal Assistance - Patient 25 - 49%     Toileting Toileting    Toileting assist Assist for toileting: Maximal Assistance - Patient 25 - 49%     Transfers Chair/bed transfer  Transfers assist  Chair/bed transfer activity did not occur: Safety/medical concerns  Chair/bed transfer assist level: Contact Guard/Touching assist     Locomotion Ambulation   Ambulation assist   Ambulation activity did not occur: Safety/medical concerns  Assist level: Moderate Assistance - Patient 50 - 74% Assistive device: Walker-hemi Max distance: 20 ft   Walk 10 feet activity   Assist  Walk 10 feet activity did not occur: Safety/medical concerns  Assist level: Moderate Assistance - Patient - 50 - 74% Assistive device: Walker-hemi   Walk 50 feet activity   Assist Walk 50 feet with 2 turns activity did not occur: Safety/medical concerns  Assist level: 2 helpers      Walk 150 feet activity   Assist Walk 150 feet activity  did not occur: Safety/medical concerns         Walk 10 feet on uneven surface  activity   Assist Walk 10 feet on uneven surfaces activity did not occur: Safety/medical concerns         Wheelchair     Assist Will patient use wheelchair at discharge?: Yes Type of Wheelchair: Manual Wheelchair activity did not occur: Safety/medical  concerns  Wheelchair assist level: Supervision/Verbal cueing Max wheelchair distance: 61'    Wheelchair 50 feet with 2 turns activity    Assist    Wheelchair 50 feet with 2 turns activity did not occur: Safety/medical concerns   Assist Level: Supervision/Verbal cueing   Wheelchair 150 feet activity     Assist Wheelchair 150 feet activity did not occur: Safety/medical concerns   Assist Level: Minimal Assistance - Patient > 75%    Medical Problem List and Plan: 1.Left side weakness with facial droopas well as left hemi-sensory losssecondary to right thalamic hemorrhage with IVH secondary to hypertensive crisis  Team conference today please see physician documentation under team conference tab, met with team face-to-face to discuss problems,progress, and goals. Formulized individual treatment plan based on medical history, underlying problem and comorbidities. Family training prior to discharge on 02/07/2019 2. Antithrombotics: -DVT/anticoagulation:SCDs -antiplatelet therapy: N/A 3. Pain Management:Tylenol as needed 4. Mood:Abilify 5 mg daily, Effexor 300 mg daily. Provide emotional support appreciate psychiatry note  doing well with herbal supplements     Prn ativan  -antipsychotic agents: Abilify  Major depressive disorder 5. Neuropsych: This patientiscapable of making decisions on hisown behalf. 6. Skin/Wound Care:Routine skin checks 7. Fluids/Electrolytes/Nutrition:Routine ins and outs   Poor p.o. intake- Remeron started on 4/3 --intake excellent   8.  Essential hypertension  Cont Lisinopril 20 mg daily,   Maxide 37.5-25 mg daily DC'd on 4/4 due to hyponatremia  Monitor with increased mobility Vitals:   02/05/19 1952 02/06/19 0442  BP: 104/64 119/76  Pulse: 93 99  Resp: 16 18  Temp: 98 F (36.7 C) 98 F (36.7 C)  SpO2: 96% 92%   Amlodipine increased to 67m on 3/29  Controlled on 4/29,  9. BPH. Flomax 0.4 mg daily,  removed  Foley  10. History of GI bleed . Continue Protonix 11. Hyperlipidemia. Resume Lipitor on discharge. 12.  Constipation-bowel meds increased on 3/30, again on 3/31, again on 4/1     13.  Prediabetes although CBGs have been consistently elevated post CVA likely due to decreased activity  Diet changed to carb modified   SSI ordered on 4/2, will likely not need at home  Appreciate dietary consult   CBG (last 3)  Recent Labs    02/05/19 1653 02/05/19 2110 02/06/19 0622  GLUCAP 106* 146* 151*  increasing add low dose metformin [id number] started 4/22, well controlled on 4/29 14.  Hyponatremia  Sodium 126 on 4/4, 129 on 4/6, asymptomatic  Improved to 131 4/10  Continue to monitor 15.  Sleep disturbance-overall improved discontinue Remeron - ritalin 1020min am to help with am somnolence, the patient is overall doing better with this however he still is not satisfied with his quality of sleep. Pt took melatonin and drank camomile teaECG on 3/24     trazodone 50 started on 3/30, DC'd on 4/3   Sleep chart ordered  -consider low dose seroquel for sleep 16. Renal:    Creatinine 0.88 on 4/4  Off IVF  Encourage fluids    17.  Leukocytosis- WBCs 12.5 on 4/3, 13.8 on 4/6,  remains afebrile     Continue to monitor 18.  Bladder issues - urgency likely multifactorial  CVA Meds , prostate,   -stopped urecholine, seems to be emptying well now but still has CVA related urgency, would avoid anticholinergics, may consider Myrbetriq (B3 adrenergic agonist)   LOS: 33 days A FACE TO FACE EVALUATION WAS PERFORMED  Charlett Blake 02/06/2019, 9:51 AM

## 2019-02-06 NOTE — Discharge Summary (Signed)
Physician Discharge Summary  Patient ID: Patrick Pollard MRN: [id number] DOB/AGE: 74-21-1947 74 y.o.  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 Active Problems:   MDD (major depressive disorder), recurrent episode, moderate (HCC)   Left-sided intracerebral hemorrhage (HCC)   Sleep disturbance   Hyponatremia   Prediabetes   Slow transit constipation   Essential hypertension   Labile blood pressure   Severe episode of recurrent major depressive disorder, without psychotic features (HCC)   AKI (acute kidney injury) (HCC)   Colonic mass   Leukocytosis   MDD (major depressive disorder), single episode, moderate (HCC)   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 EXAM: CT ABDOMEN AND PELVIS WITHOUT CONTRAST TECHNIQUE: Multidetector CT imaging of the abdomen and pelvis was performed following the standard protocol without IV contrast. COMPARISON:  CT scan of December 31, 2017. FINDINGS: Lower chest: Left lower lobe atelectasis or pneumonia is noted. Hepatobiliary: No focal liver abnormality is seen. No gallstones, gallbladder wall thickening, or biliary dilatation. Pancreas: Unremarkable. No pancreatic ductal dilatation or surrounding inflammatory changes. Spleen: Normal in size without focal abnormality. Adrenals/Urinary Tract: Adrenal glands are unremarkable. Kidneys are normal, without renal calculi, focal lesion, or hydronephrosis. Bladder is unremarkable. Stomach/Bowel: The stomach appears normal. There is no evidence of bowel obstruction or inflammation. The appendix is not visualized. Vascular/Lymphatic: Aortic atherosclerosis. No enlarged abdominal or pelvic lymph  nodes. Reproductive: Stable mild prostatic enlargement is noted. Other: No abdominal wall hernia or abnormality. No abdominopelvic ascites. Musculoskeletal: No acute or significant osseous findings. IMPRESSION: Left lower lobe atelectasis or pneumonia. No acute abnormality seen in the abdomen or pelvis. Stable mild prostatic enlargement. Aortic Atherosclerosis (ICD10-I70.0). Electronically Signed   By: Lupita RaiderJames  Green Jr, M.D.   On: 01/10/2019 19:33    Labs:  Basic Metabolic Panel: No results for input(s): NA, K, CL, CO2, GLUCOSE, BUN, CREATININE, CALCIUM, MG, PHOS in the last 168 hours.  CBC: No results for input(s): WBC, NEUTROABS, HGB, HCT, MCV, PLT in the last 168 hours.  CBG: Recent Labs  Lab 02/06/19 0622 02/06/19 1149 02/06/19 1709 02/06/19 2108 02/07/19 0624  GLUCAP 151* 122* 111* 101* 140*    Family history. Noted history of hypertension and CAD denies any cancer  Brief HPI:    Patrick Pollard is a 73 year old right-handed male with history of GI bleed due to polyp status post resection 2018, hypertension maintained on Maxide, depression on Abilify, Effexor or as well as Wellbutrin. Lives with spouse independent prior to admission. Presented 01/01/2019 with acute onset of left-sided weakness slurred speech and facial droop while in the bathroom. Blood pressure 155 systolic. Denied any chest pain or shortness of breath. Cranial CT scan showed a 3.1 x 2.6 x 2.3 cm acute hemorrhage in the right lateral thalamus posterior limb internal capsule mild surrounding edema. CTA angiogram of head and neck negative for vascular malformation. 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 Echocardiogram with ejection fraction of 60% normal systolic function. Initially placed on Cardene for blood pressure control. Therapy evaluations completed and patient was admitted for a comprehensive rehabilitation program  Hospital Course: Patrick Pollard was admitted to rehab 01/04/2019 for inpatient  therapies to consist of PT, ST and OT at least three hours five days a week. Past  admission physiatrist, therapy team and rehab RN have worked together to provide customized collaborative inpatient rehab. Pertaining to patient right thalamic hemorrhage with IVH secondary to hypertensive crisis blood pressures monitored follow-up neurology services. SCDs for DVT prophylaxis. Mood stabilization presently on Abilify and Ativan as needed as well as scheduled effexor. Ritalin was added to patient's regimen to help sustain better attention to task with good results. Blood pressures controlled and monitored on lisinopril as well as Norvasc and patient would follow-up with PCP. Maxide was discontinued due to some mild hyponatremia. Pre-diabetes initially on sliding scale insulin and low dose Glucophage added with diabetic teaching. BPH voiding well with Flomax.  Physical exam. Blood pressure 139/92 pulse 79 temperature 97.9 respirations 22 oxygen saturation 91% room air Constitutional. Alert and oriented well-developed HEENT Head. Normocephalic and atraumatic Eyes. Pupils round and reactive to light without discharge EOMs normal Neck. Normal range of motion no tracheal deviation no thyromegaly without JVD Cardiovascular normal rate and rhythm no friction rub or murmur heard Respiratory effort normal no respiratory distress without wheeze or rail GI. Soft no distention nontender without rebound Musculoskeletal. Gen. No edema Neurological. Alert and oriented dysarthric speech follow commands he was a bit distracted fair awareness of deficits. Left upper extremity 1-2 out of 5 proximal to distal left lower extremity 0 to trace out of 5 right upper extremity right lower extremity 4+ out of 5 early flexor tone in left wrist extensor tone left heel  Rehab course: During patient's stay in rehab weekly team conferences were held to monitor patient's progress, set goals and discuss barriers to discharge. At admission,  patient required moderate assist for rolling, needed assist overall for transfers, +2 physical assist sit to stand.mmoderate assist upper body bathing max assist lower body bathing moderate assist upper body dressing max assist lower body dressing total assist toilet transfers  He  has had improvement in activity tolerance, balance, postural control as well as ability to compensate for deficits. He/She has had improvement in functional use RUE/LUE  and RLE/LLE as well as improvement in awareness.propel wheelchair to the gym supervision. Provided education with family and patient wheelchair parts and management. Perform sliding board transfers wheelchair to mat with contact guard assist from therapist. Perform sliding board transfer wheelchair to mat with assistance from daughter. Performed bed mobility minimal assistance with ongoing family teaching. Patient was wearing a PRAFO at night. Work with bathing and dressing with occupational therapy with family. Needed cuing to sequence through a shower. He was able to complete sit to stand in the shower with the use of rail and maintain standing with minimal assistance of therapies. Full family teaching was completed plan discharge to home       Disposition: Discharge disposition: 01-Home or Self Care     discharged to home   Diet: diabetic diet  Special Instructions:no driving smoking or alcohol  Medications at discharge. 1. Tylenol as needed 2. Norvasc 5 mg daily 3. Abilify 7.5 mg daily 4. Lisinopril 20 mg by mouth daily 5. Ativan 0.5 mg every 8 hours as needed anxiety 6. Melatonin 3 mg daily at bedtime 7. Ritalin 10 mg daily 8. Protonix 40 mg daily at bedtime 9. MiraLAX twice daily hold for loose stools 10 Senokot-S 2 tablets bedtime hold for loose stool 11. Flomax 0.4 mg daily at bedtime 12. Effexor 300 mg daily   Discharge Instructions    Ambulatory referral to Neurology   Complete by:  As directed    An appointment is requested  in approximately follow-up 4 weeks right thalamic hemorrhage   Ambulatory referral to Physical Medicine Rehab   Complete by:  As directed    Moderate complexity follow-up 1-2 weeks right thalamic infarction      Follow-up Information    Kirsteins, Victorino Sparrow, MD Follow up.   Specialty:  Physical Medicine and Rehabilitation Why:  office to call for appointment Contact information: [address] [place] [address] [phone]           Signed: Charlton Amor 02/07/2019, 6:33 AM

## 2019-02-06 NOTE — Progress Notes (Signed)
Physical Therapy Discharge Summary  Patient Details  Name: Patrick Pollard MRN: [id number] Date of Birth: 1946/08/14  Patient has met 8 of 8 long term goals due to improved activity tolerance, improved balance, improved postural control, increased strength, ability to compensate for deficits, functional use of  left upper extremity and left lower extremity and improved attention.  Patient to discharge at a wheelchair level [place].   Patient's care partner is independent to provide the necessary physical assistance at discharge. Pt's daughter has performed hands on training with slideboard transfers, car transfers and sit<>stands.   All goals met.   Recommendation:  Patient will benefit from ongoing skilled PT services in home health setting to continue to advance safe functional mobility, address ongoing impairments in balance, L sided hemiparesis, gait, functional mobility, and minimize fall risk.  Equipment: 18x18 w/c, slideboard and hospital bed  Reasons for discharge: treatment goals met  Patient/family agrees with progress made and goals achieved: Yes  PT Discharge Precautions/Restrictions Precautions Precautions: Fall Precaution Comments: left shoulder subluxation Restrictions Weight Bearing Restrictions: No Pain Pain Assessment Pain Scale: 0-10 Pain Score: 0-No pain Cognition Overall Cognitive Status: Impaired/Different from baseline Arousal/Alertness: Awake/alert Orientation Level: Oriented X4 Attention: Selective Focused Attention: Appears intact Selective Attention: Impaired Memory: Appears intact Awareness: Appears intact Sensation Sensation Light Touch: Impaired Detail Light Touch Impaired Details: Impaired LUE;Impaired LLE Proprioception: Impaired Detail Proprioception Impaired Details: Impaired LUE;Impaired LLE Coordination Gross Motor Movements are Fluid and Coordinated: No Fine Motor Movements are Fluid and Coordinated: No Coordination and Movement  Description: coordination impaired secondary to L hemiparesis Motor  Motor Motor: Hemiplegia Motor - Discharge Observations: L hemiparesis  Mobility Bed Mobility Bed Mobility: Rolling Right;Rolling Left;Supine to Sit;Sit to Supine Rolling Right: Minimal Assistance - Patient > 75% Rolling Left: Minimal Assistance - Patient > 75% Supine to Sit: Minimal Assistance - Patient > 75% Sit to Supine: Minimal Assistance - Patient > 75% Transfers Transfers: Sit to Stand;Lateral/Scoot Transfers Sit to Stand: Moderate Assistance - Patient 50-74%;Minimal Assistance - Patient > 75% Lateral/Scoot Transfers: Minimal Assistance - Patient > 75%;Contact Guard/Touching assist Transfer (Assistive device): Other (Comment)(slideboard) Locomotion  Gait Ambulation: Yes Gait Assistance: Moderate Assistance - Patient 50-74% Gait Distance (Feet): 30 Feet Assistive device: Hemi-walker Gait Assistance Details: Tactile cues for weight shifting;Verbal cues for technique;Verbal cues for precautions/safety;Manual facilitation for weight shifting Gait Gait: Yes Gait Pattern: Impaired Gait Pattern: Step-to pattern;Decreased step length - right;Decreased hip/knee flexion - left;Decreased weight shift to left;Left flexed knee in stance;Trunk flexed Stairs / Additional Locomotion Stairs: No Wheelchair Mobility Wheelchair Mobility: Yes Wheelchair Assistance: Chartered loss adjuster: Right upper extremity;Right lower extremity Wheelchair Parts Management: Needs assistance Distance: 150 ft  Trunk/Postural Assessment  Cervical Assessment Cervical Assessment: Exceptions to WFL(forward head posture) Thoracic Assessment Thoracic Assessment: Exceptions to WFL(rounded shoulders) Lumbar Assessment Lumbar Assessment: Exceptions to WFL(posterior pelvic tilt) Postural Control Postural Control: Deficits on evaluation  Balance Balance Balance Assessed: Yes Static Sitting Balance Static Sitting -  Balance Support: Feet supported Static Sitting - Level of Assistance: 5: Stand by assistance Dynamic Sitting Balance Dynamic Sitting - Level of Assistance: 5: Stand by assistance Static Standing Balance Static Standing - Level of Assistance: 4: Min assist Dynamic Standing Balance Dynamic Standing - Level of Assistance: 3: Mod assist Extremity Assessment  RLE Assessment RLE Assessment: Within Functional Limits LLE Assessment LLE Assessment: Exceptions to [facility] Passive Range of Motion (PROM) Comments: limited DF LLE Strength Left Hip Flexion: 2-/5 Left Hip Extension: 2+/5 Left Knee Flexion: 1/5 Left Knee Extension:  2-/5 Left Ankle Dorsiflexion: 0/5 Left Ankle Plantar Flexion: 0/5    Netta Corrigan, PT, DPT 02/06/2019, 10:51 AM   Page Spiro, PT, DPT 02/07/2019 6:17 PM

## 2019-02-06 NOTE — Progress Notes (Signed)
Physical Therapy Session Note  Patient Details  Name: Patrick Pollard MRN: [id number] Date of Birth: [birth year]  Today's Date: 02/06/2019 PT Individual Time: 0900-0930 PT Individual Time Calculation (min): 30 min   Short Term Goals: Week 5:  PT Short Term Goal 1 (Week 5): = LTG due to ELOS  Skilled Therapeutic Interventions/Progress Updates: Pt presented in bed agreeable to therapy. Pt noted to not have any clothes, PTA obtained paper scrubs, with session focusing on dressing. PTA donned TED hose total A, threaded pants maxA with pt able to perform modified bridge to complete pulling pants over hips. Performed supine to sit with HOB elevated min/modA. Pt donned shirt modA. Performed SB transfer with set up and CGA with min verbal cues for head hips relationship. Pt set up in w/c and left with call bell within reach and needs met.      Therapy Documentation Precautions:  Precautions Precautions: Fall Precaution Comments: left shoulder subluxation Restrictions Weight Bearing Restrictions: No General:   Vital Signs: Therapy Vitals Temp: 97.9 F (36.6 C) Temp Source: Oral Pulse Rate: 87 Resp: 14 BP: (!) 92/57 Patient Position (if appropriate): Lying Oxygen Therapy SpO2: 93 % O2 Device: Room Air Pain:     Therapy/Group: Individual Therapy  Fateh Kindle  Fleda Pagel, PTA  02/06/2019, 4:09 PM

## 2019-02-06 NOTE — Progress Notes (Signed)
Physical Therapy Session Note  Patient Details  Name: Patrick Pollard MRN: [id number] Date of Birth: 1946-03-26  Today's Date: 02/06/2019 PT Individual Time: 1130-1240 and 1530-1630 PT Individual Time Calculation (min): 70 min and 60 min  Short Term Goals: Week 5:  PT Short Term Goal 1 (Week 5): = LTG due to ELOS  Skilled Therapeutic Interventions/Progress Updates:    Session 1: Pt seated in w/c upon PT arrival, agreeable to therapy tx and denies pain. Pt's daughter present for continued family education. Pt performed sit<>stand from w/c with UE support on the sink, min assist. Min assist for standing balance while therapist swapped out w/c. Pt seated in his w/c for home to assess fit and educate pt and family on w/c parts. Pt propelled w/c x 150 ft with supervision. Pt performed x1 sit<>stand at the sink with assist from therapist, educating family on techniques. Pt performed sit<>stand from w/c with R UE support on sink and assist from his daughter. Pt performed car transfer this session with min assist and slideboard from therapist. Pt performed car transfer with assist from daughter this session, min cues for techniques. Therapist provided pt and family with education handouts on slideboard transfers bed<>chair, slideboard car transfer, HEP (bridges, quad set and AAROM hip flexion), and for positioning. Pt transported back to room and left in w/c with needs in reach and daughter present.   Session 2: Pt supine in bed upon PT arrival, agreeable to therapy tx and denies pain. Pt transferred from supine>sitting EOB with use of bed features and bedrails with supervision. Pt performed slideboard transfer from bed>w/c with CGA and cues for techniques. Pt performed w/c propulsion this session with supervision x 150 ft using a R hemi propulsion technique. Pt ambulated x 30 ft this session with hemiwalker and mod assist, cues for techniques and sequencing, pt with improved L stance control noted and quad  activation. Pt ascended/descended 1 step this session with R rail and mod assist, step to pattern with cues for techniques and sequencing. Pt ambulated x 20 ft with +2 assist, 3 musketeer, cues for increased step length and symmetric step length bilaterally. Pt ambulated x 20 ft with hemi walker and mod assist, cues for step length and sequencing. Pt transported back to room and left in w/c with needs in reach and chair alarm set.    Therapy Documentation Precautions:  Precautions Precautions: Fall Precaution Comments: left shoulder subluxation Restrictions Weight Bearing Restrictions: No    Therapy/Group: Individual Therapy  Cresenciano Genre, PT, DPT 02/06/2019, 7:57 AM

## 2019-02-07 ENCOUNTER — Inpatient Hospital Stay (HOSPITAL_COMMUNITY): Payer: Self-pay | Admitting: Physical Therapy

## 2019-02-07 LAB — GLUCOSE, CAPILLARY
Glucose-Capillary: 111 mg/dL — ABNORMAL HIGH (ref 70–99)
Glucose-Capillary: 140 mg/dL — ABNORMAL HIGH (ref 70–99)
Glucose-Capillary: 117 mg/dL — ABNORMAL HIGH (ref 70–99)

## 2019-02-07 NOTE — Progress Notes (Signed)
Physical Therapy Session Note  Patient Details  Name: Patrick Pollard MRN: [id number] Date of Birth: Oct 26, 1945  Today's Date: 02/07/2019 PT Individual Time: 1233-1313 PT Individual Time Calculation (min): 40 min   Short Term Goals: Week 5:  PT Short Term Goal 1 (Week 5): = LTG due to ELOS  Skilled Therapeutic Interventions/Progress Updates:   Pt received supine in bed with nursing staff present and pt agreeable to therapy session. Therapist donned bilateral shoes total assist for time management. Pt performed supine to sit, HOB partially elevated and using bedrails, with min assist for trunk upright. Pt performed sit<>stand from EOB to R HHA to complete LB dressing with mod assist for lifting and total assist for clothing management. Pt performed R lateral scoot transfer using transfer board EOB to w/c with CGA for steadying at trunk. Pt transported in w/c to [place] tower entrance to meet pt's daughter for car transfer. Pt's daughter set-up slide board transfer w/c to car with min/mod cuing for proper sequencing and set-up. Pt's daughter provided min assist for pt to transfer w/c to car using transfer board with intermittent min assist for readjusting w/c from therapist. Therapist reinforced set-up and sequencing of car transfer to pt's daughter for improved recall. Ended session with pt safely in car with family for discharge.  Therapy Documentation Precautions:  Precautions Precautions: Fall Precaution Comments: left shoulder subluxation Restrictions Weight Bearing Restrictions: No  Pain:  No reports of pain during session.    Therapy/Group: Individual Therapy  Ginny Forth, PT, DPT 02/07/2019, 5:45 PM

## 2019-02-07 NOTE — Progress Notes (Signed)
[facility] PHYSICAL MEDICINE & REHABILITATION PROGRESS NOTE   Subjective/Complaints:  No new concerns today.  Discussed need to f/u with PCP  ROS: Patient denies CP, SOB, N/V/D   Objective:   No results found. No results for input(s): WBC, HGB, HCT, PLT in the last 72 hours. No results for input(s): NA, K, CL, CO2, GLUCOSE, BUN, CREATININE, CALCIUM in the last 72 hours.  Intake/Output Summary (Last 24 hours) at 02/07/2019 0847 Last data filed at 02/07/2019 0500 Gross per 24 hour  Intake -  Output 1100 ml  Net -1100 ml     Physical Exam: Vital Signs Blood pressure 107/79, pulse 93, temperature 98 F (36.7 C), temperature source Oral, resp. rate 15, height 6\' 1"  (1.854 m), weight 97 kg, SpO2 94 %. Constitutional: No distress . Vital signs reviewed. HEENT: EOMI, oral membranes moist Neck: supple Cardiovascular: RRR without murmur. No JVD    Respiratory: CTA Bilaterally without wheezes or rales. Normal effort    GI: BS +, non-tender, non-distended  Musc: No edema or tenderness in extremities. Neuro: Alert Dysarthria Motor:  3- Left upper extremity:handgrip, 3- biceps and 2- triceps  3-/5, Left lower extremity: 1/5 hip flexion, 2/5 distally plantar flexion--no ADF Sensation absent to LT and proprioception, unchanged Skin:intact Psych: without lability or agitation   Assessment/Plan: 1. 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 Stable for D/C today F/u PCP in 3-4 weeks F/u PM&R 2 weeks See D/C summary See D/C instructions Care Tool:  Bathing    Body parts bathed by patient: Left arm, Chest, Abdomen, Right upper leg, Left upper leg, Right lower leg, Face, Front perineal area, Left lower leg   Body parts bathed by helper: Buttocks, Right arm Body parts n/a: Left upper leg, Right upper leg, Right lower leg, Left lower leg, Front perineal area, Buttocks   Bathing assist Assist Level: Moderate Assistance - Patient 50 - 74%     Upper Body  Dressing/Undressing Upper body dressing   What is the patient wearing?: Pull over shirt    Upper body assist Assist Level: Minimal Assistance - Patient > 75%    Lower Body Dressing/Undressing Lower body dressing      What is the patient wearing?: Pants, Incontinence brief     Lower body assist Assist for lower body dressing: Maximal Assistance - Patient 25 - 49%     Toileting Toileting    Toileting assist Assist for toileting: Maximal Assistance - Patient 25 - 49%     Transfers Chair/bed transfer  Transfers assist  Chair/bed transfer activity did not occur: Safety/medical concerns  Chair/bed transfer assist level: Contact Guard/Touching assist     Locomotion Ambulation   Ambulation assist   Ambulation activity did not occur: Safety/medical concerns  Assist level: Moderate Assistance - Patient 50 - 74% Assistive device: Walker-hemi Max distance: 30 ft   Walk 10 feet activity   Assist  Walk 10 feet activity did not occur: Safety/medical concerns  Assist level: Moderate Assistance - Patient - 50 - 74% Assistive device: Walker-hemi   Walk 50 feet activity   Assist Walk 50 feet with 2 turns activity did not occur: Safety/medical concerns  Assist level: 2 helpers      Walk 150 feet activity   Assist Walk 150 feet activity did not occur: Safety/medical concerns         Walk 10 feet on uneven surface  activity   Assist Walk 10 feet on uneven surfaces activity did not occur: Safety/medical concerns  Wheelchair     Assist Will patient use wheelchair at discharge?: Yes Type of Wheelchair: Manual Wheelchair activity did not occur: Safety/medical concerns  Wheelchair assist level: Supervision/Verbal cueing Max wheelchair distance: 150 ft    Wheelchair 50 feet with 2 turns activity    Assist    Wheelchair 50 feet with 2 turns activity did not occur: Safety/medical concerns   Assist Level: Supervision/Verbal cueing    Wheelchair 150 feet activity     Assist Wheelchair 150 feet activity did not occur: Safety/medical concerns   Assist Level: Supervision/Verbal cueing    Medical Problem List and Plan: 1.Left side weakness with facial droopas well as left hemi-sensory losssecondary to right thalamic hemorrhage with IVH secondary to hypertensive crisis   Stable for d/c  2. Antithrombotics: -DVT/anticoagulation:SCDs -antiplatelet therapy: N/A 3. Pain Management:Tylenol as needed 4. Mood:Abilify 5 mg daily, Effexor 300 mg daily. Provide emotional support appreciate psychiatry note  doing well with herbal supplements     Prn ativan  -antipsychotic agents: Abilify  Major depressive disorder 5. Neuropsych: This patientiscapable of making decisions on hisown behalf. 6. Skin/Wound Care:Routine skin checks 7. Fluids/Electrolytes/Nutrition:Routine ins and outs   Poor p.o. intake- Remeron started on 4/3 --intake excellent   8.  Essential hypertension  Cont Lisinopril 20 mg daily,   Maxide 37.5-25 mg daily DC'd on 4/4 due to hyponatremia  Monitor with increased mobility Vitals:   02/06/19 2000 02/07/19 0457  BP: 105/78 107/79  Pulse: 86 93  Resp: 18 15  Temp: 98.2 F (36.8 C) 98 F (36.7 C)  SpO2: 98% 94%   Amlodipine increased to 5mg  on 3/29  Controlled on 4/30  9. BPH. Flomax 0.4 mg daily, removed  Foley  10. History of GI bleed . Continue Protonix 11. Hyperlipidemia. Resume Lipitor on discharge. 12.  Constipation-bowel meds increased on 3/30, again on 3/31, again on 4/1     13.  Prediabetes although CBGs have been consistently elevated post CVA likely due to decreased activity  Diet changed to carb modified   SSI ordered on 4/2, will likely not need at home  Appreciate dietary consult   CBG (last 3)  Recent Labs    02/06/19 1709 02/06/19 2108 02/07/19 0624  GLUCAP 111* 101* 140*  increasing add low dose metformin 250mg  BID started 4/22,  well controlled on 4/30 14.  Hyponatremia  Sodium 126 on 4/4, 129 on 4/6, asymptomatic  Improved to 131 4/10  Continue to monitor 15.  Sleep disturbance-overall improved discontinue Remeron - ritalin 10mg   in am to help with am somnolence, the patient is overall doing better with this however he still is not satisfied with his quality of sleep. Pt took melatonin and drank camomile teaECG on 3/24     trazodone 50 started on 3/30, DC'd on 4/3   Sleep chart ordered  -consider low dose seroquel for sleep 16. Renal:    Creatinine 0.88 on 4/4  Off IVF  Encourage fluids    17.  Leukocytosis- WBCs 12.5 on 4/3, 13.8 on 4/6, remains afebrile     Continue to monitor 18.  Bladder issues - urgency likely multifactorial  CVA Meds , prostate,   -stopped urecholine, seems to be emptying well now but still has CVA related urgency, would avoid anticholinergics, may consider Myrbetriq (B3 adrenergic agonist)   LOS: 34 days A FACE TO FACE EVALUATION WAS PERFORMED  Erick Colace 02/07/2019, 8:47 AM

## 2019-02-08 NOTE — Patient Care Conference (Signed)
Inpatient RehabilitationTeam Conference and Plan of Care Update Date: 02/06/2019   Time: 11:00 AM    Patient Name: Patrick Pollard      Medical Record Number: [id number]  Date of Birth: Dec 03, 1945 Sex: Male         Room/Bed: 4W19C/4W19C-01 Payor Info: Payor: [employer] MEDICARE / Plan: [facility] MEDICARE / Product Type: *No Product type* /    Admitting Diagnosis: ICH  Admit Date/Time:  01/04/2019  3:36 PM Admission Comments: No comment available   Primary Diagnosis:  ICH (intracerebral hemorrhage) (HCC) Principal Problem: ICH (intracerebral hemorrhage) (HCC)  Patient Active Problem List   Diagnosis Date Noted  . 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 Leukocytosis   . MDD (major depressive disorder), single episode, moderate (HCC)   . AKI (acute kidney injury) (HCC)   . Colonic mass   . Severe episode of recurrent major depressive disorder, without psychotic features (HCC)   . Sleep disturbance   . Hyponatremia   . Prediabetes   . Slow transit constipation   . Essential hypertension   . Labile blood pressure   . Hypertensive emergency 01/04/2019  . Hyperlipemia 01/04/2019  . BPH (benign prostatic hyperplasia) 01/04/2019  . Thyroid mass, left 01/04/2019  . Left-sided intracerebral hemorrhage (HCC) 01/04/2019  . 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 Chronic post-traumatic stress disorder (PTSD) 12/25/2018  . MDD (major depressive disorder), recurrent episode, moderate (HCC) 12/04/2018  . GIB (gastrointestinal bleeding) 12/31/2017    Expected Discharge Date: Expected Discharge Date: 02/07/19  Team Members Present: Physician leading conference: Dr. Claudette Laws Social Worker Present: Staci Acosta, LCSW Nurse Present: Other (comment)(Jamie Freida Busman, RN) PT Present: Woodfin Ganja, PT OT Present: Perrin Maltese, OT SLP Present: Reuel Derby, SLP PPS  Coordinator present : Fae Pippin     Current Status/Progress Goal Weekly Team Focus  Medical   Sleeping ok, weaning down on meds, family traing in process  maintain med stabgility reduce fall  D/C planning   Bowel/Bladder   LBM 02/05/19, Continue to refuse Mialax, remain on stool softeners and ,Flomax   remain continent of b/b; gain regualr bowel pattern  QS assess and prn needs    Swallow/Nutrition/ Hydration             ADL's   Min assist for UB bathing with mod assist for UB dressing.  Mod assist for LB bathing with mod to max for LB dressing.  Max assist for stand pivot transfers with min to mod for squat pivot transfers.    min assist for UB selfcare with mod to max for LB selfcare and transfers  neuromuscular re-education, balance retraining, selfcare retraining, transfer training, pt/family education   Mobility   min assist bed mobility, CGA slideboard transfers, supervision w/c propulsion, min-mod assist sit<>stand, gait mod assist 20 ft with hemi walker  min assist bed mobility, mod assist transfers, mod assist ambulating with LRAD  d/c planning and fam ed   Communication             Safety/Cognition/ Behavioral Observations  Supervision A  Supervision A  education completed and carryover of strategies    Pain   PRN Tylenol rae pain 7/10 on pain scale  pain level <2/10  Assess QS and prn, evaluate effectiveness of meds   Skin   Ecchymotic areas resolving with no discomfort  remain free of new skin breakdown/infection   Assess QS/PRN address any new areas  per protocol and practice, notify medical team as needed with f/u    Rehab Goals Patient on target to meet rehab goals: Yes Rehab Goals Revised: none *See Care Plan and progress notes for long and short-term goals.     Barriers to Discharge  Current Status/Progress Possible Resolutions Date Resolved   Physician    Neurogenic Bowel & Bladder     ready for D/C in am  family training to continue today      Nursing                   PT                    OT                  SLP                SW                Discharge Planning/Teaching Needs:  Pt to return to his home where his wife and dtr will provide necessary care.  Dtr has completed family education.   Team Discussion:  Pt with no medical issues.  Dr. Wynn BankerKirsteins tried to simplify pt's medications.  Pt will need to f/u with his psychiatrist after d/c.  Pt came for family education and pt will need a drop arm commode.  Pt will need min to mod A for car txs and will use the stedy at home.  He should only walk with home therapist right now using a hemi walker.  Pt is ready for d/c.  Revisions to Treatment Plan:  none        I attest that I was present, lead the team conference, and concur with the assessment and plan of the team.Team conference was held via web/ teleconference due to COVID - 19.   Fleming Prill, Vista DeckJennifer Capps 02/08/2019, 4:49 PM

## 2019-02-08 NOTE — Progress Notes (Signed)
Social Work Patient ID: Patrick Pollard, male   DOB: 02-20-1946, 73 y.o.   MRN: [id number]   CSW met with pt and spoke with his family via telephone and with dtr in person when she came for family education to update her on team conference discussion.  They are prepared for pt at home.  CSW has ordered DME and arranged HH.  CSW will continue to follow and assist as needed.

## 2019-02-08 NOTE — Progress Notes (Signed)
Social Work Discharge Note  The overall goal for the admission was met for:   Discharge location: Yes - home with his wife and dtr  Length of Stay: Yes - 34 days  Discharge activity level: Yes - Min A w/c level for PT; mod A overall for OT  Home/community participation: Yes  Services provided included: MD, RD, PT, OT, SLP, RN, Pharmacy, Neuropsych and SW  Financial Services: Private Insurance: [employer]  Follow-up services arranged: Home Health: PT/OT/ST/SNA from [facility], DME: 18"x18" wheelchair with basic cushion; hospital bed; 30" slide board; and drop arm commode from [employer]  and Patient/Family has no preference for HH/DME agencies  Comments (or additional information): Family education completed with dtr.  Wife to assist as she is physically able, her foot is in a walking boot currently.  Patient/Family verbalized understanding of follow-up arrangements: Yes  Individual responsible for coordination of the follow-up plan: pt's dtr, [name] [phone]  Confirmed correct DME delivered: [name] 02/08/2019    Patrick Pollard, Patrick Pollard

## 2019-02-08 NOTE — Discharge Instructions (Signed)
Inpatient Rehab Discharge Instructions  Patrick Pollard Discharge date and time: No discharge date for patient encounter.   Activities/Precautions/ Functional Status: Activity: activity as tolerated Diet: regular diet Wound Care: none needed Functional status:  ___ No restrictions     ___ Walk up steps independently ___ 24/7 supervision/assistance   ___ Walk up steps with assistance ___ Intermittent supervision/assistance  ___ Bathe/dress independently ___ Walk with walker     _x__ Bathe/dress with assistance ___ Walk Independently    ___ Shower independently ___ Walk with assistance    ___ Shower with assistance ___ No alcohol     ___ Return to work/school ________  COMMUNITY REFERRALS UPON DISCHARGE:   Home Health:   PT     OT     ST     CNA        Agency:  [facility]:  [phone] Medical Equipment/Items Ordered:  18"x18" wheelchair with basic cushion; hospital bed; 30" slide board; drop arm commode  Agency/Supplier:  [employer]     Phone:  [phone]  GENERAL COMMUNITY RESOURCES FOR PATIENT/FAMILY: Support Groups:  [facility] Stroke Support Group - on hold for COVID-19                              Meets the second Thursday of each month from 6 - 7 PM (except June, July, August)                              [facility], [address], [facility] Dayroom                              For more information, call [phone]  Special Instructions: No driving  Follow-up left thyroid mass as outpatient possible need for biopsy   My questions have been answered and I understand these instructions. I will adhere to these goals and the provided educational materials after my discharge from the hospital.  Patient/Caregiver Signature _______________________________ Date __________  Clinician Signature _______________________________________ Date __________  Please bring this form and your medication list  with you to all your follow-up doctor's appointments.

## 2019-02-11 ENCOUNTER — Telehealth: Payer: Self-pay

## 2019-02-11 DIAGNOSIS — K5901 Slow transit constipation: Secondary | ICD-10-CM

## 2019-02-11 DIAGNOSIS — G479 Sleep disorder, unspecified: Secondary | ICD-10-CM

## 2019-02-11 DIAGNOSIS — E871 Hypo-osmolality and hyponatremia: Secondary | ICD-10-CM

## 2019-02-11 DIAGNOSIS — Z87448 Personal history of other diseases of urinary system: Secondary | ICD-10-CM

## 2019-02-11 HISTORY — DX: Hypo-osmolality and hyponatremia: E87.1

## 2019-02-11 HISTORY — DX: Personal history of other diseases of urinary system: Z87.448

## 2019-02-11 HISTORY — DX: Sleep disorder, unspecified: G47.9

## 2019-02-11 NOTE — Telephone Encounter (Signed)
Transitional Care call-Heather-daughter    1. Are you/is patient experiencing any problems since coming home?No Are there any questions regarding any aspect of care?No 2. Are there any questions regarding medications administration/dosing?No Are meds being taken as prescribed?[facility] Patient should review meds with caller to confirm 3. Have there been any falls?No 4. Has Home Health been to the house and/or have they contacted you?Yes If not, have you tried to contact them? Can we help you contact them? 5. Are bowels and bladder emptying properly?Yes Are there any unexpected incontinence issues? No If applicable, is patient following bowel/bladder programs? 6. Any fevers, problems with breathing, unexpected pain?No 7. Are there any skin problems or new areas of breakdown?has bed sore but no need for [facility] right now, has it under control 8. Has the patient/family member arranged specialty MD follow up (ie cardiology/neurology/renal/surgical/etc)?Yes  Can we help arrange? 9. Does the patient need any other services or support that we can help arrange? Bedside commode and hospital too small but will call the company that they came from to see if they can arrange another one that fits 10. Are caregivers following through as expected in assisting the patient?Yes 11. Has the patient quit smoking, drinking alcohol, or using drugs as recommended?Yes  Appointment time 2:15 pm on Monday Feb 18, 2019 Webex visit with Dr. Wynn Banker [address] [phone]

## 2019-02-12 DIAGNOSIS — Z86010 Personal history of colonic polyps: Secondary | ICD-10-CM | POA: Insufficient documentation

## 2019-02-15 ENCOUNTER — Encounter: Payer: Self-pay | Admitting: Physical Medicine & Rehabilitation

## 2019-02-15 ENCOUNTER — Other Ambulatory Visit: Payer: Self-pay

## 2019-02-15 ENCOUNTER — Encounter: Payer: Medicare Other | Attending: Physical Medicine & Rehabilitation | Admitting: Physical Medicine & Rehabilitation

## 2019-02-15 VITALS — BP 108/66

## 2019-02-15 DIAGNOSIS — G8104 Flaccid hemiplegia affecting left nondominant side: Secondary | ICD-10-CM

## 2019-02-15 DIAGNOSIS — I619 Nontraumatic intracerebral hemorrhage, unspecified: Secondary | ICD-10-CM

## 2019-02-15 NOTE — Progress Notes (Signed)
Subjective:  Consent for [facility] visit  Patient ID: Patrick Pollard, male    DOB: 12-Jun-1946, 73 y.o.   MRN: [id number] 73 year old right-handed male with history of GI bleed due to polyp status post resection 2018, hypertension maintained on Maxide, depression on Abilify, Effexor or as well as Wellbutrin. Lives with spouse independent prior to admission. Presented 01/01/2019 with acute onset of left-sided weakness slurred speech and facial droop while in the bathroom. Blood pressure 155 systolic. Denied any chest pain or shortness of breath. Cranial CT scan showed a 3.1 x 2.6 x 2.3 cm acute hemorrhage in the right lateral thalamus posterior limb internal capsule mild surrounding edema. CTA angiogram of head and neck negative for vascular malformation. 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 Echocardiogram with ejection fraction of 60% normal systolic function. Initially placed on Cardene for blood pressure control.  HPI At home starting HHPT, OT Requiring assistance with UE and LE ADL OT is starting to work on showering  Sponge baths Doing standing pivot transfers usually 1 person assist No falls Standing with steadi Appetite ok  Seen by PCP, BPs are running low , meds adjusted, on amlodipine 1.25mg  per day  Discussed Ritalin use , no longer drowsy in the am  Taking Senna S 2 po qhs Not taking Miralax, having BM every 3 days which is his usual pattern  Shoulder pain mainly at night   Reviewed meds, not taking ritalin, no daytime drowsiness Pain Inventory Average Pain 0 Pain Right Now na My pain is na  In the last 24 hours, has pain interfered with the following? General activity 0 Relation with others 0 Enjoyment of life 0 What TIME of day is your pain at its worst? na Sleep (in general) Poor  Pain is worse with: na Pain improves with: na Relief from Meds: na  Mobility use a wheelchair needs help with transfers  Function disabled: date disabled na I need  assistance with the following:  dressing, bathing, toileting, meal prep, household duties and shopping  Neuro/Psych bladder control problems tingling  Prior Studies Any changes since last visit?  no  Physicians involved in your care Primary care Dr. Dina Rich   No family history on file. Social History   Socioeconomic History  . Marital status: Married    Spouse name: Not on file  . Number of children: 3  . Years of education: Not on file  . Highest education level: Some college, no degree  Occupational History  . Not on file  Social Needs  . Financial resource strain: Not hard at all  . Food insecurity:    Worry: Never true    Inability: Never true  . Transportation needs:    Medical: No    Non-medical: Not on file  Tobacco Use  . Smoking status: Former Smoker    Years: 37.00    Types: Cigarettes    Last attempt to quit: 12/31/1996    Years since quitting: 22.1  . Smokeless tobacco: Never Used  Substance and Sexual Activity  . Alcohol use: Not Currently  . Drug use: Never  . Sexual activity: Not on file  Lifestyle  . Physical activity:    Days per week: Not on file    Minutes per session: Not on file  . Stress: Only a little  Relationships  . Social connections:    Talks on phone: Not on file    Gets together: Not on file    Attends religious service: More than  4 times per year    Active member of club or organization: No    Attends meetings of clubs or organizations: Never    Relationship status: Married  Other Topics Concern  . Not on file  Social History Narrative  . Not on file   No past surgical history on file. Past Medical History:  Diagnosis Date  . Anxiety   . Colon polyps    s/p colonoscopy 12/2016 wtih polyp removal   . Depression    BP 108/66 Comment: pt reported virtual visit  Opioid Risk Score:   Fall Risk Score:  `1  Depression screen PHQ 2/9  No flowsheet data found.  Review of Systems  Constitutional: Negative.   HENT:  Negative.   Eyes: Negative.   Cardiovascular: Negative.   Gastrointestinal: Negative.   Endocrine: Negative.   Genitourinary: Negative.   Musculoskeletal: Positive for gait problem.  Skin: Negative.   Allergic/Immunologic: Negative.   Neurological:       Tingling  Hematological: Negative.   Psychiatric/Behavioral: Negative.   All other systems reviewed and are negative.      Objective:   Physical Exam Nursing note reviewed. Exam conducted with a chaperone present (daughter in room with pt).  Constitutional:      Appearance: Normal appearance. He is obese.  HENT:     Head: Normocephalic and atraumatic.     Mouth/Throat:     Mouth: Mucous membranes are moist.  Eyes:     Extraocular Movements: Extraocular movements intact.     Conjunctiva/sclera: Conjunctivae normal.     Pupils: Pupils are equal, round, and reactive to light.  Neurological:     Mental Status: He is alert and oriented to person, place, and time.     Cranial Nerves: No dysarthria.     Comments: No evidence of dysarthria or aphasia  Left delt 2-, biceps triceps 2-, finger flexors and extensors 3-  Psychiatric:        Mood and Affect: Mood normal.        Behavior: Behavior normal.        Thought Content: Thought content normal.        Judgment: Judgment normal.           Assessment & Plan:  1.  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

## 2019-02-18 ENCOUNTER — Inpatient Hospital Stay: Payer: Medicare Other | Admitting: Physical Medicine & Rehabilitation

## 2019-03-07 DIAGNOSIS — L308 Other specified dermatitis: Secondary | ICD-10-CM

## 2019-03-07 HISTORY — DX: Other specified dermatitis: L30.8

## 2019-03-14 ENCOUNTER — Ambulatory Visit (INDEPENDENT_AMBULATORY_CARE_PROVIDER_SITE_OTHER): Payer: Medicare Other | Admitting: Neurology

## 2019-03-14 ENCOUNTER — Other Ambulatory Visit: Payer: Self-pay

## 2019-03-14 ENCOUNTER — Encounter: Payer: Self-pay | Admitting: Neurology

## 2019-03-14 DIAGNOSIS — G8194 Hemiplegia, unspecified affecting left nondominant side: Secondary | ICD-10-CM

## 2019-03-14 DIAGNOSIS — I610 Nontraumatic intracerebral hemorrhage in hemisphere, subcortical: Secondary | ICD-10-CM

## 2019-03-14 NOTE — Progress Notes (Signed)
Virtual Visit via Video Note  I connected with Patrick LevyBenjamin Wenzlick on 03/14/19 at 10:30 AM EDT by a video enabled telemedicine application and verified that I am speaking with the correct person using two identifiers.  Location: Patient: at home with daughter Provider: at [facility]  REf MD : Dr Roda ShuttersXu Reason : ICH I discussed the limitations of evaluation and management by telemedicine and the availability of in person appointments. The patient expressed understanding and agreed to proceed.  This visit was performed using [email or web address] app for video and audio.  The patient's daughter was present throughout the visit and facilitated it.  History of Present Illness: Patrick Pollard is a 73 year old Caucasian male seen today for initial virtual video consultation visit following a recent intracerebral hemorrhage.  History is obtained from the patient, review of electronic medical records in the hospital and have personally reviewed imaging films.  He presented on 01/01/2019 with sudden onset of left-sided hemiplegia.  Blood pressure on arrival was only 155 systolic however CT scan of the head showed a right thalamic intra-cerebral hemorrhage without intraventricular extension.  NIH stroke scale on admission was 14.  ICH score was 0.  Patient was admitted to the intensive care unit her blood pressure was tightly controlled.  Follow-up CT scan of the head showed stable thalamic hematoma without intraventricular extension and hydrocephalus.  CT angiogram showed no AVM or aneurysm.  2D echo showed normal ejection fraction.  LDL cholesterol was 1 1 1  mg percent and hemoglobin A1c was 5.8.  Patient remained stable and was transferred to neurology floor and subsequently to inpatient rehab where he stayed for 6 weeks.  He is currently at home.  He states he is obtaining slow improvement.  He is able to move both left arm and left leg against gravity.  He is able to walk short distances using a walker though he needs help to get out  of bed.  He is currently getting home physical and occupational therapy.  Still has significant weakness in his left hand as well as left foot drop.  He still has persistent left face and body numbness and paresthesias but these are not bothersome.  He has been compliant with his medications and feels blood pressure is well controlled.  He has no other complaints.  Past medical history anxiety, colonic polyps, depression Past surgical history none Social history patient quit smoking 22 years ago.  He reports he does not use drugs. Medication allergies Rocephin causes lightheadedness Home medications amlodipine 5 mg daily Abilify 7.5 mg daily Lipitor 40 mg daily vitamin D3 25 MCG daily Zestril 20 mg daily Ativan 0.5 mg every 8 hourly as needed for anxiety.  Amities a 24 mcg capsule twice daily for constipation melatonin 3 mg tablet at bedtime Glucophage 500 mg half a tablet twice daily Ritalin 10 mg every morning.  Fish oil thousand milligrams daily.  Protonix 40 mg daily.  Flomax 0.4 mg capsule at bedtime.  Effexor XR 150 mg 2 tablets with breakfast.  Vitamin C 5 mg daily. 14 system review of systems is positive only for as documented above in history of present illness and all other systems negative. Observations/Objective: Physical and neurological exams are limited due to constraints of virtual video visit.  Pleasant elderly Caucasian male currently not in distress.  He is awake alert oriented to time place and person.  He has very slight dysarthria with occasional words but otherwise normal speech and language.  Extraocular movements appear full range though he has  slight saccadic dysmetria on left gaze.  Mild left lower facial asymmetry when he smiles.  Tongue midline.  Motor system exam shows left hemiplegia with grade 2-3/5 strength with distal greater than proximal weakness in both left upper and lower extremity.  He has positive left upper and lower extremity drift.  Significant weakness of left  grip and intrinsic hand muscles as well as left ankle foot drop.  He has diminished touch pinprick sensation in the left hemibody including the face.  Gait was deferred.  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  He was advised to use his cane and walker at all times and we discussed fall and safety precautions.  He was also advised to maintain strict control of hypertension with blood pressure goal below 130/90 and lipids with LDL cholesterol goal below 70 mg percent.  He was also advised to follow-up with Dr. Hermelinda Medicus or questions for post stroke spasticity and rehab follow-up he will return for an in person office visit in 2 months or call earlier if necessary Follow Up Instructions: Continue home physical occupational therapy. Follow-up as an outpatient for in person clinic visit in 2 months   I discussed the assessment and treatment plan with the patient. The patient was provided an opportunity to ask questions and all were answered. The patient agreed with the plan and demonstrated an understanding of the instructions.   The patient was advised to call back or seek an in-person evaluation if the symptoms worsen or if the condition fails to improve as anticipated.  I provided 45 minutes of non-face-to-face time during this encounter.   Delia Heady, MD

## 2019-03-18 ENCOUNTER — Other Ambulatory Visit: Payer: Self-pay

## 2019-03-18 ENCOUNTER — Encounter: Payer: Self-pay | Admitting: Physical Medicine & Rehabilitation

## 2019-03-18 ENCOUNTER — Encounter: Payer: Medicare Other | Attending: Physical Medicine & Rehabilitation | Admitting: Physical Medicine & Rehabilitation

## 2019-03-18 VITALS — BP 102/66 | HR 95

## 2019-03-18 DIAGNOSIS — I618 Other nontraumatic intracerebral hemorrhage: Secondary | ICD-10-CM | POA: Diagnosis present

## 2019-03-18 DIAGNOSIS — R269 Unspecified abnormalities of gait and mobility: Secondary | ICD-10-CM | POA: Diagnosis not present

## 2019-03-18 DIAGNOSIS — F4321 Adjustment disorder with depressed mood: Secondary | ICD-10-CM | POA: Diagnosis not present

## 2019-03-18 DIAGNOSIS — I619 Nontraumatic intracerebral hemorrhage, unspecified: Secondary | ICD-10-CM

## 2019-03-18 DIAGNOSIS — G8104 Flaccid hemiplegia affecting left nondominant side: Secondary | ICD-10-CM | POA: Diagnosis not present

## 2019-03-18 DIAGNOSIS — I10 Essential (primary) hypertension: Secondary | ICD-10-CM | POA: Insufficient documentation

## 2019-03-18 DIAGNOSIS — I69398 Other sequelae of cerebral infarction: Secondary | ICD-10-CM

## 2019-03-18 DIAGNOSIS — Z87891 Personal history of nicotine dependence: Secondary | ICD-10-CM | POA: Insufficient documentation

## 2019-03-18 DIAGNOSIS — G8194 Hemiplegia, unspecified affecting left nondominant side: Secondary | ICD-10-CM | POA: Diagnosis not present

## 2019-03-18 DIAGNOSIS — K5901 Slow transit constipation: Secondary | ICD-10-CM | POA: Diagnosis not present

## 2019-03-18 DIAGNOSIS — F4322 Adjustment disorder with anxiety: Secondary | ICD-10-CM | POA: Diagnosis not present

## 2019-03-18 DIAGNOSIS — R209 Unspecified disturbances of skin sensation: Secondary | ICD-10-CM

## 2019-03-18 HISTORY — DX: Other sequelae of cerebral infarction: I69.398

## 2019-03-18 NOTE — Progress Notes (Signed)
Subjective:  73 year old right-handed male with history of GI bleed due to polyp status post resection 2018, hypertension maintained on Maxide, depression on Abilify, Effexor or as well as Wellbutrin. Lives with spouse independent prior to admission. Presented 01/01/2019 with acute onset of left-sided weakness slurred speech and facial droop while in the bathroom. Blood pressure 175 systolic. Denied any chest pain or shortness of breath. Cranial CT scan showed a 3.1 x 2.6 x 2.3 cm acute hemorrhage in the right lateral thalamus posterior limb internal capsule mild surrounding edema. CTA angiogram of head and neck negative for vascular malformation. 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 Echocardiogram with ejection fraction of 10% normal systolic function. Initially placed on Cardene for blood pressure control.   Patient ID: Patrick Pollard, male    DOB: October 25, 1945, 73 y.o.   MRN: [id number]  HPI Hard time adjusting to new disability  HHPT, OT coming out ~2x per wk, making improvements Now showering with a shower chair Also bought a steadi mainly for shower  BPs running on low side , PCP is managing  Constipated has had a suppository twice, the patient was using senna and Colace combination in the hospital.  He initially used that at home but then ran out and was trying to get a prescription from the [place].  I did indicate that this is an over-the-counter medication and does not require prescription. No abdominal pain no nausea vomiting.  Pain Inventory Average Pain 0 Pain Right Now 0 My pain is no pain  In the last 24 hours, has pain interfered with the following? General activity 0 Relation with others 0 Enjoyment of life 0 What TIME of day is your pain at its worst? no pain Sleep (in general) Fair  Pain is worse with: no pain Pain improves with: no pain Relief from Meds: no pain  Mobility walk with assistance how many minutes can you walk? 5 ability to climb steps?  no  do you drive?  no use a wheelchair needs help with transfers  Function retired I need assistance with the following:  dressing, bathing and toileting Do you have any goals in this area?  yes  Neuro/Psych bladder control problems bowel control problems weakness numbness tingling loss of taste or smell  Prior Studies hospital f/u  Physicians involved in your care hospital f/u   History reviewed. No pertinent family history. Social History   Socioeconomic History  . Marital status: Married    Spouse name: Not on file  . Number of children: 3  . Years of education: Not on file  . Highest education level: Some college, no degree  Occupational History  . Not on file  Social Needs  . Financial resource strain: Not hard at all  . Food insecurity:    Worry: Never true    Inability: Never true  . Transportation needs:    Medical: No    Non-medical: Not on file  Tobacco Use  . Smoking status: Former Smoker    Years: 37.00    Types: Cigarettes    Last attempt to quit: 12/31/1996    Years since quitting: 22.2  . Smokeless tobacco: Never Used  Substance and Sexual Activity  . Alcohol use: Not Currently  . Drug use: Never  . Sexual activity: Not on file  Lifestyle  . Physical activity:    Days per week: Not on file    Minutes per session: Not on file  . Stress: Only a little  Relationships  .  Social connections:    Talks on phone: Not on file    Gets together: Not on file    Attends religious service: More than 4 times per year    Active member of club or organization: No    Attends meetings of clubs or organizations: Never    Relationship status: Married  Other Topics Concern  . Not on file  Social History Narrative  . Not on file   History reviewed. No pertinent surgical history. Past Medical History:  Diagnosis Date  . Anxiety   . Colon polyps    s/p colonoscopy 12/2016 wtih polyp removal   . Depression    BP 102/66   Pulse 95   SpO2 95%   Opioid  Risk Score:   Fall Risk Score:  `1  Depression screen PHQ 2/9  No flowsheet data found.  Review of Systems  Constitutional: Negative.   Gastrointestinal: Positive for constipation.  Endocrine:       High blood sugar  Genitourinary: Positive for difficulty urinating.  Musculoskeletal: Positive for gait problem.  Skin: Positive for rash.  Neurological: Positive for weakness and numbness.       Tingling   All other systems reviewed and are negative.      Objective:   Physical Exam  Well-developed well-nourished male no acute distress Mood and affect are appropriate Left upper extremity no evidence of shoulder subluxation.  There is no pain with shoulder range of motion with external rotation abduction forward flexion. His motor strength in the left upper extremity is 3- at the shoulder abductors 3- at the elbow flexors 0 at the elbow extensors to minus at the finger flexors 0 at the finger extensors 0 at the wrist flexors and extensors Left lower extremity strength is 3- hip knee extensor synergy He has 2- ankle plantarflexion 0 ankle dorsiflexion 0 toe flexion and extension Sensation is absent in the left upper extremity and reduced left side of face intact sensation left lower extremity.  Normal sensation right side. Gait not tested he does not have his hemiwalker with him today. Tone, MAS 1 at the elbow flexor MAS 0 at the wrist flexor MAS 1 at the finger flexors with exception of left thumb which is MAS 2 Left lower extremity has 2 beats of clonus at the ankle There is no evidence of hamstring spasticity     Assessment & Plan:  1.  Right thalamic hemorrhage with left hemiparesis, left hemisensory deficits primarily affecting face and left upper extremity, gait disorder and adjustment disorder. Overall the patient has made good progress compared to his initial evaluation when admitted [facility]. Patient does feel optimistic about making some  further improvements.  We discussed that while home health therapy is appropriate at this stage he will progress to the point where he will need outpatient PT OT and possibly speech. He does not have any vegetative signs at this time.  He does see a psychiatrist as well as a counselor on the outpatient side, and he will plan to make a follow-up appointment.  Physical medicine rehab follow-up in 6 weeks  #2.  Slow transit constipation secondary to immobility.  Recommend Senokot S i.e. docusate sodium and psyllium combination 2 tablets twice a day.  May reduce to 2 tablets at bedtime if stooling becomes excessive  Over half of the 25 min visit was spent counseling and coordinating care.  We discussed the patient's mood and his problem adjusting to his new limitations.  He will follow-up with psychiatry and his counselor for this. We also discussed his bowel program we went over instructions on how to use the docusate sodium and psyllium

## 2019-03-18 NOTE — Patient Instructions (Addendum)
Senna S Docusate plus Psyllium up to 2  Tablet twice  Massage Left hand from fingers to wrist , elevated above the heart

## 2019-03-27 ENCOUNTER — Other Ambulatory Visit: Payer: Self-pay

## 2019-03-27 ENCOUNTER — Ambulatory Visit (INDEPENDENT_AMBULATORY_CARE_PROVIDER_SITE_OTHER): Payer: Medicare Other | Admitting: Psychiatry

## 2019-03-27 DIAGNOSIS — F331 Major depressive disorder, recurrent, moderate: Secondary | ICD-10-CM | POA: Diagnosis not present

## 2019-03-27 DIAGNOSIS — F4312 Post-traumatic stress disorder, chronic: Secondary | ICD-10-CM

## 2019-03-27 MED ORDER — VENLAFAXINE HCL ER 150 MG PO CP24: 300.0000 mg | ORAL_CAPSULE | Freq: Every day | ORAL | 2 refills | Status: DC

## 2019-03-27 MED ORDER — MELATONIN 3 MG PO TABS: 3.0000 mg | ORAL_TABLET | Freq: Every day | ORAL | 0 refills | Status: DC

## 2019-03-27 NOTE — Progress Notes (Addendum)
BH MD/PA/NP OP Progress Note  03/27/2019 2:46 PM Patrick Pollard  MRN:  [id number] Interview was conducted by phone and I verified that I was speaking with the correct person using two identifiers. I discussed the limitations of evaluation and management by telemedicine and  the availability of in person appointments. Patient expressed understanding and agreed to proceed.  Chief Complaint: Depression.  HPI: 73 yo married male, Patrick Pollard war veteran, who presents with recurrent major depression which severity waxes and weans but as far as he can tel it has never fully remitted. He has been on 300 mg dose of xr venlafaxine for many years. He has a hx of SI but not recently. He does not wish to change antidepressants (has not tried Trintellix, Viibryd, [place]) but was open to a trial of Abilify to augment his Effexor. We have added 2 mg of aripiprazole to venlafaxine and Patrick Pollard noticed almost immediate improvement in mood. The dose was then increased to 5 mg in hope of achieving full remission. Unfortunately within a week of our last visit Patrick Pollard suffered a right thalamic stroke thought to be related to increased blood pressure. He is slowly recovering; speech has improved, left sided weakness still present - he works with PT. His blood pressure is now normal. These events were a major setback and his mood has predictably declined again. Dose of  Aripiprazole was further increased while he was still in the hospital - he is now on 7.5 mg daily and tolerates it well. His cholesterol is normal while tryglycerides are mildly elevated. He reports no nightmares and no problems with sleep: typ[ically sleeps 12 hours straight but then c/o back pain. He takes 3 mg of melatonin at bedtime. His appetite is good. He denies feeling hopeless or suicidal. He has been prescribed lorazepam prn anxiety but seldom uses it. In the past he has expressed interest in trying ECT.  Visit Diagnosis:    ICD-10-CM   1. MDD (major  depressive disorder), recurrent episode, moderate (HCC)  F33.1   2. Chronic post-traumatic stress disorder (PTSD)  F43.12     Past Psychiatric History: Please refer to intake H&P.  Past Medical History:  Past Medical History:  Diagnosis Date  . Anxiety   . Colon polyps    s/p colonoscopy 12/2016 wtih polyp removal   . Depression    No past surgical history on file.  Family Psychiatric History: None.  Family History: No family history on file.  Social History:  Social History   Socioeconomic History  . Marital status: Married    Spouse name: Not on file  . Number of children: 3  . Years of education: Not on file  . Highest education level: Some college, no degree  Occupational History  . Not on file  Social Needs  . Financial resource strain: Not hard at all  . Food insecurity    Worry: Never true    Inability: Never true  . Transportation needs    Medical: No    Non-medical: Not on file  Tobacco Use  . Smoking status: Former Smoker    Years: 37.00    Types: Cigarettes    Quit date: 12/31/1996    Years since quitting: 22.2  . Smokeless tobacco: Never Used  Substance and Sexual Activity  . Alcohol use: Not Currently  . Drug use: Never  . Sexual activity: Not on file  Lifestyle  . Physical activity    Days per week: Not on file    Minutes per  session: Not on file  . Stress: Only a little  Relationships  . Social Musicianconnections    Talks on phone: Not on file    Gets together: Not on file    Attends religious service: More than 4 times per year    Active member of club or organization: No    Attends meetings of clubs or organizations: Never    Relationship status: Married  Other Topics Concern  . Not on file  Social History Narrative  . Not on file    Allergies:  Allergies  Allergen Reactions  . Rocephin [Ceftriaxone] Other (See Comments)    Light headed, and nearly passed out    Metabolic Disorder Labs: Lab Results  Component Value Date   HGBA1C  5.8 (H) 01/03/2019   MPG 119.76 01/03/2019   No results found for: PROLACTIN Lab Results  Component Value Date   CHOL 185 01/03/2019   TRIG 108 01/03/2019   HDL 52 01/03/2019   CHOLHDL 3.6 01/03/2019   VLDL 22 01/03/2019   LDLCALC 111 (H) 01/03/2019   No results found for: TSH  Therapeutic Level Labs: No results found for: LITHIUM No results found for: VALPROATE No components found for:  CBMZ  Current Medications: Current Outpatient Medications  Medication Sig Dispense Refill  . acetaminophen (TYLENOL) 325 MG tablet Take 2 tablets (650 mg total) by mouth every 4 (four) hours as needed for mild pain (or temp > 37.5 C (99.5 F)).    Marland Kitchen. amLODipine (NORVASC) 5 MG tablet Take 1 tablet (5 mg total) by mouth daily. (Patient taking differently: Take 5 mg by mouth daily. Take .25 mg daily) 30 tablet 0  . ARIPiprazole (ABILIFY) 15 MG tablet Take 0.5 tablets (7.5 mg total) by mouth daily. 30 tablet 1  . atorvastatin (LIPITOR) 40 MG tablet Take 1 tablet (40 mg total) by mouth daily. 30 tablet 0  . cholecalciferol (VITAMIN D3) 25 MCG (1000 UT) tablet Take 1,000 Units by mouth daily.    Marland Kitchen. lisinopril (ZESTRIL) 20 MG tablet Take 1 tablet (20 mg total) by mouth daily. 30 tablet 0  . LORazepam (ATIVAN) 0.5 MG tablet Take 1 tablet (0.5 mg total) by mouth every 8 (eight) hours as needed for anxiety (PRN anxiety). 20 tablet 0  . lubiprostone (AMITIZA) 24 MCG capsule Take 24 mcg by mouth 2 (two) times daily as needed for constipation.    . Melatonin 3 MG TABS Take 1 tablet (3 mg total) by mouth at bedtime. 30 tablet 0  . metFORMIN (GLUCOPHAGE) 500 MG tablet Take 0.5 tablets (250 mg total) by mouth 2 (two) times daily with a meal. 60 tablet 0  . methylphenidate (RITALIN) 10 MG tablet Take 1 tablet (10 mg total) by mouth daily at 6 (six) AM. 30 tablet 0  . Omega-3 Fatty Acids (FISH OIL) 1000 MG CPDR Take 1 capsule by mouth daily.    . pantoprazole (PROTONIX) 40 MG tablet Take 1 tablet (40 mg total) by mouth  at bedtime. 30 tablet 0  . polyethylene glycol (MIRALAX / GLYCOLAX) 17 g packet Take 17 g by mouth 2 (two) times daily. 14 each 0  . senna-docusate (SENOKOT-S) 8.6-50 MG tablet Take 1 tablet by mouth 2 (two) times daily.    . tamsulosin (FLOMAX) 0.4 MG CAPS capsule Take 1 capsule (0.4 mg total) by mouth at bedtime. 30 capsule 0  . venlafaxine XR (EFFEXOR-XR) 150 MG 24 hr capsule Take 2 capsules (300 mg total) by mouth daily with breakfast. 30 capsule  0  . vitamin C (ASCORBIC ACID) 500 MG tablet Take 500 mg by mouth daily.     No current facility-administered medications for this visit.      Psychiatric Specialty Exam: Review of Systems  Cardiovascular: Positive for leg swelling.  Gastrointestinal: Positive for constipation.  Musculoskeletal: Positive for back pain.  Neurological: Positive for weakness.  Psychiatric/Behavioral: Positive for depression.  All other systems reviewed and are negative.   There were no vitals taken for this visit.There is no height or weight on file to calculate BMI.  General Appearance: NA  Eye Contact:  NA  Speech:  Mild dysarthria.  Volume:  Normal  Mood:  Depressed  Affect:  NA  Thought Process:  Goal Directed  Orientation:  Full (Time, Place, and Person)  Thought Content: Logical   Suicidal Thoughts:  No  Homicidal Thoughts:  No  Memory:  Immediate;   Good Recent;   Good Remote;   Good  Judgement:  Good  Insight:  Good  Psychomotor Activity:  NA  Concentration:  Concentration: Good and Attention Span: Good  Recall:  Good  Fund of Knowledge: Good  Language: Good  Akathisia:  Negative  Handed:  Right  AIMS (if indicated): not done  Assets:  Communication Skills Desire for Improvement Financial Resources/Insurance Housing Resilience  ADL's:  Intact  Cognition: WNL  Sleep:  Good   Screenings: ECT-MADRS     [facility] Visit from 12/04/2018 [facility]  MADRS Total Score  12       Assessment  and Plan: 73 yo married male, Patrick Pollard war veteran, who presents with recurrent major depression (and hx of PTSD) which severity waxes and weans but as far as he can tel it has never fully remitted. He has been on 300 mg dose of xr venlafaxine for many years. He has a hx of SI but not recently. He does not wish to change antidepressants (has not tried Trintellix, Viibryd, [place]) but was open to a trial of Abilify to augment his Effexor. We have added 2 mg of aripiprazole to venlafaxine and Patrick Pollard noticed almost immediate improvement in mood. The dose was then increased to 5 mg in hope of achieving full remission. Unfortunately within a week of our last visit Patrick Pollard suffered a right thalamic stroke thought to be related to increased blood pressure. He is slowly recovering; speech has improved, left sided weakness still present - he works with PT. His blood pressure is now normal. These events were a major setback and his mood has predictably declined again. Dose of  Aripiprazole was further increased while he was still in the hospital - he is now on 7.5 mg daily and tolerates it well. His cholesterol is normal while tryglycerides are mildly elevated. He reports no nightmares and no problems with sleep: typ[ically sleeps 12 hours straight but then c/o back pain. He takes 3 mg of melatonin at bedtime. His appetite is good. He denies feeling hopeless or suicidal. He has been prescribed lorazepam prn anxiety but seldom uses it. In the past he has expressed interest in trying ECT.  Plan: We decided to continue Effexor XR, Abilify and melatonin unchanged at this time. We will address possibility of ECT in the future once he is more advanced in recovery from stroke should his mood remain depressed. We will schedule follow up appointment in 3 months or earlier if needed. The plan was discussed with patient who had an opportunity to ask questions and these were all answered.  The patient agreed with the plan and  demonstrated understanding of the instructions.   The plan was discussed with patient who had an opportunity to ask questions and these were all answered. I spend 25 minutes in phone consultation with the patient.    Stephanie Acre, MD 03/27/2019, 2:46 PM

## 2019-04-30 ENCOUNTER — Other Ambulatory Visit: Payer: Self-pay

## 2019-04-30 ENCOUNTER — Encounter: Payer: Self-pay | Admitting: Physical Medicine & Rehabilitation

## 2019-04-30 ENCOUNTER — Telehealth: Payer: Self-pay | Admitting: Physical Medicine & Rehabilitation

## 2019-04-30 ENCOUNTER — Encounter: Payer: Medicare Other | Attending: Physical Medicine & Rehabilitation | Admitting: Physical Medicine & Rehabilitation

## 2019-04-30 VITALS — BP 130/84 | HR 86 | Temp 97.9°F | Ht 73.0 in | Wt 220.0 lb

## 2019-04-30 DIAGNOSIS — I618 Other nontraumatic intracerebral hemorrhage: Secondary | ICD-10-CM | POA: Insufficient documentation

## 2019-04-30 DIAGNOSIS — I10 Essential (primary) hypertension: Secondary | ICD-10-CM | POA: Insufficient documentation

## 2019-04-30 DIAGNOSIS — I69359 Hemiplegia and hemiparesis following cerebral infarction affecting unspecified side: Secondary | ICD-10-CM

## 2019-04-30 DIAGNOSIS — R269 Unspecified abnormalities of gait and mobility: Secondary | ICD-10-CM | POA: Insufficient documentation

## 2019-04-30 DIAGNOSIS — G8194 Hemiplegia, unspecified affecting left nondominant side: Secondary | ICD-10-CM | POA: Insufficient documentation

## 2019-04-30 DIAGNOSIS — F4322 Adjustment disorder with anxiety: Secondary | ICD-10-CM | POA: Insufficient documentation

## 2019-04-30 DIAGNOSIS — I69398 Other sequelae of cerebral infarction: Secondary | ICD-10-CM | POA: Diagnosis not present

## 2019-04-30 DIAGNOSIS — R209 Unspecified disturbances of skin sensation: Secondary | ICD-10-CM

## 2019-04-30 DIAGNOSIS — K5901 Slow transit constipation: Secondary | ICD-10-CM | POA: Diagnosis not present

## 2019-04-30 DIAGNOSIS — Z87891 Personal history of nicotine dependence: Secondary | ICD-10-CM | POA: Insufficient documentation

## 2019-04-30 NOTE — Telephone Encounter (Signed)
May schedule for virtual in 6 weeks

## 2019-04-30 NOTE — Telephone Encounter (Signed)
While checking out Patrick Pollard, they said you told them that they could schedule a virtual visit with them. I just wanted to confirm that was correct and just to see when you wanted to schedule another visit for them.

## 2019-04-30 NOTE — Progress Notes (Signed)
Subjective:    Patient ID: Patrick Pollard, male    DOB: 10-18-1945, 73 y.o.   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  He is completed inpatient rehabilitation program and is now receiving home health therapy. Dons shirt but not pants  [place] just before an OT session ~1 mo ago. No injuy, wasn't a hard fal PT,OT sill coming out , they are doing another evaluation  Using QC for ambulation, this is going better than with hemiwalker. Pain Inventory Average Pain 0 Pain Right Now 0 My pain is no pain  In the last 24 hours, has pain interfered with the following? General activity 0 Relation with others 0 Enjoyment of life 0 What TIME of day is your pain at its worst? night Sleep (in general) Poor  Pain is worse with: no pain Pain improves with: no pain Relief from Meds: 0  Mobility walk with assistance use a cane use a walker how many minutes can you walk? 5 ability to climb steps?  no do you drive?  no  Function retired I need assistance with the following:  dressing, bathing and toileting  Neuro/Psych bladder control problems bowel control problems weakness numbness trouble walking depression anxiety loss of taste or smell  Prior Studies Any changes since last visit?  no  Physicians involved in your care Primary care . Neurologist . Psychiatrist .   No family history on file. Social History   Socioeconomic History  . Marital status: Married    Spouse name: Not on file  . Number of children: 3  . Years of education: Not on file  . Highest education level: Some college, no degree  Occupational History  . Not on file  Social Needs  . Financial resource strain: Not hard at all  . Food insecurity    Worry: Never true    Inability: Never true  . Transportation needs    Medical: No   Non-medical: Not on file  Tobacco Use  . Smoking status: Former Smoker    Years: 37.00    Types: Cigarettes    Quit date: 12/31/1996    Years since quitting: 22.3  . Smokeless tobacco: Never Used  Substance and Sexual Activity  . Alcohol use: Not Currently  . Drug use: Never  . Sexual activity: Not on file  Lifestyle  . Physical activity    Days per week: Not on file    Minutes per session: Not on file  . Stress: Only a little  Relationships  . Social Herbalist on phone: Not on file    Gets together: Not on file    Attends religious service: More than 4 times per year    Active member of club or organization: No    Attends meetings of clubs or organizations: Never    Relationship status: Married  Other Topics Concern  . Not on file  Social History Narrative  . Not on file   No past surgical history on file. Past Medical History:  Diagnosis Date  . Anxiety   . Colon polyps    s/p colonoscopy 12/2016 wtih polyp removal   . Depression    BP 130/84   Pulse 86   Temp 97.9 F (36.6 C)   Ht 6\' 1"  (1.854 m)   Wt 220 lb (99.8 kg)   SpO2 94%   BMI 29.03  kg/m   Opioid Risk Score:   Fall Risk Score:  `1  Depression screen PHQ 2/9  No flowsheet data found.  Review of Systems  Constitutional: Positive for unexpected weight change.  HENT: Negative.   Eyes: Negative.   Respiratory: Negative.   Cardiovascular: Negative.   Gastrointestinal: Positive for constipation.  Endocrine: Negative.   Genitourinary: Positive for difficulty urinating.  Musculoskeletal: Negative.   Skin: Positive for rash.  Allergic/Immunologic: Negative.   Neurological: Negative.   Hematological: Negative.   Psychiatric/Behavioral: Negative.   All other systems reviewed and are negative.      Objective:   Physical Exam Vitals signs reviewed.  Constitutional:      Appearance: Normal appearance.  Skin:    General: Skin is warm and dry.  Neurological:     Mental Status: He is  alert and oriented to person, place, and time.  Psychiatric:        Mood and Affect: Mood normal.   Gait was not tested secondary to not having his assistive device with him  MAS 0 Left elbow flexor, wrist flexor and finger flexor on th eLeft side  Motor strength is 3- at the left deltoid biceps to minus triceps to minus finger flexors and trace finger extensors. Left lower extremity has 3- hip flexor 4- knee extensors 0 ankle dorsiflexor.  Sensation reduced light touch in the left facial region as well as left hand and left leg.  He is able to identify correctly what areas touch but it feels differently than it does on the right side.     Assessment & Plan:  1.  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  He also has paresthesias left hemibody but no dysesthetic pain. Overall making some good progress in home health therapy.  Will need outpatient therapy once home health finishes up.  Patient and his daughter will call once this is imminent. [place] in [place] so [facility].

## 2019-04-30 NOTE — Patient Instructions (Signed)
Please call the office once Home health is finishing up so we can set up [facility] at [facility]

## 2019-05-06 ENCOUNTER — Other Ambulatory Visit: Payer: Self-pay

## 2019-05-06 ENCOUNTER — Ambulatory Visit (INDEPENDENT_AMBULATORY_CARE_PROVIDER_SITE_OTHER): Payer: Medicare Other | Admitting: Psychiatry

## 2019-05-06 DIAGNOSIS — F4312 Post-traumatic stress disorder, chronic: Secondary | ICD-10-CM

## 2019-05-06 DIAGNOSIS — F330 Major depressive disorder, recurrent, mild: Secondary | ICD-10-CM | POA: Diagnosis not present

## 2019-05-06 MED ORDER — VENLAFAXINE HCL ER 150 MG PO CP24: 300.0000 mg | ORAL_CAPSULE | Freq: Every day | ORAL | 2 refills | Status: DC

## 2019-05-06 MED ORDER — ARIPIPRAZOLE 5 MG PO TABS: 5.0000 mg | ORAL_TABLET | Freq: Every day | ORAL | 0 refills | Status: DC

## 2019-05-06 MED ORDER — MELATONIN 3 MG PO TABS: 6.0000 mg | ORAL_TABLET | Freq: Every day | ORAL | 0 refills | Status: DC

## 2019-05-06 NOTE — Progress Notes (Signed)
BH MD/PA/NP OP Progress Note  05/06/2019 2:12 PM Reece LevyBenjamin Agan  MRN:  [id number] Interview was conducted by phone and I verified that I was speaking with the correct person using two identifiers. I discussed the limitations of evaluation and management by telemedicine and  the availability of in person appointments. Patient expressed understanding and agreed to proceed.  Chief Complaint: Some depression, difficulty with sleep.  HPI: 73 yo married male, [place] veteran, who presents with recurrent major depression(and hx of PTSD) which severity waxes and weans but as far as he can tel it has never fully remitted. He has been on 300 mg dose of xr venlafaxine for many years. He has a hx of SIbut not recently. He does not wish to change antidepressants (has not tried Trintellix, Viibryd, [place]) but was open to a trial of Abilify to augment his Effexor.We have added 2 mg of aripiprazole to venlafaxine and Sharlet SalinaBenjamin noticed almost immediate improvement in mood. The dose was then increased to 5 mg in hope of achieving full remission. Unfortunately in lLate March Sharlet SalinaBenjamin suffered a right thalamic stroke thought to be related to increased blood pressure. He is slowly recovering; speech has normalized but left sided weakness still present - he works with PT on his strength, gait. CVA was a major setback and his mood has declined some. He remains positive looking and working towards full recovery of function. He reports no nightmares and no problems with sleep: typically sleeps 12 hours straight but then c/o back pain. He takes 3 mg of melatonin at bedtime but it takes up to an hour for him to fall asleep. His appetite is good. He denies feeling hopeless or suicidal. He has been prescribed lorazepam prn anxiety but does not use it anymore.In the past he has expressed interest in trying ECT.             Visit Diagnosis:    ICD-10-CM   1. Chronic post-traumatic stress disorder (PTSD)  F43.12   2. Major  depressive disorder, recurrent episode, mild (HCC)  F33.0     Past Psychiatric History: Please refer to intake H&P.  Past Medical History:  Past Medical History:  Diagnosis Date  . Anxiety   . Colon polyps    s/p colonoscopy 12/2016 wtih polyp removal   . Depression    No past surgical history on file.  Family Psychiatric History: None  Family History: No family history on file.  Social History:  Social History   Socioeconomic History  . Marital status: Married    Spouse name: Not on file  . Number of children: 3  . Years of education: Not on file  . Highest education level: Some college, no degree  Occupational History  . Not on file  Social Needs  . Financial resource strain: Not hard at all  . Food insecurity    Worry: Never true    Inability: Never true  . Transportation needs    Medical: No    Non-medical: Not on file  Tobacco Use  . Smoking status: Former Smoker    Years: 37.00    Types: Cigarettes    Quit date: 12/31/1996    Years since quitting: 22.3  . Smokeless tobacco: Never Used  Substance and Sexual Activity  . Alcohol use: Not Currently  . Drug use: Never  . Sexual activity: Not on file  Lifestyle  . Physical activity    Days per week: Not on file    Minutes per session: Not on  file  . Stress: Only a little  Relationships  . Social Musicianconnections    Talks on phone: Not on file    Gets together: Not on file    Attends religious service: More than 4 times per year    Active member of club or organization: No    Attends meetings of clubs or organizations: Never    Relationship status: Married  Other Topics Concern  . Not on file  Social History Narrative  . Not on file    Allergies:  Allergies  Allergen Reactions  . Rocephin [Ceftriaxone] Other (See Comments)    Light headed, and nearly passed out    Metabolic Disorder Labs: Lab Results  Component Value Date   HGBA1C 5.8 (H) 01/03/2019   MPG 119.76 01/03/2019   No results found  for: PROLACTIN Lab Results  Component Value Date   CHOL 185 01/03/2019   TRIG 108 01/03/2019   HDL 52 01/03/2019   CHOLHDL 3.6 01/03/2019   VLDL 22 01/03/2019   LDLCALC 111 (H) 01/03/2019   No results found for: TSH  Therapeutic Level Labs: No results found for: LITHIUM No results found for: VALPROATE No components found for:  CBMZ  Current Medications: Current Outpatient Medications  Medication Sig Dispense Refill  . acetaminophen (TYLENOL) 325 MG tablet Take 2 tablets (650 mg total) by mouth every 4 (four) hours as needed for mild pain (or temp > 37.5 C (99.5 F)).    Marland Kitchen. amLODipine (NORVASC) 5 MG tablet Take 1 tablet (5 mg total) by mouth daily. (Patient taking differently: Take 5 mg by mouth daily. Take .25 mg daily) 30 tablet 0  . ARIPiprazole (ABILIFY) 15 MG tablet Take 0.5 tablets (7.5 mg total) by mouth daily. 30 tablet 1  . atorvastatin (LIPITOR) 40 MG tablet Take 1 tablet (40 mg total) by mouth daily. 30 tablet 0  . cholecalciferol (VITAMIN D3) 25 MCG (1000 UT) tablet Take 1,000 Units by mouth daily.    Marland Kitchen. lisinopril (ZESTRIL) 20 MG tablet Take 1 tablet (20 mg total) by mouth daily. 30 tablet 0  . lubiprostone (AMITIZA) 24 MCG capsule Take 24 mcg by mouth 2 (two) times daily as needed for constipation.    . Melatonin 3 MG TABS Take 1 tablet (3 mg total) by mouth at bedtime. 90 tablet 0  . metFORMIN (GLUCOPHAGE) 500 MG tablet Take 0.5 tablets (250 mg total) by mouth 2 (two) times daily with a meal. 60 tablet 0  . methylphenidate (RITALIN) 10 MG tablet Take 1 tablet (10 mg total) by mouth daily at 6 (six) AM. 30 tablet 0  . Omega-3 Fatty Acids (FISH OIL) 1000 MG CPDR Take 1 capsule by mouth daily.    . pantoprazole (PROTONIX) 40 MG tablet Take 1 tablet (40 mg total) by mouth at bedtime. 30 tablet 0  . polyethylene glycol (MIRALAX / GLYCOLAX) 17 g packet Take 17 g by mouth 2 (two) times daily. 14 each 0  . senna-docusate (SENOKOT-S) 8.6-50 MG tablet Take 1 tablet by mouth 2 (two)  times daily.    . tamsulosin (FLOMAX) 0.4 MG CAPS capsule Take 1 capsule (0.4 mg total) by mouth at bedtime. 30 capsule 0  . venlafaxine XR (EFFEXOR-XR) 150 MG 24 hr capsule Take 2 capsules (300 mg total) by mouth daily with breakfast. 60 capsule 2  . vitamin C (ASCORBIC ACID) 500 MG tablet Take 500 mg by mouth daily.     No current facility-administered medications for this visit.  Psychiatric Specialty Exam: Review of Systems  Neurological: Positive for weakness.       Unsteady gait.  Psychiatric/Behavioral: Positive for depression. The patient has insomnia.   All other systems reviewed and are negative.   There were no vitals taken for this visit.There is no height or weight on file to calculate BMI.  General Appearance: NA  Eye Contact:  NA  Speech:  Clear and Coherent and Normal Rate  Volume:  Normal  Mood:  Depressed  Affect:  NA  Thought Process:  Goal Directed  Orientation:  Full (Time, Place, and Person)  Thought Content: Logical   Suicidal Thoughts:  No  Homicidal Thoughts:  No  Memory:  Immediate;   Good Recent;   Fair Remote;   Good  Judgement:  Good  Insight:  Good  Psychomotor Activity:  NA  Concentration:  Concentration: Good  Recall:  Good  Fund of Knowledge: Good  Language: Good  Akathisia:  NA  Handed:  Right  AIMS (if indicated): not done  Assets:  Communication Skills Desire for Improvement Housing Resilience  ADL's:  Intact  Cognition: WNL  Sleep:  Fair   Screenings: ECT-MADRS     [facility] Visit from 12/04/2018 in [facility]  MADRS Total Score  12       Assessment and Plan: 73 yo married male, [place] war veteran, who presents with recurrent major depression(and hx of PTSD) which severity waxes and weans but as far as he can tel it has never fully remitted. He has been on 300 mg dose of xr venlafaxine for many years. He has a hx of SIbut not recently. He does not wish to change antidepressants  (has not tried Trintellix, Viibryd, [place]) but was open to a trial of Abilify to augment his Effexor.We have added 2 mg of aripiprazole to venlafaxine and Dacian noticed almost immediate improvement in mood. The dose was then increased to 5 mg in hope of achieving full remission. Unfortunately in lLate March Trevor suffered a right thalamic stroke thought to be related to increased blood pressure. He is slowly recovering; speech has normalized but left sided weakness still present - he works with PT on his strength, gait. CVA was a major setback and his mood has declined some. He remains positive looking and working towards full recovery of function. He reports no nightmares and no problems with sleep: typically sleeps 12 hours straight but then c/o back pain. He takes 3 mg of melatonin at bedtime but it takes up to an hour for him to fall asleep. His appetite is good. He denies feeling hopeless or suicidal. He has been prescribed lorazepam prn anxiety but does not use it anymore.In the past he has expressed interest in trying ECT.             Plan: We decided to continue Effexor XR, Abilify (change to 5 mg for ease of use; now is cutting 15 mg in half) and increase melatonin to 6 mg. Next appointment in 3 months or earlier if needed. The plan was discussed with patient who had an opportunity to ask questions and these were all answered. I spend 25 minutes in phone consultation with the patient.      Stephanie Acre, MD 05/06/2019, 2:12 PM

## 2019-07-23 ENCOUNTER — Ambulatory Visit (INDEPENDENT_AMBULATORY_CARE_PROVIDER_SITE_OTHER): Payer: Medicare Other | Admitting: Psychiatry

## 2019-07-23 ENCOUNTER — Other Ambulatory Visit: Payer: Self-pay

## 2019-07-23 DIAGNOSIS — F4310 Post-traumatic stress disorder, unspecified: Secondary | ICD-10-CM | POA: Diagnosis not present

## 2019-07-23 DIAGNOSIS — F330 Major depressive disorder, recurrent, mild: Secondary | ICD-10-CM

## 2019-07-23 MED ORDER — ARIPIPRAZOLE 5 MG PO TABS: 5.0000 mg | ORAL_TABLET | Freq: Every day | ORAL | 0 refills | Status: DC

## 2019-07-23 MED ORDER — MELATONIN 3 MG PO TABS: 6.0000 mg | ORAL_TABLET | Freq: Every day | ORAL | 0 refills | Status: AC

## 2019-07-23 MED ORDER — VENLAFAXINE HCL ER 150 MG PO CP24: 300.0000 mg | ORAL_CAPSULE | Freq: Every day | ORAL | 2 refills | Status: DC

## 2019-07-23 NOTE — Progress Notes (Signed)
BH MD/PA/NP OP Progress Note  07/23/2019 2:11 PM Patrick Pollard  MRN:  [id number] Interview was conducted by phone and I verified that I was speaking with the correct person using two identifiers. I discussed the limitations of evaluation and management by telemedicine and  the availability of in person appointments. Patient expressed understanding and agreed to proceed.  Chief Complaint: "I am doing OK".  HPI: 73 yo married male, Patrick Pollard, who presents Copy depression(and hx of PTSD)which severity waxes and weans but as far as he can tel it has neverfullyremitted. He has been on 300 mg dose of xr venlafaxine for many years. He has a hx of SIbut not recently. He does not wish to change antidepressants (has not tried Trintellix, Viibryd, [place]) but was open to a trial of Abilify to augment his Effexor.We have added 2 mg of aripiprazole to venlafaxine and Patrick Pollard noticed almost immediate improvement in mood.The dose was then increased to 5 mg in hope of achieving full remission. Unfortunately in March Patrick Pollard suffered a right thalamic stroke thought to be related to increased blood pressure. He is slowly recovering; speech has normalized but left sided weakness still present - he works with PT on his strength, gait. CVA was a major setback and his mood has declined some. He remains positive looking and working towards full recovery of function. He reports no nightmares and no problems with sleep: typically sleeps 12 hours straight but then c/o back pain. He takes 6 mg of melatonin at bedtime with good effect. His appetite is good. He denies feeling hopeless or suicidal.He hasbeen prescribedlorazepam prn anxiety but does not use it anymore.In the past he has expressedinterest in trying ECT but given his mood improvement on a combination of Effexor/Abilify it is not entertained at this time..  Visit Diagnosis:    ICD-10-CM   1. Major depressive disorder,  recurrent episode, mild (HCC)  F33.0     Past Psychiatric History: Please see intake H&P.  Past Medical History:  Past Medical History:  Diagnosis Date  . Anxiety   . Colon polyps    s/p colonoscopy 12/2016 wtih polyp removal   . Depression    No past surgical history on file.  Family Psychiatric History: None.  Family History: No family history on file.  Social History:  Social History   Socioeconomic History  . Marital status: Married    Spouse name: Not on file  . Number of children: 3  . Years of education: Not on file  . Highest education level: Some college, no degree  Occupational History  . Not on file  Social Needs  . Financial resource strain: Not hard at all  . Food insecurity    Worry: Never true    Inability: Never true  . Transportation needs    Medical: No    Non-medical: Not on file  Tobacco Use  . Smoking status: Former Smoker    Years: 37.00    Types: Cigarettes    Quit date: 12/31/1996    Years since quitting: 22.5  . Smokeless tobacco: Never Used  Substance and Sexual Activity  . Alcohol use: Not Currently  . Drug use: Never  . Sexual activity: Not on file  Lifestyle  . Physical activity    Days per week: Not on file    Minutes per session: Not on file  . Stress: Only a little  Relationships  . Social Musician on phone: Not on file    Gets  together: Not on file    Attends religious service: More than 4 times per year    Active member of club or organization: No    Attends meetings of clubs or organizations: Never    Relationship status: Married  Other Topics Concern  . Not on file  Social History Narrative  . Not on file    Allergies:  Allergies  Allergen Reactions  . Rocephin [Ceftriaxone] Other (See Comments)    Light headed, and nearly passed out    Metabolic Disorder Labs: Lab Results  Component Value Date   HGBA1C 5.8 (H) 01/03/2019   MPG 119.76 01/03/2019   No results found for: PROLACTIN Lab Results   Component Value Date   CHOL 185 01/03/2019   TRIG 108 01/03/2019   HDL 52 01/03/2019   CHOLHDL 3.6 01/03/2019   VLDL 22 01/03/2019   LDLCALC 111 (H) 01/03/2019   No results found for: TSH  Therapeutic Level Labs: No results found for: LITHIUM No results found for: VALPROATE No components found for:  CBMZ  Current Medications: Current Outpatient Medications  Medication Sig Dispense Refill  . acetaminophen (TYLENOL) 325 MG tablet Take 2 tablets (650 mg total) by mouth every 4 (four) hours as needed for mild pain (or temp > 37.5 C (99.5 F)).    Marland Kitchen amLODipine (NORVASC) 5 MG tablet Take 1 tablet (5 mg total) by mouth daily. (Patient taking differently: Take 5 mg by mouth daily. Take .25 mg daily) 30 tablet 0  . ARIPiprazole (ABILIFY) 5 MG tablet Take 1 tablet (5 mg total) by mouth daily. 90 tablet 0  . atorvastatin (LIPITOR) 40 MG tablet Take 1 tablet (40 mg total) by mouth daily. 30 tablet 0  . cholecalciferol (VITAMIN D3) 25 MCG (1000 UT) tablet Take 1,000 Units by mouth daily.    Marland Kitchen lisinopril (ZESTRIL) 20 MG tablet Take 1 tablet (20 mg total) by mouth daily. 30 tablet 0  . lubiprostone (AMITIZA) 24 MCG capsule Take 24 mcg by mouth 2 (two) times daily as needed for constipation.    . Melatonin 3 MG TABS Take 2 tablets (6 mg total) by mouth at bedtime. 180 tablet 0  . metFORMIN (GLUCOPHAGE) 500 MG tablet Take 0.5 tablets (250 mg total) by mouth 2 (two) times daily with a meal. 60 tablet 0  . Omega-3 Fatty Acids (FISH OIL) 1000 MG CPDR Take 1 capsule by mouth daily.    . pantoprazole (PROTONIX) 40 MG tablet Take 1 tablet (40 mg total) by mouth at bedtime. 30 tablet 0  . polyethylene glycol (MIRALAX / GLYCOLAX) 17 g packet Take 17 g by mouth 2 (two) times daily. 14 each 0  . senna-docusate (SENOKOT-S) 8.6-50 MG tablet Take 1 tablet by mouth 2 (two) times daily.    . tamsulosin (FLOMAX) 0.4 MG CAPS capsule Take 1 capsule (0.4 mg total) by mouth at bedtime. 30 capsule 0  . venlafaxine XR  (EFFEXOR-XR) 150 MG 24 hr capsule Take 2 capsules (300 mg total) by mouth daily with breakfast. 60 capsule 2  . vitamin C (ASCORBIC ACID) 500 MG tablet Take 500 mg by mouth daily.     No current facility-administered medications for this visit.      Psychiatric Specialty Exam: Review of Systems  Neurological: Positive for weakness.  Psychiatric/Behavioral: Positive for depression.  All other systems reviewed and are negative.   There were no vitals taken for this visit.There is no height or weight on file to calculate BMI.  General Appearance: NA  Eye Contact:  NA  Speech:  Clear and Coherent and Normal Rate  Volume:  Normal  Mood:  Depressed  Affect:  NA  Thought Process:  Goal Directed and Linear  Orientation:  Full (Time, Place, and Person)  Thought Content: Logical   Suicidal Thoughts:  No  Homicidal Thoughts:  No  Memory:  Immediate;   Good Recent;   Good Remote;   Good  Judgement:  Good  Insight:  Good  Psychomotor Activity:  NA  Concentration:  Concentration: Good  Recall:  Good  Fund of Knowledge: Good  Language: Good  Akathisia:  Negative  Handed:  Right  AIMS (if indicated): not done  Assets:  Communication Skills Desire for Improvement Financial Resources/Insurance Housing Resilience Social Support  ADL's:  Intact  Cognition: WNL  Sleep:  Fair   Screenings: ECT-MADRS     [facility]  MADRS Total Score  12       Assessment and Plan: 73 yo married male, [place] Pollard, who presents Copywithrecurrentmajor depression(and hx of PTSD)which severity waxes and weans but as far as he can tel it has neverfullyremitted. He has been on 300 mg dose of xr venlafaxine for many years. He has a hx of SIbut not recently. He does not wish to change antidepressants (has not tried Trintellix, Viibryd, [place]) but was open to a trial of Abilify to augment his Effexor.We have added 2 mg of  aripiprazole to venlafaxine and Sharlet SalinaBenjamin noticed almost immediate improvement in mood.The dose was then increased to 5 mg in hope of achieving full remission. Unfortunately in March K[place] suffered a right thalamic stroke thought to be related to increased blood pressure. He is slowly recovering; speech has normalized but left sided weakness still present - he works with PT on his strength, gait. CVA was a major setback and his mood has declined some. He remains positive looking and working towards full recovery of function. He reports no nightmares and no problems with sleep: typically sleeps 12 hours straight but then c/o back pain. He takes 6 mg of melatonin at bedtime with good effect. His appetite is good. He denies feeling hopeless or suicidal.He hasbeen prescribedlorazepam prn anxiety but does not use it anymore.In the past he has expressedinterest in trying ECT but given his mood improvement on a combination of Effexor/Abilify it is not entertained at this time..  Dx: MDD recurrent mild  Plan: We will continue Effexor XR, Abilify 5 mg and melatonin to 6 mg. Next appointment in 3 months or earlier if needed. The plan was discussed with patient who had an opportunity to ask questions and these were all answered. I spend25 minutes inphone consultation with the patient.    Magdalene Patricialgierd A Clemens Lachman, MD 07/23/2019, 2:11 PM

## 2019-10-23 ENCOUNTER — Ambulatory Visit (INDEPENDENT_AMBULATORY_CARE_PROVIDER_SITE_OTHER): Payer: No Typology Code available for payment source | Admitting: Psychiatry

## 2019-10-23 ENCOUNTER — Other Ambulatory Visit: Payer: Self-pay

## 2019-10-23 DIAGNOSIS — F330 Major depressive disorder, recurrent, mild: Secondary | ICD-10-CM | POA: Diagnosis not present

## 2019-10-23 DIAGNOSIS — F4310 Post-traumatic stress disorder, unspecified: Secondary | ICD-10-CM | POA: Diagnosis not present

## 2019-10-23 MED ORDER — ARIPIPRAZOLE 5 MG PO TABS: 5.0000 mg | ORAL_TABLET | Freq: Every day | ORAL | 1 refills | Status: AC

## 2019-10-23 MED ORDER — VENLAFAXINE HCL ER 150 MG PO CP24: 300.0000 mg | ORAL_CAPSULE | Freq: Every day | ORAL | 1 refills | Status: AC

## 2019-10-23 NOTE — Progress Notes (Signed)
BH MD/PA/NP OP Progress Note  10/23/2019 3:47 PM Harinder Romas  MRN:  [id number] Interview was conducted by phone and I verified that I was speaking with the correct person using two identifiers. I discussed the limitations of evaluation and management by telemedicine and  the availability of in person appointments. Patient expressed understanding and agreed to proceed.  Chief Complaint: Still some depression.  HPI: 74 yo married male, [place] war veteran, who presents Copy depression(and hx of PTSD)which severity waxes and weans but as far as he can tel it has neverfullyremitted. He has been on 300 mg dose of xr venlafaxine for many years. He has a hx of SIbut not recently. He does not wish to change antidepressants (has not tried Trintellix, Viibryd, [place]) but was open to a trial of Abilify to augment his Effexor.We have added 2 mg of aripiprazole to venlafaxine and Sayid noticed almost immediate improvement in mood.The dose was then increased to 5 mg in hope of achieving full remission. Unfortunatelyin MarchBenjamin suffered a right thalamic stroke thought to be related to increased blood pressure. He is slowly recovering; speech hasnormalized butleft sided weakness still present - he works with PTon his strength, gait.CVA wasa major setback and his mood has declined some. He remains positive looking and working towards full recovery of function.He reports no nightmares and no problems with sleep: typically sleeps 12 hours straight but then c/o back pain. He takes 6 mg of melatonin at bedtime. His appetite is good. He denies feeling hopeless or suicidal.He hasbeen prescribedlorazepam prn anxiety butdoes not use it anymore.   Visit Diagnosis:    ICD-10-CM   1. Major depressive disorder, recurrent episode, mild (HCC)  F33.0     Past Psychiatric History: Please see intake H&P.  Past Medical History:  Past Medical History:  Diagnosis Date  . Anxiety    . Colon polyps    s/p colonoscopy 12/2016 wtih polyp removal   . Depression    No past surgical history on file.  Family Psychiatric History: None.  Family History: No family history on file.  Social History:  Social History   Socioeconomic History  . Marital status: Married    Spouse name: Not on file  . Number of children: 3  . Years of education: Not on file  . Highest education level: Some college, no degree  Occupational History  . Not on file  Tobacco Use  . Smoking status: Former Smoker    Years: 37.00    Types: Cigarettes    Quit date: 12/31/1996    Years since quitting: 22.8  . Smokeless tobacco: Never Used  Substance and Sexual Activity  . Alcohol use: Not Currently  . Drug use: Never  . Sexual activity: Not on file  Other Topics Concern  . Not on file  Social History Narrative  . Not on file   Social Determinants of Health   Financial Resource Strain: Low Risk   . Difficulty of Paying Living Expenses: Not hard at all  Food Insecurity: No Food Insecurity  . Worried About Programme researcher, broadcasting/film/video in the Last Year: Never true  . Ran Out of Food in the Last Year: Never true  Transportation Needs: Unknown  . Lack of Transportation (Medical): No  . Lack of Transportation (Non-Medical): Not on file  Physical Activity:   . Days of Exercise per Week: Not on file  . Minutes of Exercise per Session: Not on file  Stress: No Stress Concern Present  . Feeling of Stress :  Only a little  Social Connections: Unknown  . Frequency of Communication with Friends and Family: Not on file  . Frequency of Social Gatherings with Friends and Family: Not on file  . Attends Religious Services: More than 4 times per year  . Active Member of Clubs or Organizations: No  . Attends Banker Meetings: Never  . Marital Status: Married    Allergies:  Allergies  Allergen Reactions  . Rocephin [Ceftriaxone] Other (See Comments)    Light headed, and nearly passed out     Metabolic Disorder Labs: Lab Results  Component Value Date   HGBA1C 5.8 (H) 01/03/2019   MPG 119.76 01/03/2019   No results found for: PROLACTIN Lab Results  Component Value Date   CHOL 185 01/03/2019   TRIG 108 01/03/2019   HDL 52 01/03/2019   CHOLHDL 3.6 01/03/2019   VLDL 22 01/03/2019   LDLCALC 111 (H) 01/03/2019   No results found for: TSH  Therapeutic Level Labs: No results found for: LITHIUM No results found for: VALPROATE No components found for:  CBMZ  Current Medications: Current Outpatient Medications  Medication Sig Dispense Refill  . acetaminophen (TYLENOL) 325 MG tablet Take 2 tablets (650 mg total) by mouth every 4 (four) hours as needed for mild pain (or temp > 37.5 C (99.5 F)).    Marland Kitchen amLODipine (NORVASC) 5 MG tablet Take 1 tablet (5 mg total) by mouth daily. (Patient taking differently: Take 5 mg by mouth daily. Take .25 mg daily) 30 tablet 0  . ARIPiprazole (ABILIFY) 5 MG tablet Take 1 tablet (5 mg total) by mouth daily. 90 tablet 1  . atorvastatin (LIPITOR) 40 MG tablet Take 1 tablet (40 mg total) by mouth daily. 30 tablet 0  . cholecalciferol (VITAMIN D3) 25 MCG (1000 UT) tablet Take 1,000 Units by mouth daily.    Marland Kitchen lisinopril (ZESTRIL) 20 MG tablet Take 1 tablet (20 mg total) by mouth daily. 30 tablet 0  . lubiprostone (AMITIZA) 24 MCG capsule Take 24 mcg by mouth 2 (two) times daily as needed for constipation.    . metFORMIN (GLUCOPHAGE) 500 MG tablet Take 0.5 tablets (250 mg total) by mouth 2 (two) times daily with a meal. 60 tablet 0  . Omega-3 Fatty Acids (FISH OIL) 1000 MG CPDR Take 1 capsule by mouth daily.    . pantoprazole (PROTONIX) 40 MG tablet Take 1 tablet (40 mg total) by mouth at bedtime. 30 tablet 0  . polyethylene glycol (MIRALAX / GLYCOLAX) 17 g packet Take 17 g by mouth 2 (two) times daily. 14 each 0  . senna-docusate (SENOKOT-S) 8.6-50 MG tablet Take 1 tablet by mouth 2 (two) times daily.    . tamsulosin (FLOMAX) 0.4 MG CAPS capsule  Take 1 capsule (0.4 mg total) by mouth at bedtime. 30 capsule 0  . venlafaxine XR (EFFEXOR-XR) 150 MG 24 hr capsule Take 2 capsules (300 mg total) by mouth daily with breakfast. 180 capsule 1  . vitamin C (ASCORBIC ACID) 500 MG tablet Take 500 mg by mouth daily.     No current facility-administered medications for this visit.     Psychiatric Specialty Exam: Review of Systems  Neurological: Positive for weakness.  All other systems reviewed and are negative.   There were no vitals taken for this visit.There is no height or weight on file to calculate BMI.  General Appearance: NA  Eye Contact:  NA  Speech:  Clear and Coherent and Normal Rate  Volume:  Normal  Mood:  Some situational depression  Affect:  NA  Thought Process:  Goal Directed and Linear  Orientation:  Full (Time, Place, and Person)  Thought Content: Logical   Suicidal Thoughts:  No  Homicidal Thoughts:  No  Memory:  Immediate;   Good Recent;   Good Remote;   Good  Judgement:  Good  Insight:  Good  Psychomotor Activity:  NA  Concentration:  Concentration: Good  Recall:  Good  Fund of Knowledge: Good  Language: Good  Akathisia:  Negative  Handed:  Right  AIMS (if indicated): not done  Assets:  Communication Skills Desire for Improvement Financial Resources/Insurance Housing Resilience Social Support  ADL's:  Intact  Cognition: WNL  Sleep:  Fair   Screenings: ECT-MADRS     Office Visit from 12/04/2018 in [facility]  MADRS Total Score  12       Assessment and Plan: 74 yo married male, [place] war veteran, who presents Neurosurgeon depression(and hx of PTSD)which severity waxes and weans but as far as he can tel it has neverfullyremitted. He has been on 300 mg dose of xr venlafaxine for many years. He has a hx of SIbut not recently. He does not wish to change antidepressants (has not tried Trintellix, Viibryd, [place]) but was open to a trial of Abilify  to augment his Effexor.We have added 2 mg of aripiprazole to venlafaxine and Olaf noticed almost immediate improvement in mood.The dose was then increased to 5 mg in hope of achieving full remission. Unfortunatelyin MarchBenjamin suffered a right thalamic stroke thought to be related to increased blood pressure. He is slowly recovering; speech hasnormalized butleft sided weakness still present - he works with PTon his strength, gait.CVA wasa major setback and his mood has declined some. He remains positive looking and working towards full recovery of function.He reports no nightmares and no problems with sleep: typically sleeps at least 8 hours straight. He takes 6 mg of melatonin at bedtime. His appetite is good. He denies feeling hopeless or suicidal.He hasbeen prescribedlorazepam prn anxiety butdoes not use it anymore.  Dx: MDD recurrent mild  Plan: We will continue Effexor XR, Abilify5 mg and melatoninto 6 mg (OTC). Nextappointment in 3 months or earlier if needed. The plan was discussed with patient who had an opportunity to ask questions and these were all answered. I spend25 minutes inphone consultation with the patient.    Stephanie Acre, MD 10/23/2019, 3:47 PM

## 2020-01-21 ENCOUNTER — Other Ambulatory Visit: Payer: Self-pay

## 2020-01-21 ENCOUNTER — Ambulatory Visit (HOSPITAL_COMMUNITY): Payer: Medicare PPO | Admitting: Psychiatry

## 2020-04-21 DIAGNOSIS — N390 Urinary tract infection, site not specified: Secondary | ICD-10-CM

## 2020-04-21 DIAGNOSIS — R1030 Lower abdominal pain, unspecified: Secondary | ICD-10-CM

## 2020-04-21 HISTORY — DX: Lower abdominal pain, unspecified: R10.30

## 2020-04-21 HISTORY — DX: Urinary tract infection, site not specified: N39.0

## 2021-03-23 ENCOUNTER — Emergency Department (HOSPITAL_COMMUNITY): Payer: No Typology Code available for payment source

## 2021-03-23 ENCOUNTER — Emergency Department (HOSPITAL_COMMUNITY)
Admission: EM | Admit: 2021-03-23 | Discharge: 2021-03-23 | Disposition: A | Discharge status: Home / Self Care | Payer: No Typology Code available for payment source | Attending: Emergency Medicine | Admitting: Emergency Medicine

## 2021-03-23 ENCOUNTER — Encounter (HOSPITAL_COMMUNITY): Payer: Self-pay

## 2021-03-23 ENCOUNTER — Other Ambulatory Visit: Payer: Self-pay

## 2021-03-23 DIAGNOSIS — Z87891 Personal history of nicotine dependence: Secondary | ICD-10-CM | POA: Insufficient documentation

## 2021-03-23 DIAGNOSIS — R413 Other amnesia: Secondary | ICD-10-CM | POA: Diagnosis not present

## 2021-03-23 DIAGNOSIS — Y9389 Activity, other specified: Secondary | ICD-10-CM | POA: Diagnosis not present

## 2021-03-23 DIAGNOSIS — Z20822 Contact with and (suspected) exposure to covid-19: Secondary | ICD-10-CM | POA: Insufficient documentation

## 2021-03-23 DIAGNOSIS — I10 Essential (primary) hypertension: Secondary | ICD-10-CM | POA: Insufficient documentation

## 2021-03-23 DIAGNOSIS — S0990XA Unspecified injury of head, initial encounter: Secondary | ICD-10-CM

## 2021-03-23 DIAGNOSIS — W19XXXA Unspecified fall, initial encounter: Secondary | ICD-10-CM

## 2021-03-23 DIAGNOSIS — W01198A Fall on same level from slipping, tripping and stumbling with subsequent striking against other object, initial encounter: Secondary | ICD-10-CM | POA: Diagnosis not present

## 2021-03-23 LAB — CBC WITH DIFFERENTIAL/PLATELET
Abs Immature Granulocytes: 0.07 10*3/uL (ref 0.00–0.07)
Basophils Absolute: 0.1 10*3/uL (ref 0.0–0.1)
Basophils Relative: 1 %
Eosinophils Absolute: 0 10*3/uL (ref 0.0–0.5)
Eosinophils Relative: 0 %
HCT: 47.7 % (ref 39.0–52.0)
Hemoglobin: 15.8 g/dL (ref 13.0–17.0)
Immature Granulocytes: 1 %
Lymphocytes Relative: 12 %
Lymphs Abs: 1.2 10*3/uL (ref 0.7–4.0)
MCH: 30 pg (ref 26.0–34.0)
MCHC: 33.1 g/dL (ref 30.0–36.0)
MCV: 90.5 fL (ref 80.0–100.0)
Monocytes Absolute: 0.5 10*3/uL (ref 0.1–1.0)
Monocytes Relative: 5 %
Neutro Abs: 8.2 10*3/uL — ABNORMAL HIGH (ref 1.7–7.7)
Neutrophils Relative %: 81 %
Platelets: 284 10*3/uL (ref 150–400)
RBC: 5.27 MIL/uL (ref 4.22–5.81)
RDW: 13.5 % (ref 11.5–15.5)
WBC: 10 10*3/uL (ref 4.0–10.5)
nRBC: 0 % (ref 0.0–0.2)

## 2021-03-23 LAB — COMPREHENSIVE METABOLIC PANEL
ALT: 13 U/L (ref 0–44)
AST: 16 U/L (ref 15–41)
Albumin: 4.1 g/dL (ref 3.5–5.0)
Alkaline Phosphatase: 52 U/L (ref 38–126)
Anion gap: 10 (ref 5–15)
BUN: 12 mg/dL (ref 8–23)
CO2: 28 mmol/L (ref 22–32)
Calcium: 9.6 mg/dL (ref 8.9–10.3)
Chloride: 98 mmol/L (ref 98–111)
Creatinine, Ser: 0.75 mg/dL (ref 0.61–1.24)
GFR, Estimated: >60 mL/min (ref >60)
Glucose, Bld: 126 mg/dL — ABNORMAL HIGH (ref 70–99)
Potassium: 3.7 mmol/L (ref 3.5–5.1)
Sodium: 136 mmol/L (ref 135–145)
Total Bilirubin: 0.4 mg/dL (ref 0.3–1.2)
Total Protein: 7.5 g/dL (ref 6.5–8.1)

## 2021-03-23 LAB — ETHANOL: Alcohol, Ethyl (B): <10 mg/dL (ref <10)

## 2021-03-23 LAB — RAPID URINE DRUG SCREEN, HOSP PERFORMED
Amphetamines: NOT DETECTED
Barbiturates: NOT DETECTED
Benzodiazepines: POSITIVE — AB
Cocaine: NOT DETECTED
Opiates: NOT DETECTED
Tetrahydrocannabinol: POSITIVE — AB

## 2021-03-23 LAB — LIPASE, BLOOD: Lipase: 75 U/L — ABNORMAL HIGH (ref 11–51)

## 2021-03-23 LAB — RESP PANEL BY RT-PCR (FLU A&B, COVID) ARPGX2
Influenza A by PCR: NEGATIVE
Influenza B by PCR: NEGATIVE
SARS Coronavirus 2 by RT PCR: NEGATIVE

## 2021-03-23 LAB — TROPONIN I (HIGH SENSITIVITY)
Troponin I (High Sensitivity): 6 ng/L (ref <18)
Troponin I (High Sensitivity): 4 ng/L (ref <18)

## 2021-03-23 LAB — TSH: TSH: 2.057 u[IU]/mL (ref 0.350–4.500)

## 2021-03-23 MED ORDER — SODIUM CHLORIDE 0.9 % IV BOLUS
500.0000 mL | Freq: Once | INTRAVENOUS | Status: AC
  Administered 2021-03-23: 500 mL via INTRAVENOUS

## 2021-03-23 NOTE — Discharge Instructions (Addendum)
You were seen in the emergency department today after a fall.  I suspect that you have a concussion following this event.  Please follow closely with your primary care doctor.  Return to the emergency department any new or suddenly worsening symptoms.

## 2021-03-23 NOTE — ED Triage Notes (Addendum)
Pt BIB EMS from home due to syncopal episodes. Per EMS pt wife reported he fell. Per EMS pt hit his head. Unknown LOC. Pt is not on any blood thinners per EMS.Pt has h/o stroke with left sided deficits. Pt unable to recall event Pt fall witnessed by wife

## 2021-03-23 NOTE — ED Provider Notes (Signed)
Emergency Department Provider Note   I have reviewed the triage vital signs and the nursing notes.   HISTORY  Chief Complaint Fall   HPI Patrick Pollard is a 75 y.o. male with past medical history of prior stroke with left side weakness, depression, and HLD presents to the emergency department for evaluation after fall.  Patient does not recall the circumstances surrounding the fall.  He states that he has been tapering off of his Effexor and thinks he may have missed the last 2 days entirely by mistake.  He is due to continue tapering down over the next several days and then start duloxetine.  He lives at home with his wife and daughter.  He ambulates with a hemiwalker typically without difficulty.  Patient denies any generalized weakness, chest pain, abdominal pain, fevers.  No heart palpitations.   I spoke with the patient's wife and daughter by phone.  They confirm the patient has missed the last 2 days of his Effexor and they think that is made him feel dizzy.  They state today he had to get up and go to the bathroom and tripped over his feet falling backwards hitting the back of his head on the ground.  They deny any syncope or seizure activity.  He seemed more weak than normal and so EMS was called as he was unable to get up on his own.  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) 01/14/2019   AKI (acute kidney injury) (HCC)    Colonic mass    Sleep disturbance    Hyponatremia    Prediabetes    Slow transit constipation    Essential hypertension    Labile blood pressure    Hypertensive emergency 01/04/2019   Hyperlipemia  01/04/2019   BPH (benign prostatic hyperplasia) 01/04/2019   Thyroid mass, left 01/04/2019   Left-sided intracerebral hemorrhage (HCC) 01/04/2019   ICH (intracerebral hemorrhage) (HCC) R Thalamic d/t HTN 01/01/2019   Chronic post-traumatic stress disorder (PTSD) 12/25/2018   Major depressive disorder, recurrent episode, mild (HCC) 12/04/2018   GIB (gastrointestinal bleeding) 12/31/2017    History reviewed. No pertinent surgical history.  Allergies Rocephin [ceftriaxone]  History reviewed. No pertinent family history.  Social History Social History   Tobacco Use   Smoking status: Former    Years: 37.00    Pack years: 0.00    Types: Cigarettes    Quit date: 12/31/1996    Years since quitting: 24.2   Smokeless tobacco: Never  Vaping Use   Vaping Use: Never used  Substance Use Topics   Alcohol use: Not Currently   Drug use: Never    Review of Systems  Constitutional: No fever/chills Eyes: No visual changes. ENT: No sore throat. Cardiovascular: Denies chest pain. Respiratory: Denies shortness of breath. Gastrointestinal: No abdominal pain.  No nausea, no vomiting.  No diarrhea.  No constipation. Genitourinary: Negative for dysuria. Musculoskeletal: Negative for back pain. Skin: Negative for rash. Neurological: Baseline left side weakness. Positive HA.   10-point ROS otherwise negative.  ____________________________________________   PHYSICAL EXAM:  VITAL SIGNS: ED Triage Vitals [03/23/21 1509]  Enc Vitals Group  BP 129/90     Pulse Rate 87     Resp 18     Temp (!) 97.3 F (36.3 C)     Temp Source Oral     SpO2 98 %   Constitutional: Alert and oriented. Well appearing and in no acute distress. Eyes: Conjunctivae are normal.  Head: Atraumatic. Nose: No congestion/rhinnorhea. Mouth/Throat: Mucous membranes are slightly dry.  Neck: No stridor.  Cardiovascular: Normal rate, regular rhythm. Good peripheral circulation. Grossly normal heart sounds.    Respiratory: Normal respiratory effort.  No retractions. Lungs CTAB. Gastrointestinal: Soft and nontender. No distention.  Musculoskeletal: No lower extremity tenderness nor edema. No gross deformities of extremities. Neurologic:  Normal speech and language. Baseline left side weakness 4+/5. No other acute deficits.  Skin:  Skin is warm, dry and intact. No rash noted.   ____________________________________________   LABS (all labs ordered are listed, but only abnormal results are displayed)  Labs Reviewed  COMPREHENSIVE METABOLIC PANEL - Abnormal; Notable for the following components:      Result Value   Glucose, Bld 126 (*)    All other components within normal limits  LIPASE, BLOOD - Abnormal; Notable for the following components:   Lipase 75 (*)    All other components within normal limits  CBC WITH DIFFERENTIAL/PLATELET - Abnormal; Notable for the following components:   Neutro Abs 8.2 (*)    All other components within normal limits  RAPID URINE DRUG SCREEN, HOSP PERFORMED - Abnormal; Notable for the following components:   Benzodiazepines POSITIVE (*)    Tetrahydrocannabinol POSITIVE (*)    All other components within normal limits  RESP PANEL BY RT-PCR (FLU A&B, COVID) [id number]  ETHANOL  TSH  TROPONIN I (HIGH SENSITIVITY)  TROPONIN I (HIGH SENSITIVITY)   ____________________________________________  EKG   EKG Interpretation  Date/Time:  Tuesday March 23 2021 15:08:12 EDT Ventricular Rate:  86 PR Interval:  193 QRS Duration: 92 QT Interval:  367 QTC Calculation: 439 R Axis:   -8 Text Interpretation: Sinus rhythm Anterior infarct, old Similar to March 2020 tracing Confirmed by Alona Bene [phone]) on 03/23/2021 3:16:14 PM         ____________________________________________  RADIOLOGY  CT Head Wo Contrast  Result Date: 03/23/2021 CLINICAL DATA:  75 year old male with head trauma. EXAM: CT HEAD WITHOUT CONTRAST TECHNIQUE: Contiguous axial images were  obtained from the base of the skull through the vertex without intravenous contrast. COMPARISON:  Head CT dated 01/02/2019 FINDINGS: Brain: Mild age-related atrophy and chronic microvascular ischemic changes. There is no acute intracranial hemorrhage. No mass effect or midline shift no extra-axial fluid collection. Vascular: No hyperdense vessel or unexpected calcification. Skull: Normal. Negative for fracture or focal lesion. Sinuses/Orbits: No acute finding. Other: None IMPRESSION: 1. No acute intracranial pathology. 2. Mild age-related atrophy and chronic microvascular ischemic changes. Electronically Signed   By: Elgie Collard M.D.   On: 03/23/2021 16:52   MR BRAIN WO CONTRAST  Result Date: 03/23/2021 CLINICAL DATA:  Encephalopathy EXAM: MRI HEAD WITHOUT CONTRAST TECHNIQUE: Multiplanar, multiecho pulse sequences of the brain and surrounding structures were obtained without intravenous contrast. COMPARISON:  None. FINDINGS: Brain: No acute infarct, mass effect or extra-axial collection. No acute or chronic hemorrhage. Hyperintense T2-weighted signal is widespread throughout the white matter. Diffuse, severe atrophy. Old infarct of the periventricular right frontal white matter. The midline structures are normal. Vascular: Major flow voids are preserved. Skull and upper cervical spine: Normal calvarium and skull base. Visualized upper cervical spine and soft  tissues are normal. Sinuses/Orbits:No paranasal sinus fluid levels or advanced mucosal thickening. No mastoid or middle ear effusion. Normal orbits. IMPRESSION: 1. No acute intracranial abnormality. 2. Diffuse, severe atrophy and chronic ischemic microangiopathy. Electronically Signed   By: Deatra Robinson M.D.   On: 03/23/2021 21:34   DG Chest Portable 1 View  Result Date: 03/23/2021 CLINICAL DATA:  Syncopal episodes. EXAM: PORTABLE CHEST 1 VIEW COMPARISON:  Single-view of the chest 12/31/2017. FINDINGS: Lungs clear. Heart size normal. Elevation left  hemidiaphragm relative to the right noted. No pneumothorax or pleural fluid. No acute or focal bony abnormality. IMPRESSION: No acute disease. Electronically Signed   By: Drusilla Kanner M.D.   On: 03/23/2021 15:35    ____________________________________________   PROCEDURES  Procedure(s) performed:   Procedures  None ____________________________________________   INITIAL IMPRESSION / ASSESSMENT AND PLAN / ED COURSE  Pertinent labs & imaging results that were available during my care of the patient were reviewed by me and considered in my medical decision making (see chart for details).   Patient presents to the emergency department with history initially of syncope but after discussion with the patient's wife and daughter by phone it sounds like he tripped and fell.  Patient does not recall the event.  He is not having any pain.  Vital signs here are normal.   CT imaging and labs reviewed. No acute findings. MRI obtained to r/o new CVA with amnesia to the event. MRI negative. Suspect amnesia to event related to concussion. Family give a good history of mechanical fall w/o syncope.  ____________________________________________  FINAL CLINICAL IMPRESSION(S) / ED DIAGNOSES  Final diagnoses:  Injury of head, initial encounter  Fall, initial encounter  Amnesia     MEDICATIONS GIVEN DURING THIS VISIT:  Medications  sodium chloride 0.9 % bolus 500 mL (0 mLs Intravenous Stopped 03/23/21 2145)     Note:  This document was prepared using Dragon voice recognition software and may include unintentional dictation errors.  Alona Bene, MD, [facility] Emergency Medicine    Honour Schwieger, Arlyss Repress, MD 03/24/21 [phone]

## 2021-04-07 ENCOUNTER — Encounter: Payer: Self-pay | Admitting: Psychology

## 2021-07-22 ENCOUNTER — Ambulatory Visit (INDEPENDENT_AMBULATORY_CARE_PROVIDER_SITE_OTHER): Payer: No Typology Code available for payment source | Admitting: Psychology

## 2021-07-22 ENCOUNTER — Other Ambulatory Visit: Payer: Self-pay

## 2021-07-22 ENCOUNTER — Encounter: Payer: Self-pay | Admitting: Psychology

## 2021-07-22 ENCOUNTER — Ambulatory Visit: Payer: No Typology Code available for payment source | Admitting: Psychology

## 2021-07-22 DIAGNOSIS — I69154 Hemiplegia and hemiparesis following nontraumatic intracerebral hemorrhage affecting left non-dominant side: Secondary | ICD-10-CM | POA: Insufficient documentation

## 2021-07-22 DIAGNOSIS — M6530 Trigger finger, unspecified finger: Secondary | ICD-10-CM | POA: Insufficient documentation

## 2021-07-22 DIAGNOSIS — M19042 Primary osteoarthritis, left hand: Secondary | ICD-10-CM | POA: Insufficient documentation

## 2021-07-22 DIAGNOSIS — H918X3 Other specified hearing loss, bilateral: Secondary | ICD-10-CM | POA: Insufficient documentation

## 2021-07-22 DIAGNOSIS — M25532 Pain in left wrist: Secondary | ICD-10-CM | POA: Insufficient documentation

## 2021-07-22 DIAGNOSIS — I619 Nontraumatic intracerebral hemorrhage, unspecified: Secondary | ICD-10-CM

## 2021-07-22 DIAGNOSIS — H6120 Impacted cerumen, unspecified ear: Secondary | ICD-10-CM | POA: Insufficient documentation

## 2021-07-22 DIAGNOSIS — E7800 Pure hypercholesterolemia, unspecified: Secondary | ICD-10-CM | POA: Insufficient documentation

## 2021-07-22 DIAGNOSIS — F411 Generalized anxiety disorder: Secondary | ICD-10-CM | POA: Insufficient documentation

## 2021-07-22 DIAGNOSIS — F01A0 Vascular dementia, mild, without behavioral disturbance, psychotic disturbance, mood disturbance, and anxiety: Secondary | ICD-10-CM

## 2021-07-22 DIAGNOSIS — M25552 Pain in left hip: Secondary | ICD-10-CM | POA: Insufficient documentation

## 2021-07-22 DIAGNOSIS — R7302 Impaired glucose tolerance (oral): Secondary | ICD-10-CM | POA: Insufficient documentation

## 2021-07-22 DIAGNOSIS — R4189 Other symptoms and signs involving cognitive functions and awareness: Secondary | ICD-10-CM

## 2021-07-22 DIAGNOSIS — M720 Palmar fascial fibromatosis [Dupuytren]: Secondary | ICD-10-CM | POA: Insufficient documentation

## 2021-07-22 DIAGNOSIS — F331 Major depressive disorder, recurrent, moderate: Secondary | ICD-10-CM | POA: Diagnosis not present

## 2021-07-22 DIAGNOSIS — G890 Central pain syndrome: Secondary | ICD-10-CM | POA: Insufficient documentation

## 2021-07-22 DIAGNOSIS — H918X9 Other specified hearing loss, unspecified ear: Secondary | ICD-10-CM | POA: Insufficient documentation

## 2021-07-22 DIAGNOSIS — R9431 Abnormal electrocardiogram [ECG] [EKG]: Secondary | ICD-10-CM | POA: Insufficient documentation

## 2021-07-22 DIAGNOSIS — I999 Unspecified disorder of circulatory system: Secondary | ICD-10-CM

## 2021-07-22 HISTORY — DX: Unspecified disorder of circulatory system: I99.9

## 2021-07-22 HISTORY — DX: Vascular dementia, mild, without behavioral disturbance, psychotic disturbance, mood disturbance, and anxiety: F01.A0

## 2021-07-22 NOTE — Progress Notes (Signed)
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 encoding) of novel verbal information was variable, ranging from the well below average to average normative ranges. Spontaneous delayed recall (i.e., retrieval) of previously learned information was also variable, ranging from the well below average to average normative ranges. Retention rates were 44% across a story learning task, 40% (raw score of 2) across a list learning task, and 73% across a figure drawing task. Performance across recognition tasks was strong, suggesting evidence for information consolidation.   Results of emotional screening instruments suggested that recent symptoms of generalized anxiety were in the minimal range, while symptoms of depression were within the moderate range. A screening instrument assessing recent sleep quality suggested the presence of minimal sleep dysfunction.  Tables of Scores:   Note: This summary of test scores accompanies the interpretive report and should not be considered in isolation without reference to the appropriate sections in the text. Descriptors are based on appropriate normative data and may be adjusted based on clinical judgment. Terms such as "Within Normal Limits" and "Outside  Normal Limits" are used when a more specific description of the test score cannot be determined.       Percentile - Normative Descriptor > 98 - Exceptionally High 91-97 - Well Above Average 75-90 - Above Average 25-74 - Average 9-24 - Below Average 2-8 - Well Below Average < 2 - Exceptionally Low       Validity:   DESCRIPTOR       Dot Counting Test: --- --- Within Normal Limits  RBANS Effort Index: --- --- Within Normal Limits  WAIS-IV Reliable Digit Span: --- --- Within Normal Limits  D-KEFS Color Word Effort Index: --- --- Within Normal Limits       Orientation:      Raw Score Percentile   NAB Orientation, Form 1 29/29 --- ---       Cognitive Screening:      Raw Score Percentile   SLUMS: 26/30 --- ---       RBANS, Form A: Standard Score/ Scaled Score Percentile   Total Score 82 12 Below Average  Immediate Memory 83 13 Below Average    List Learning 5 5 Well Below Average    Story Memory 9 37 Average  Visuospatial/Constructional 78 7 Well Below Average    Figure Copy 5 5 Well Below Average    Line Orientation 15/20 26-50 Average  Language 90 25 Average    Picture Naming 10/10 51-75 Average    Semantic Fluency 6 9 Below Average  Attention 91 27 Average    Digit Span 10 50 Average    Coding 7 16 Below Average  Delayed Memory 93 32 Average    List Recall 2/10 10-16  Below Average    List Recognition 20/20 51-75 Average    Story Recall 5 5 Well Below Average    Story Recognition 12/12 69+ Average    Figure Recall 9 37 Average    Figure Recognition 7/8 53-69 Average       Intellectual Functioning:      Standard Score Percentile   Test of Premorbid Functioning: 102 55 Average       Attention/Executive Function:     Trail Making Test (TMT): Raw Score (T Score) Percentile     Part A 71 secs.,  0 errors (28) 2 Well Below Average    Part B 234 secs.,  1 error (33) 5 Well Below Average         Scaled Score Percentile   WAIS-IV Digit Span: 9 37 Average    Forward 12  75 Above Average    Backward 10 50 Average    Sequencing 4 2 Well Below Average        Scaled Score Percentile   WAIS-IV Similarities: 8 25 Average       D-KEFS Color-Word Interference Test: Raw Score (Scaled Score) Percentile     Color Naming 38 secs. (8) 25 Average    Word Reading 20 secs. (13) 84 Above Average    Inhibition 75 secs. (10) 50 Average      Total Errors 0 errors (13) 84 Above Average    Inhibition/Switching 75 secs. (10) 50 Average      Total Errors 6 errors (7) 16 Below Average       D-KEFS Verbal Fluency Test: Raw Score (Scaled Score) Percentile     Letter Total Correct 42 (12) 75 Above Average    Category Total Correct 34 (10) 50 Average    Category Switching Total Correct 13 (11) 63 Average    Category Switching Accuracy 12 (11) 63 Average      Total Set Loss Errors 2 (10) 50 Average      Total Repetition Errors 1 (12) 75 Above Average       NAB Executive Functions Module, Form 1: T Score Percentile     Judgment 68 96 Well Above Average       Language:     Verbal Fluency Test: Raw Score (T Score) Percentile     Phonemic Fluency (FAS) 42 (55) 69 Average    Animal Fluency 15 (46) 34 Average        NAB Language Module, Form 1: T Score Percentile     Auditory Comprehension 58 79 Above Average    Naming 30/31 (59) 82 Above Average       Visuospatial/Visuoconstruction:      Raw Score Percentile   Clock Drawing: 10/10 --- Within Normal Limits       NAB Spatial Module, Form 1: T Score Percentile     Visual Discrimination 50 50 Average        Scaled Score Percentile   WAIS-IV Visual Puzzles: 7 16 Below Average       Mood and Personality:      Raw Score Percentile   PROMIS Depression Questionnaire: 25 --- Moderate  PROMIS Anxiety Questionnaire: 9 --- None to Slight       Additional Questionnaires:      Raw Score Percentile   PROMIS Sleep Disturbance Questionnaire: 21 --- None to Slight   Informed Consent and Coding/Compliance:   The current  evaluation represents a clinical evaluation for the purposes previously outlined by the referral source and is in  no way reflective of a forensic evaluation.   Mr. Stelly was provided with a verbal description of the nature and purpose of the present neuropsychological evaluation. Also reviewed were the foreseeable risks and/or discomforts and benefits of the procedure, limits of confidentiality, and mandatory reporting requirements of this provider. The patient was given the opportunity to ask questions and receive answers about the evaluation. Oral consent to participate was provided by the patient.   This evaluation was conducted by Christia Reading, Ph.D., licensed clinical neuropsychologist. Mr. Bottger completed a clinical interview with Dr. Melvyn Novas, billed as one unit [phone], and 125 minutes of cognitive testing and scoring, billed as one unit [phone] and three additional units 96139. Psychometrist Milana Kidney, B.S., assisted Dr. Melvyn Novas with test administration and scoring procedures. As a separate and discrete service, Dr. Melvyn Novas spent a total of 160 minutes in interpretation and report writing billed as one unit [phone] and two units [id number].

## 2021-07-22 NOTE — Progress Notes (Signed)
   Psychometrician Note   Cognitive testing was administered to Patrick Pollard by Wallace Keller, B.S. (psychometrist) under the supervision of Dr. Newman Nickels, Ph.D., licensed psychologist on 07/22/21. Patrick Pollard did not appear overtly distressed by the testing session per behavioral observation or responses across self-report questionnaires. Rest breaks were offered.    The battery of tests administered was selected by Dr. Newman Nickels, Ph.D. with consideration to Patrick Pollard current level of functioning, the nature of his symptoms, emotional and behavioral responses during interview, level of literacy, observed level of motivation/effort, and the nature of the referral question. This battery was communicated to the psychometrist. Communication between Dr. Newman Nickels, Ph.D. and the psychometrist was ongoing throughout the evaluation and Dr. Newman Nickels, Ph.D. was immediately accessible at all times. Dr. Newman Nickels, Ph.D. provided supervision to the psychometrist on the date of this service to the extent necessary to assure the quality of all services provided.    Patrick Pollard will return within approximately 1-2 weeks for an interactive feedback session with Dr. Milbert Coulter at which time his test performances, clinical impressions, and treatment recommendations will be reviewed in detail. Patrick Pollard understands he can contact our office should he require our assistance before this time.  A total of 125 minutes of billable time were spent face-to-face with Patrick Pollard by the psychometrist. This includes both test administration and scoring time. Billing for these services is reflected in the clinical report generated by Dr. Newman Nickels, Ph.D.  This note reflects time spent with the psychometrician and does not include test scores or any clinical interpretations made by Dr. Milbert Coulter. The full report will follow in a separate note.

## 2021-07-23 ENCOUNTER — Encounter: Payer: Self-pay | Admitting: Psychology

## 2021-07-29 ENCOUNTER — Encounter: Payer: No Typology Code available for payment source | Admitting: Psychology

## 2021-08-03 ENCOUNTER — Other Ambulatory Visit: Payer: Self-pay

## 2021-08-03 ENCOUNTER — Ambulatory Visit (INDEPENDENT_AMBULATORY_CARE_PROVIDER_SITE_OTHER): Payer: No Typology Code available for payment source | Admitting: Psychology

## 2021-08-03 DIAGNOSIS — F01A0 Vascular dementia, mild, without behavioral disturbance, psychotic disturbance, mood disturbance, and anxiety: Secondary | ICD-10-CM

## 2021-08-03 NOTE — Progress Notes (Signed)
   Neuropsychology Feedback Session Patrick Pollard. [facility] [place] Department of Neurology  Reason for Referral:   Patrick Pollard is a 75 y.o. 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  Feedback:   Patrick Pollard completed a comprehensive neuropsychological evaluation on 07/22/2021. Please refer to that encounter for the full report and recommendations. Briefly, results suggested performance variability across numerous cognitive domains, including processing speed, complex attention, executive functioning, visuospatial abilities, and both encoding (i.e., learning) and retrieval aspects of memory. No consistent impairment was observed across any cognitive domain. 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 Mr. Coach' pattern of performance variability and areas of weakness across testing is certainly consistent with expected patterns of dysfunction in a primary vascular etiology. Visuospatial variability, when compared to strong performance across language tasks, would further suggest greater right hemisphere involvement, aligning well with his right-sided stroke.  Patrick Pollard was unaccompanied during the current telephone call. He was within his residence while I was within my office. I discussed the limitations of evaluation and management by telemedicine and the availability of in person appointments. Patrick Pollard expressed his understanding and agreed to proceed. Content of the current session focused on the results of his neuropsychological evaluation. Patrick Pollard  was given the opportunity to ask questions and his questions were answered. He was encouraged to reach out should additional questions arise. A copy of his report had been previously mailed to him prior to this telephone call.     Less than 16 minutes were spent conducting the current feedback session with Patrick Pollard.

## 2023-04-21 IMAGING — MR MR HEAD W/O CM
6 of 10 series · 29 of 48 positions shown · non-contrast
Comparison: None.

CLINICAL DATA: Encephalopathy

EXAM:
MRI HEAD WITHOUT CONTRAST
TECHNIQUE: Multiplanar, multiecho pulse sequences of the brain and surrounding
structures were obtained without intravenous contrast.

[Series 2: DWI · axial · 3.0mm · 0.94mm/px · z∈[-52,+105]mm · 9 of 108 slices shown (1 of 2)]
[im 1/108]
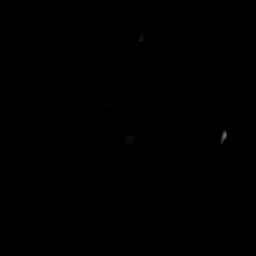
[im 14/108]
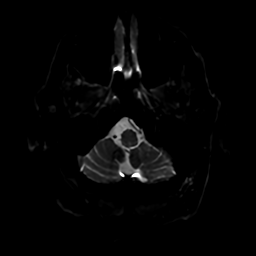
[im 27/108]
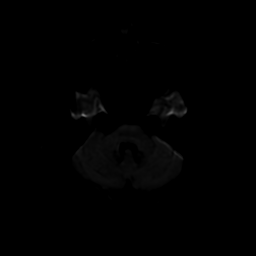
[im 41/108]
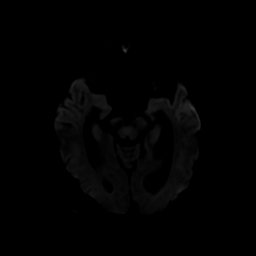
[im 54/108]
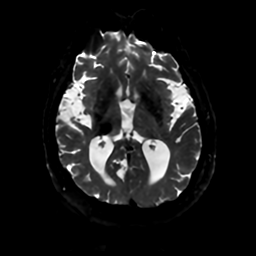
[im 67/108]
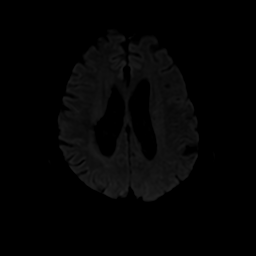
[im 81/108]
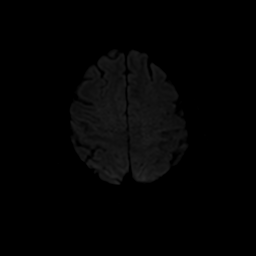
[im 94/108]
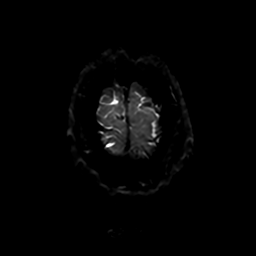
[im 108/108]
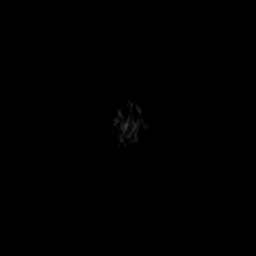

[Series 3: DWI · coronal · 4.0mm · 0.94mm/px · 7 of 78 slices shown (2 of 2)]
[im 1/78]
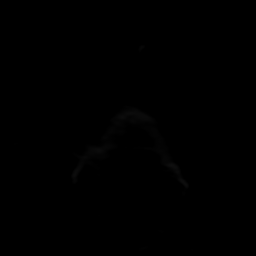
[im 13/78]
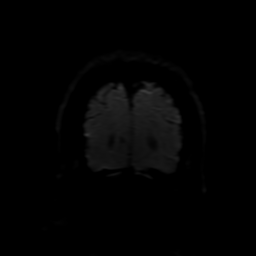
[im 26/78]
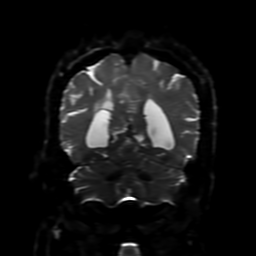
[im 39/78]
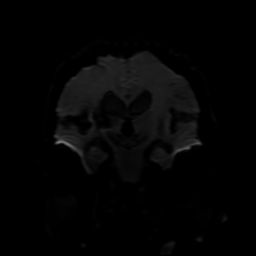
[im 52/78]
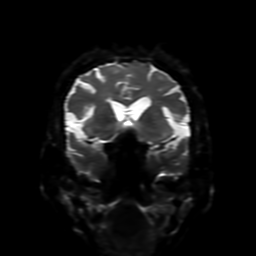
[im 65/78]
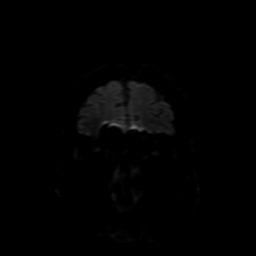
[im 78/78]
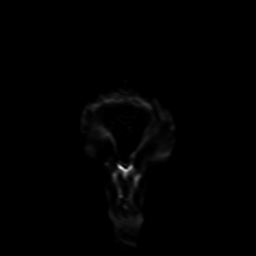

[Series 4: FLAIR · sagittal · 5.0mm · 0.23mm/px · 2 of 28 slices shown (1 of 2)]
[im 1/28]
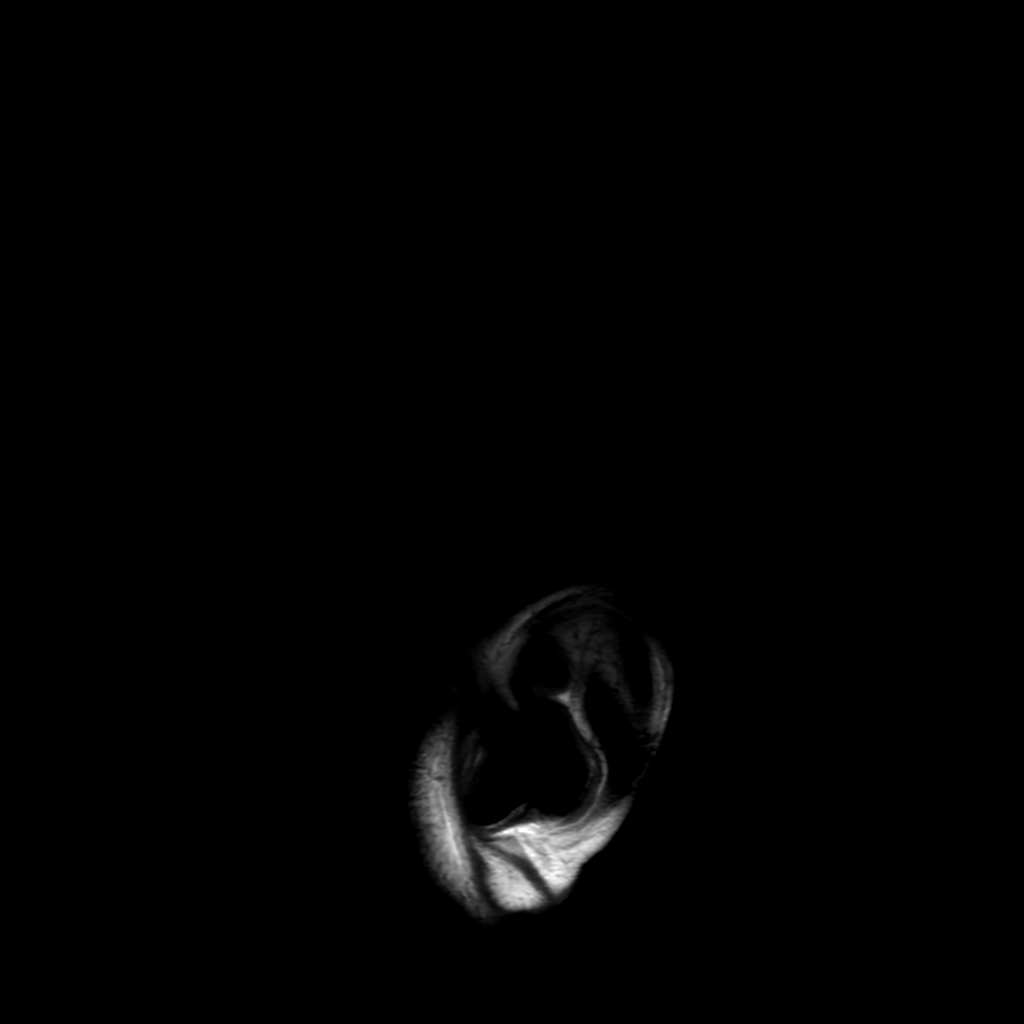
[im 28/28]
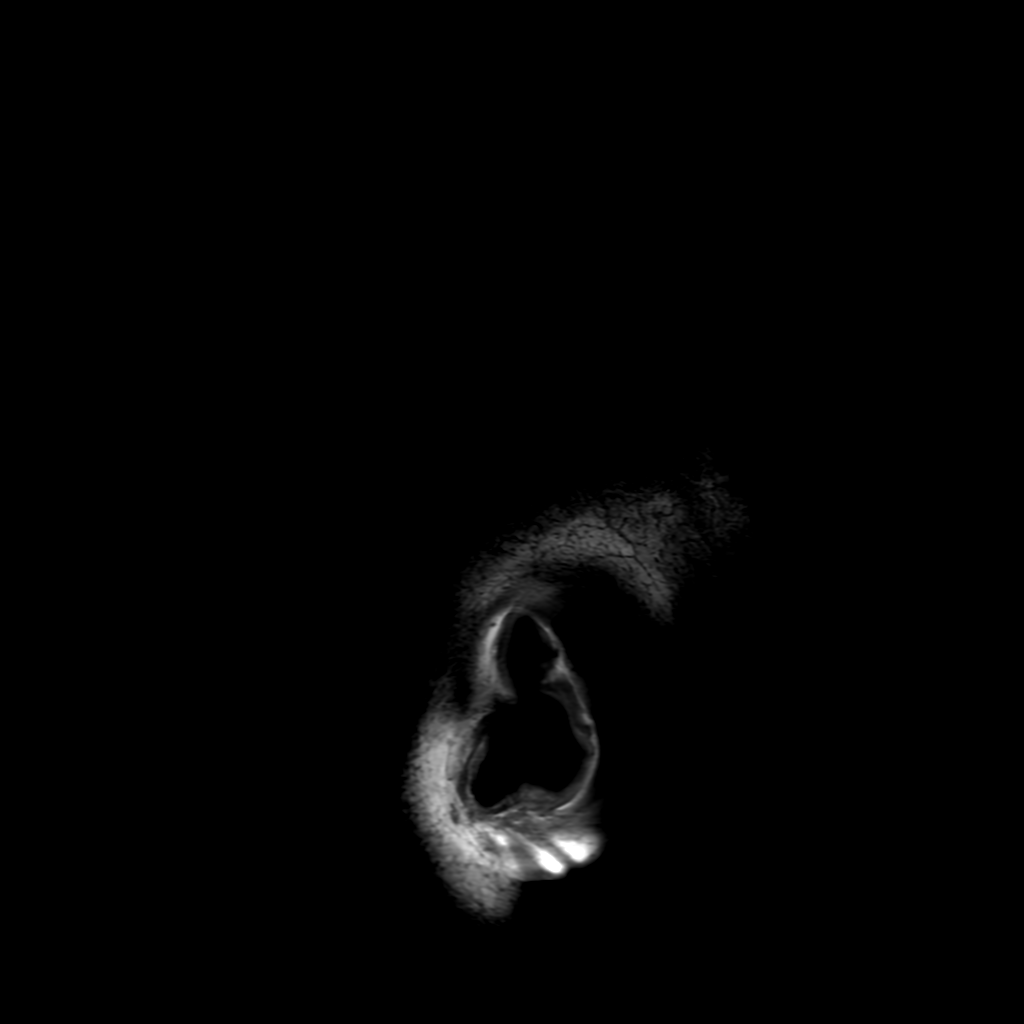

[Series 6: FLAIR · axial · 4.0mm · 0.45mm/px · z∈[-43,+112]mm · 3 of 37 slices shown (2 of 2)]
[im 1/37]
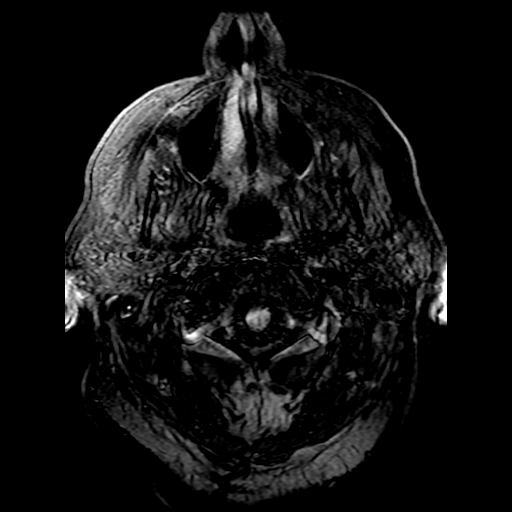
[im 19/37]
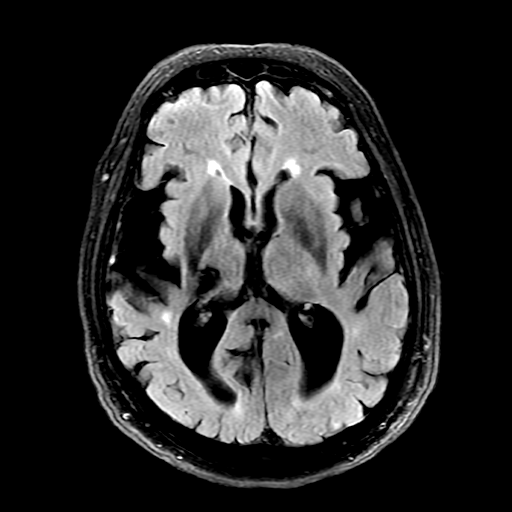
[im 37/37]
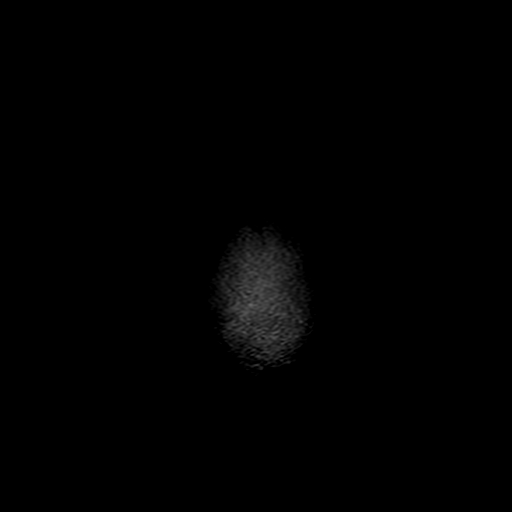

[Series 250: ADC · axial · 3.0mm · 0.94mm/px · z∈[-52,+105]mm · 5 of 54 slices shown (1 of 2)]
[im 1/54]
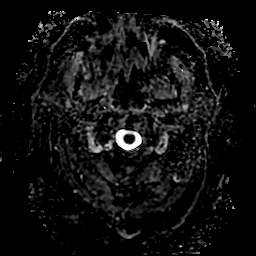
[im 14/54]
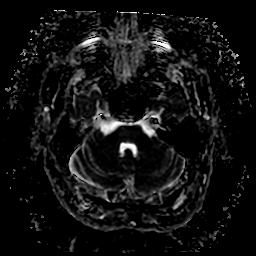
[im 27/54]
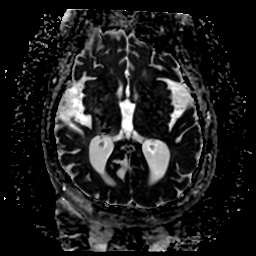
[im 40/54]
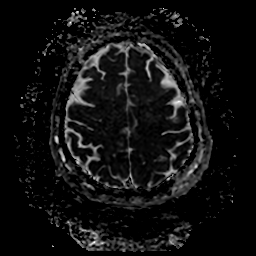
[im 54/54]
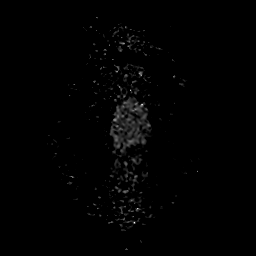

[Series 350: ADC · coronal · 4.0mm · 0.94mm/px · 3 of 39 slices shown (2 of 2)]
[im 1/39]
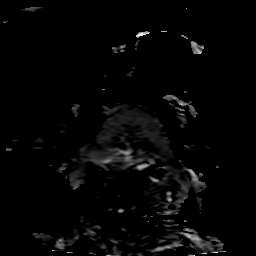
[im 20/39]
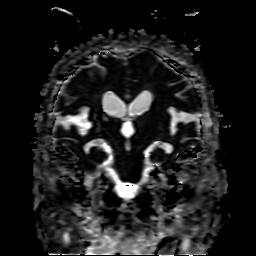
[im 39/39]
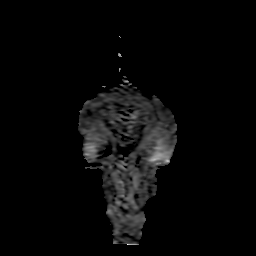

[29 of 48 positions shown; findings below may reference images not displayed]

FINDINGS: Brain: No acute infarct, mass effect or extra-axial collection. No
acute or chronic hemorrhage. Hyperintense T2-weighted signal is
widespread throughout the white matter. Diffuse, severe atrophy. Old
infarct of the periventricular right frontal white matter. The
midline structures are normal.

Vascular: Major flow voids are preserved.

Skull and upper cervical spine: Normal calvarium and skull base.
Visualized upper cervical spine and soft tissues are normal.

Sinuses/Orbits:No paranasal sinus fluid levels or advanced mucosal
thickening. No mastoid or middle ear effusion. Normal orbits.
IMPRESSION: 1. No acute intracranial abnormality.
2. Diffuse, severe atrophy and chronic ischemic microangiopathy.
